# Patient Record
Sex: Male | Born: 1980 | Race: Asian | Hispanic: No | Marital: Single | Smoking: Current every day smoker
Health system: Southern US, Community
[De-identification: ages and names within clinical notes are randomized; demographics above are authoritative.]

## PROBLEM LIST (undated history)

## (undated) DIAGNOSIS — E119 Type 2 diabetes mellitus without complications: Secondary | ICD-10-CM

## (undated) DIAGNOSIS — E785 Hyperlipidemia, unspecified: Secondary | ICD-10-CM

## (undated) DIAGNOSIS — F200 Paranoid schizophrenia: Secondary | ICD-10-CM

## (undated) DIAGNOSIS — I10 Essential (primary) hypertension: Secondary | ICD-10-CM

## (undated) DIAGNOSIS — K5900 Constipation, unspecified: Secondary | ICD-10-CM

## (undated) HISTORY — DX: Hyperlipidemia, unspecified: E78.5

## (undated) HISTORY — DX: Essential (primary) hypertension: I10

## (undated) HISTORY — PX: NO PAST SURGERIES: SHX2092

---

## 2004-11-17 ENCOUNTER — Emergency Department (HOSPITAL_COMMUNITY): Admission: EM | Admit: 2004-11-17 | Discharge: 2004-11-17 | Payer: Self-pay | Admitting: *Deleted

## 2012-06-01 ENCOUNTER — Encounter (HOSPITAL_COMMUNITY): Payer: Self-pay

## 2012-06-01 ENCOUNTER — Emergency Department (HOSPITAL_COMMUNITY)
Admission: EM | Admit: 2012-06-01 | Discharge: 2012-06-01 | Disposition: A | Discharge status: Home / Self Care | Payer: Medicaid Other | Source: Home / Self Care | Attending: Emergency Medicine | Admitting: Emergency Medicine

## 2012-06-01 DIAGNOSIS — K529 Noninfective gastroenteritis and colitis, unspecified: Secondary | ICD-10-CM

## 2012-06-01 DIAGNOSIS — K219 Gastro-esophageal reflux disease without esophagitis: Secondary | ICD-10-CM

## 2012-06-01 MED ORDER — OMEPRAZOLE 20 MG PO CPDR: 40.0000 mg | DELAYED_RELEASE_CAPSULE | Freq: Two times a day (BID) | ORAL | Status: DC

## 2012-06-01 MED ORDER — ONDANSETRON 8 MG PO TBDP: 8.0000 mg | ORAL_TABLET | Freq: Three times a day (TID) | ORAL | Status: AC | PRN

## 2012-06-01 NOTE — ED Provider Notes (Addendum)
Chief Complaint  Patient presents with  . Abdominal Pain    History of Present Illness:    The patient is a 31 year old male with a 2 month history of indigestion, heartburn, and waterbrash. This is worse if he drinks sodas or eating certain foods such as bread. He denies any dysphagia or evidence of GI bleeding. He has mild epigastric pain but it's not severe. Over the past 2 days he's felt a little bit worse, feeling feverish and sweaty, nauseated, and dry heaves, diarrhea with up to 6 diarrheal stools per day which are somewhat dark. His epigastric pain feels a little bit worse. He's had no exposures to anyone with a similar illness, no suspicious ingestions, foreign travel, or animal exposures. He denies any history of ulcer disease, hiatal hernia, reflux esophagitis, gastritis, or gallbladder disease.  Review of Systems:  Other than noted above, the patient denies any of the following symptoms: Constitutional:  No fever, chills, fatigue, weight loss or anorexia. Lungs:  No cough or shortness of breath. Heart:  No chest pain, palpitations, syncope or edema.  No cardiac history. Abdomen:  No nausea, vomiting, hematememesis, melena, diarrhea, or hematochezia. GU:  No dysuria, frequency, urgency, or hematuria.  No testicular pain or swelling. Skin:  No rash or itching.  PMFSH:  Past medical history, family history, social history, meds, and allergies were reviewed along with nurse's notes.  No prior abdominal surgeries or history of GI problems.  No use of NSAIDs or aspirin.  No excessive  alcohol intake.  Physical Exam:   Vital signs:  BP 113/72  Pulse 91  Temp 98.5 F (36.9 C) (Oral)  Resp 16  SpO2 98% Gen:  Alert, oriented, in no distress. Lungs:  Breath sounds clear and equal bilaterally.  No wheezes, rales or rhonchi. Heart:  Regular rhythm.  No gallops or murmers.   Abdomen:  Abdomen is soft, flat, and nondistended. He has mild epigastric tenderness to palpation without guarding or  rebound. No organomegaly or mass. Bowel sounds are normally active. Murphy sign and Murphy's punch were negative. Rectal exam:  Rectal exam reveals no masses, normal prostate, no tenderness, and heme-negative stool. Skin:  Clear, warm and dry.  No rash.  Assessment:  The primary encounter diagnosis was GERD (gastroesophageal reflux disease). A diagnosis of Gastroenteritis was also pertinent to this visit. I think he has a chronic reflux with a superimposed acute gastroenteritis. I have urged that he followup with his primary care physician and given instructions about the brat diet and an antireflux regimen. I told him that his symptoms didn't improve after 6-8 weeks he may need to see a gastroenterologist.  Plan:   1.  The following meds were prescribed:   New Prescriptions   OMEPRAZOLE (PRILOSEC) 20 MG CAPSULE    Take 2 capsules (40 mg total) by mouth 2 (two) times daily before a meal.   ONDANSETRON (ZOFRAN ODT) 8 MG DISINTEGRATING TABLET    Take 1 tablet (8 mg total) by mouth every 8 (eight) hours as needed for nausea.   2.  The patient was instructed in symptomatic care and handouts were given. 3.  The patient was told to return if becoming worse in any way, if no better in 3 or 4 days, and given some red flag symptoms that would indicate earlier return.   Reuben Likes, MD 06/01/12 1253  Reuben Likes, MD 06/01/12 1254

## 2012-06-01 NOTE — ED Notes (Addendum)
C/o burning in epigastric area that is constant and aggravated by some foods (bread and soda)- Sx for 3 months.  States zantac makes it better.  In the last 2 days pt also having nausea, more frequent BMs and feeling fatigued.  Denies vomiting.

## 2012-06-01 NOTE — Discharge Instructions (Signed)
B.R.A.T. Diet Your doctor has recommended the B.R.A.T. diet for you or your child until the condition improves. This is often used to help control diarrhea and vomiting symptoms. If you or your child can tolerate clear liquids, you may have:  Bananas.   Rice.   Applesauce.   Toast (and other simple starches such as crackers, potatoes, noodles).  Be sure to avoid dairy products, meats, and fatty foods until symptoms are better. Fruit juices such as apple, grape, and prune juice can make diarrhea worse. Avoid these. Continue this diet for 2 days or as instructed by your caregiver. Document Released: 11/18/2005 Document Revised: 11/07/2011 Document Reviewed: 05/07/2007 [facility] Patient Information 2012 [place], [place].   Diet for GERD or PUD Nutrition therapy can help ease the discomfort of gastroesophageal reflux disease (GERD) and peptic ulcer disease (PUD).  HOME CARE INSTRUCTIONS   Eat your meals slowly, in a relaxed setting.   Eat 5 to 6 small meals per day.   If a food causes distress, stop eating it for a period of time.  FOODS TO AVOID  Coffee, regular or decaffeinated.   Cola beverages, regular or low calorie.   Tea, regular or decaffeinated.   Pepper.   Cocoa.   High fat foods, including meats.   Butter, margarine, hydrogenated oil (trans fats).   Peppermint or spearmint (if you have GERD).   Fruits and vegetables if not tolerated.   Alcohol.   Nicotine (smoking or chewing). This is one of the most potent stimulants to acid production in the gastrointestinal tract.   Any food that seems to aggravate your condition.  If you have questions regarding your diet, ask your caregiver or a registered dietitian. TIPS  Lying flat may make symptoms worse. Keep the head of your bed raised 6 to 9 inches (15 to 23 cm) by using a foam wedge or blocks under the legs of the bed.   Do not lay down until 3 hours after eating a meal.   Daily physical activity may help  reduce symptoms.  MAKE SURE YOU:   Understand these instructions.   Will watch your condition.   Will get help right away if you are not doing well or get worse.  Document Released: 11/18/2005 Document Revised: 11/07/2011 Document Reviewed: 10/04/2011 [facility] Patient Information 2012 [place], [place].

## 2020-03-06 ENCOUNTER — Emergency Department (HOSPITAL_BASED_OUTPATIENT_CLINIC_OR_DEPARTMENT_OTHER)
Admission: EM | Admit: 2020-03-06 | Discharge: 2020-03-07 | Disposition: A | Discharge status: Home / Self Care | Payer: Self-pay | Attending: Emergency Medicine | Admitting: Emergency Medicine

## 2020-03-06 ENCOUNTER — Other Ambulatory Visit: Payer: Self-pay

## 2020-03-06 ENCOUNTER — Encounter (HOSPITAL_BASED_OUTPATIENT_CLINIC_OR_DEPARTMENT_OTHER): Payer: Self-pay | Admitting: *Deleted

## 2020-03-06 DIAGNOSIS — Z79899 Other long term (current) drug therapy: Secondary | ICD-10-CM | POA: Insufficient documentation

## 2020-03-06 DIAGNOSIS — F309 Manic episode, unspecified: Secondary | ICD-10-CM

## 2020-03-06 DIAGNOSIS — F250 Schizoaffective disorder, bipolar type: Secondary | ICD-10-CM | POA: Insufficient documentation

## 2020-03-06 DIAGNOSIS — Z87891 Personal history of nicotine dependence: Secondary | ICD-10-CM | POA: Insufficient documentation

## 2020-03-06 DIAGNOSIS — Z20822 Contact with and (suspected) exposure to covid-19: Secondary | ICD-10-CM | POA: Insufficient documentation

## 2020-03-06 HISTORY — DX: Paranoid schizophrenia: F20.0

## 2020-03-06 LAB — URINALYSIS, ROUTINE W REFLEX MICROSCOPIC
Bilirubin Urine: NEGATIVE
Glucose, UA: NEGATIVE mg/dL
Ketones, ur: NEGATIVE mg/dL
Leukocytes,Ua: NEGATIVE
Nitrite: NEGATIVE
Protein, ur: NEGATIVE mg/dL
Specific Gravity, Urine: 1.015 (ref 1.005–1.030)
pH: 6 (ref 5.0–8.0)

## 2020-03-06 LAB — URINALYSIS, MICROSCOPIC (REFLEX): Squamous Epithelial / LPF: NONE SEEN (ref 0–5)

## 2020-03-06 LAB — RAPID URINE DRUG SCREEN, HOSP PERFORMED
Amphetamines: NOT DETECTED
Barbiturates: NOT DETECTED
Benzodiazepines: NOT DETECTED
Cocaine: NOT DETECTED
Opiates: NOT DETECTED
Tetrahydrocannabinol: NOT DETECTED

## 2020-03-06 MED ORDER — ZIPRASIDONE MESYLATE 20 MG IM SOLR
20.0000 mg | Freq: Once | INTRAMUSCULAR | Status: AC
  Administered 2020-03-06: 20 mg via INTRAMUSCULAR
  Filled 2020-03-06: qty 20

## 2020-03-06 MED ORDER — HALOPERIDOL 5 MG PO TABS
5.0000 mg | ORAL_TABLET | Freq: Once | ORAL | Status: AC
  Administered 2020-03-06: 5 mg via ORAL
  Filled 2020-03-06: qty 1

## 2020-03-06 MED ORDER — LORAZEPAM 2 MG/ML IJ SOLN
2.0000 mg | Freq: Once | INTRAMUSCULAR | Status: AC
  Administered 2020-03-06: 2 mg via INTRAMUSCULAR
  Filled 2020-03-06: qty 1

## 2020-03-06 NOTE — ED Notes (Signed)
1 bag of pt belongings put in cab by RT gym shorts, white tshirt & puma flip flops.

## 2020-03-06 NOTE — BH Assessment (Addendum)
Tele Assessment Note   Patient Name: Steven Richardson MRN: [id number] Referring Physician: Dr. Virgina Norfolk.  Location [facility]: [facility] [place]. Location of Provider: Behavioral Health TTS Department  Steven Richardson is an 39 y.o. male, who presents involuntary and unaccompanied to [facility]. Clinician asked the pt, "what brought you to the hospital?" Pt reported, his wife brought him to the hospital because he was thinking about weird stuff. Pt reported, he told his wife he wanted multiple wives but his wife objected. Pt reported, he promised his wife she will be his only wife. Pt reported, seeing things in the past but not currently. Pt denies, SI, HI, AVH, self-injurious behaviors and access to weapons.   Pt was IVC'd by EDP. Per IVC paperwork: "Patient with history of Schizophrenia at worsening manic symptoms hallucinations even with recent medication adjustment, [facility] ACT Team, recommended evaluation and likely admission for stabilization."   Pt denies, substance use. Pt's UDS is negative. Pt is linked to ACT Team for medication management. Pt listed Depakote as a medications he is prescribed. Pt reported, taking medications as prescribed. Per chart, pt has previous inpatient admissions.   Pt presents alert in scrubs with logical, coherent speech. Pt's eye contact was good. Pt's mood, affect was pleasant. Pt's thought process was circumstantial. Pt's judgement is partial. Pt's concentration is fair. Pt's insight and impulse control was poor. Pt reported, if discharged from [facility] he could contract for safety.   Diagnosis: Schizoaffective disorder, bipolar type [facility])  Past Medical History:  Past Medical History:  Diagnosis Date  . Paranoid schizophrenia (HCC)     History reviewed. No pertinent surgical history.  Family History: No family history on file.  Social History:  reports that he has quit smoking. He has never used smokeless tobacco. He  reports that he does not drink alcohol or use drugs.  Additional Social History:  Alcohol / Drug Use Pain Medications: See MAR Prescriptions: See MAR Over the Counter: See MAR History of alcohol / drug use?: No history of alcohol / drug abuse  CIWA: CIWA-Ar BP: 118/77 Pulse Rate: 97 COWS:    Allergies: No Known Allergies  Home Medications: (Not in a hospital admission)   OB/GYN Status:  No LMP for male patient.  General Assessment Data Assessment unable to be completed: Yes Reason for not completing assessment: IVC paperwork to be faxed. Once paperwork is received and reviewed clinician to engage pt in TTS assessment.  Location of Assessment: [facility] TTS Assessment: In system Is this a Tele or Face-to-Face Assessment?: Tele Assessment Is this an Initial Assessment or a Re-assessment for this encounter?: Initial Assessment Patient Accompanied by:: Adult Permission Given to speak with another: Yes Name, Relationship and Phone Number: Steven Richardson, mother, [phone]. Language Other than English: Yes What is your preferred language: Other (Comment: Enter the language)(Arabic.) Living Arrangements: Other (Comment)(Wife. ) What gender do you identify as?: Male Marital status: Married Living Arrangements: Spouse/significant other Can pt return to current living arrangement?: Yes Admission Status: Voluntary Is patient capable of signing voluntary admission?: Yes Referral Source: Self/Family/Friend Insurance type: Self-pay.     Crisis Care Plan Living Arrangements: Spouse/significant other Legal Guardian: (self.) Name of Psychiatrist: ACT Team Name of Therapist: NA  Education Status Is patient currently in school?: No Is the patient employed, unemployed or receiving disability?: Receiving disability income(Pending. )  Risk to self with the past 6 months Suicidal Ideation: No(Pt denies. ) Has patient  been a risk to self within the past 6 months prior  to admission? : No(Pt denies. ) Suicidal Intent: No Has patient had any suicidal intent within the past 6 months prior to admission? : No Is patient at risk for suicide?: No Suicidal Plan?: No Has patient had any suicidal plan within the past 6 months prior to admission? : No Access to Means: No What has been your use of drugs/alcohol within the last 12 months?: Pt denies use.  Previous Attempts/Gestures: Yes How many times?: (Unsure.) Other Self Harm Risks: Per IVC, manic symptoms, hallucinations Triggers for Past Attempts: (UTA) Intentional Self Injurious Behavior: None(Pt denies. ) Family Suicide History: Unable to assess Recent stressful life event(s): Other (Comment)(Pt denies, stressors. ) Persecutory voices/beliefs?: No(Pt denies. ) Depression: No(Pt denies. ) Substance abuse history and/or treatment for substance abuse?: No Suicide prevention information given to non-admitted patients: Not applicable  Risk to Others within the past 6 months Homicidal Ideation: No(Pt denies. ) Does patient have any lifetime risk of violence toward others beyond the six months prior to admission? : No Thoughts of Harm to Others: No Current Homicidal Intent: No Current Homicidal Plan: No Access to Homicidal Means: No Identified Victim: NA History of harm to others?: No Assessment of Violence: None Noted Violent Behavior Description: NA Does patient have access to weapons?: No Criminal Charges Pending?: No Does patient have a court date: No Is patient on probation?: No  Psychosis Hallucinations: Auditory Delusions: None noted  Mental Status Report Appearance/Hygiene: In scrubs Eye Contact: Good Motor Activity: Unremarkable Speech: Logical/coherent Level of Consciousness: Alert Mood: Pleasant Affect: Other (Comment)(pleasant. ) Anxiety Level: Moderate Thought Processes: Circumstantial Judgement: Partial Orientation: Person, Place, Time, Situation Obsessive Compulsive  Thoughts/Behaviors: None  Cognitive Functioning Concentration: Fair Memory: Recent Impaired Is patient IDD: No Insight: Poor Impulse Control: Poor Appetite: Good Sleep: (Depends.) Vegetative Symptoms: None  ADLScreening [facility] Assessment Services) Patient's cognitive ability adequate to safely complete daily activities?: Yes Patient able to express need for assistance with ADLs?: Yes Independently performs ADLs?: Yes (appropriate for developmental age)  Prior Inpatient Therapy Prior Inpatient Therapy: Yes Prior Therapy Dates: 01/2020 Prior Therapy Facilty/Provider(s): [facility].  Reason for Treatment: Schizoaffective disorder, bipolar type .  Prior Outpatient Therapy Prior Outpatient Therapy: Yes Prior Therapy Dates: Current.  Prior Therapy Facilty/Provider(s): [facility]. Reason for Treatment: Medication management. Does patient have an ACCT team?: No Does patient have Intensive In-House Services?  : No Does patient have [facility] services? : No Does patient have P4CC services?: No  ADL Screening (condition at time of admission) Patient's cognitive ability adequate to safely complete daily activities?: Yes Is the patient deaf or have difficulty hearing?: No Does the patient have difficulty seeing, even when wearing glasses/contacts?: No Does the patient have difficulty concentrating, remembering, or making decisions?: No Patient able to express need for assistance with ADLs?: Yes Does the patient have difficulty dressing or bathing?: No Independently performs ADLs?: Yes (appropriate for developmental age) Does the patient have difficulty walking or climbing stairs?: No Weakness of Legs: None Weakness of Arms/Hands: None  Home Assistive Devices/Equipment Home Assistive Devices/Equipment: None    Abuse/Neglect Assessment (Assessment to be complete while patient is alone) Abuse/Neglect Assessment Can Be Completed: Yes Physical Abuse: Denies Verbal Abuse:  Yes, past (Comment) Sexual Abuse: Denies Exploitation of patient/patient's resources: Denies Self-Neglect: Denies     Regulatory affairs officer (For Healthcare) Does Patient Have a Medical Advance Directive?: No          Disposition: Corene Cornea  Allyson Sabal, NP recommends inpatient treatment. Per Hassie Bruce, RN no appropriate beds available. TTS to seek placement. Disposition discussed with Dr. Clayborne Dana and Victorino Dike, RN.    Disposition Initial Assessment Completed for this Encounter: Yes  This service was provided via telemedicine using a 2-way, interactive audio and video technology.  Names of all persons participating in this telemedicine service and their role in this encounter. Name: Steven Richardson. Role: Patient.  Name: Redmond Pulling, MS, [facility], [facility]. Role: Counselor.          Redmond Pulling 03/06/2020 11:58 PM    Redmond Pulling, MS, [facility], [facility] Triage Specialist [phone]

## 2020-03-06 NOTE — ED Notes (Signed)
Pt sexually explicit towards this nurse, making gestures to get into bed with him and asking to kiss. Attempted to redirect with television.

## 2020-03-06 NOTE — B[facility] Counselor (Signed)
Clinician obtained consent to call pt's wife Wynelle Link, [phone]) to obtain collateral information hoever there was no answer. Clinician left a HIPPA compliant voice message.    Redmond Pulling, MS, [facility], [facility] Triage Specialist [phone].

## 2020-03-06 NOTE — ED Triage Notes (Signed)
Pt denies SI, HI. Attempted to take pt BP he states I dont want you to take it, when ED MD asks about pt history, he reports he does not want to talk, states "my history is not your concern". Pt. laughing  Pt states you can speak with my wife.

## 2020-03-06 NOTE — ED Notes (Addendum)
PT repeatedly asking to leave, making derogatory comments to staff, coming to doorway, ignoring requests. PT cursing, and talking to tv. PT stated multiple times he would touch staff but was verbally deescalated. PT has not been wanded or changed at this time due to ignoring requests from staff and no IVC papers at this time. PT requesting to leave or do what he needs to leave. MD, RN aware

## 2020-03-06 NOTE — ED Triage Notes (Signed)
Pt brought in by wife for AMS. Pt gives first name, reports he does not have a last name, when asked why he is here he reports that he is great. Denies any pain. ED MD at beside.

## 2020-03-06 NOTE — ED Notes (Signed)
His wife Evlyn Kanner can be reached at [phone]. She is waiting in the W/A.

## 2020-03-06 NOTE — ED Triage Notes (Signed)
He has been hearing voices since December. He is taking Depakote and lithium for schizophrenia. Wife states he is scaring his kids. He tells them he is God. He went to through the neighborhood today asking people if could come in their homes to "pee". He is very loud and demanding. No physical abuse.

## 2020-03-06 NOTE — ED Notes (Signed)
PT belongings collected and placed at the nurses station. PT asleep at this time and will be wanded by security as soon as the PT is awake. Rails up, bed in lowest position. Room secured per protocols.

## 2020-03-06 NOTE — ED Provider Notes (Signed)
[facility] EMERGENCY DEPARTMENT Provider Note   CSN: [id number] Arrival date & time: 03/06/20  1748     History Chief Complaint  Patient presents with  . Mental Health Problem    Steven Richardson is a 39 y.o. male.  Patient with history of schizophrenia.  Recent medication change several weeks ago due to having manic type behavior.  Patient not doing better.  Hyperreligious, hypersexual, some hallucinations.  Sent by his act team for possible admission.  Patient denies any suicidal homicidal ideation.  The history is provided by the patient and the spouse.  Mental Health Problem Presenting symptoms: disorganized speech, disorganized thought process and hallucinations   Patient accompanied by:  Family member Degree of incapacity (severity):  Moderate Onset quality:  Gradual Timing:  Constant Progression:  Worsening Chronicity:  Recurrent Context: not noncompliant   Treatment compliance:  All of the time Relieved by:  Nothing Worsened by:  Nothing Associated symptoms: no abdominal pain and no chest pain   Risk factors: hx of mental illness        Past Medical History:  Diagnosis Date  . Paranoid schizophrenia (HCC)     There are no problems to display for this patient.   History reviewed. No pertinent surgical history.     No family history on file.  Social History   Tobacco Use  . Smoking status: Former Games developer  . Smokeless tobacco: Never Used  Substance Use Topics  . Alcohol use: No  . Drug use: No    Home Medications Prior to Admission medications   Medication Sig Start Date End Date Taking? Authorizing Provider  lithium carbonate (LITHOBID) 300 MG CR tablet Take by mouth. 02/20/20 03/21/20 Yes [provider]  divalproex (DEPAKOTE ER) 500 MG 24 hr tablet Take 2,000 mg by mouth at bedtime. 01/31/20   [provider]  lithium carbonate (LITHOBID) 300 MG CR tablet Take 300 mg by mouth 2 (two) times daily. 02/21/20   [provider]  omeprazole (PRILOSEC) 20 MG capsule Take 2 capsules (40 mg total) by mouth 2 (two) times daily before a meal. 06/01/12 06/01/13  Reuben Likes, MD    Allergies    Patient has no known allergies.  Review of Systems   Review of Systems  Constitutional: Negative for chills and fever.  HENT: Negative for ear pain and sore throat.   Eyes: Negative for pain and visual disturbance.  Respiratory: Negative for cough and shortness of breath.   Cardiovascular: Negative for chest pain and palpitations.  Gastrointestinal: Negative for abdominal pain and vomiting.  Genitourinary: Negative for dysuria and hematuria.  Musculoskeletal: Negative for arthralgias and back pain.  Skin: Negative for color change and rash.  Neurological: Negative for seizures and syncope.  Psychiatric/Behavioral: Positive for decreased concentration and hallucinations. The patient is hyperactive.   All other systems reviewed and are negative.   Physical Exam Updated Vital Signs  ED Triage Vitals  Enc Vitals Group     BP 03/06/20 1837 (!) 131/114     Pulse Rate 03/06/20 1837 89     Resp 03/06/20 1837 (!) 21     Temp --      Temp src --      SpO2 03/06/20 1837 100 %     Weight 03/06/20 1804 280 lb (127 kg)     Height 03/06/20 1804 6\' 2"  (1.88 m)     Head Circumference --      Peak Flow --  Pain Score 03/06/20 1800 0     Pain Loc --      Pain Edu? --      Excl. in [place]? --     Physical Exam Vitals and nursing note reviewed.  Constitutional:      General: He is not in acute distress.    Appearance: He is well-developed. He is not ill-appearing.  HENT:     Head: Normocephalic and atraumatic.     Nose: Nose normal.     Mouth/Throat:     Mouth: Mucous membranes are moist.  Eyes:     Extraocular Movements: Extraocular movements intact.     Conjunctiva/sclera: Conjunctivae normal.     Pupils: Pupils are equal, round, and reactive to light.  Cardiovascular:     Rate and Rhythm: Normal rate  and regular rhythm.     Pulses: Normal pulses.     Heart sounds: Normal heart sounds. No murmur.  Pulmonary:     Effort: Pulmonary effort is normal. No respiratory distress.     Breath sounds: Normal breath sounds.  Abdominal:     Palpations: Abdomen is soft.     Tenderness: There is no abdominal tenderness.  Musculoskeletal:     Cervical back: Neck supple.  Skin:    General: Skin is warm and dry.  Neurological:     General: No focal deficit present.     Mental Status: He is alert and oriented to person, place, and time.     Cranial Nerves: No cranial nerve deficit.     Sensory: No sensory deficit.     Motor: No weakness.     Coordination: Coordination normal.     Gait: Gait normal.  Psychiatric:        Attention and Perception: He is inattentive.        Mood and Affect: Affect is labile.        Speech: Speech is tangential.        Behavior: Behavior is hyperactive.        Thought Content: Thought content does not include homicidal or suicidal ideation. Thought content does not include homicidal or suicidal plan.        Judgment: Judgment is impulsive.     ED Results / Procedures / Treatments   Labs (all labs ordered are listed, but only abnormal results are displayed) Labs Reviewed  URINALYSIS, ROUTINE W REFLEX MICROSCOPIC - Abnormal; Notable for the following components:      Result Value   Hgb urine dipstick TRACE (*)    All other components within normal limits  URINALYSIS, MICROSCOPIC (REFLEX) - Abnormal; Notable for the following components:   Bacteria, UA RARE (*)    All other components within normal limits  RESPIRATORY PANEL BY RT PCR (FLU A&B, COVID)  RAPID URINE DRUG SCREEN, HOSP PERFORMED  COMPREHENSIVE METABOLIC PANEL  ETHANOL  CBC WITH DIFFERENTIAL/PLATELET    EKG None  Radiology No results found.  Procedures Procedures (including critical care time)  Medications Ordered in ED Medications  haloperidol (HALDOL) tablet 5 mg (5 mg Oral Given  03/06/20 1847)  LORazepam (ATIVAN) injection 2 mg (2 mg Intramuscular Given 03/06/20 2000)  ziprasidone (GEODON) injection 20 mg (20 mg Intramuscular Given 03/06/20 2000)    ED Course  I have reviewed the triage vital signs and the nursing notes.  Pertinent labs & imaging results that were available during my care of the patient were reviewed by me and considered in my medical decision making (see chart for details).  MDM Rules/Calculators/A&P                      Steven Richardson is a 39 year old male with history of schizophrenia who presents to the ED with manic behavior.  Patient here with his wife.  Patient's act team recommended likely inpatient treatment.  Has been compliant with his medications.  Recent changes medication 3 weeks ago as he has not been doing well mentally.  However wife states that he continues to be hyperreligious, hypersexual, manic.  He exhibits these behaviors on exam as well.  Denies any suicidal homicidal ideation.  Patient was given IM Geodon and Ativan for agitation upon arrival for safety of the staff and patient.  Urine drug screen is negative.  Urinalysis unremarkable.  TTS consult has been placed.  IVC has been filled as patient has been trying to leave.  This chart was dictated using voice recognition software.  Despite best efforts to proofread,  errors can occur which can change the documentation meaning.    Final Clinical Impression(s) / ED Diagnoses Final diagnoses:  Mania [facility])    Rx / DC Orders ED Discharge Orders    None       Virgina Norfolk, DO 03/06/20 2056

## 2020-03-06 NOTE — ED Notes (Signed)
PT changed into scrubs, PT still awake and uncooperative but is not aggressive at this time

## 2020-03-06 NOTE — ED Notes (Signed)
[facility] recommended pt for inpatient placement, stated they would try to find appropriate placement for him.

## 2020-03-06 NOTE — [facility] Counselor (Signed)
IVC paperwork to be faxed. Once paperwork is received and reviewed clinician to engage pt in TTS assessment.    Redmond Pulling, MS, [facility], [facility] Triage Specialist [phone]

## 2020-03-06 NOTE — ED Notes (Signed)
Attempted to deescalate pt, continues to be verbally aggressive towards sitter & sexually explicit towards RN. Unable to redirect at this time. Is not becoming physical hitting things in room.

## 2020-03-07 LAB — RESPIRATORY PANEL BY RT PCR (FLU A&B, COVID)
Influenza A by PCR: NEGATIVE
Influenza B by PCR: NEGATIVE
SARS Coronavirus 2 by RT PCR: NEGATIVE

## 2020-03-07 NOTE — ED Notes (Signed)
TTS done 

## 2020-03-07 NOTE — ED Notes (Signed)
Introduced myself to the patient and advised him that I will be helping to take care of him. Also asked if I could get him anything? Pt replied we were going home.

## 2020-03-07 NOTE — ED Notes (Signed)
Patient is pleasantly ambulating by the door

## 2020-03-07 NOTE — ED Notes (Signed)
Patient is still pleasantly ambulating by the door

## 2020-03-07 NOTE — ED Notes (Signed)
Spoke with pt's wife and she will arrive around 16:00-17:00 to pick up pt.

## 2020-03-07 NOTE — ED Provider Notes (Signed)
Brief update note  39 year old male with history of schizophrenia presenting to ER due to manic type behavior.  Hyperreligious, hypersexual.  Sent by act team for possible admission.  IVC'd.  TTS evaluated patient overnight, recommended reeval next morning.  On reevaluation today, Rosario Adie NP reassessed, feels he has improved today and is stable for discharge.  Personally assessed patient, demonstrates odd behavior, nursing staff reported that he had inappropriate conversation towards male staff, flirting behavoir. He however remains redirectable, has expressed no thoughts of self harm or harm to others. I agree with TTS/Psych NP eval. Will rescind IVC. Patient to f/u out pt with his ACT team. To be discharged to his family.    Milagros Loll, MD 03/07/20 1431

## 2020-03-07 NOTE — ED Notes (Signed)
Pt continues to make inappropriate conversation toward male staff, flirting type behaviour.   Pt standing at door and then sitting in bed.  Pt laughing to himself in bed, talking to himself, "you're crazy".

## 2020-03-07 NOTE — ED Notes (Signed)
Pt's IVC has been rescinded. Left voicemail for pt's wife notifying her of pt's pending discharge.

## 2020-03-07 NOTE — ED Notes (Signed)
Assisted pt with getting dressed.

## 2020-03-07 NOTE — ED Notes (Signed)
Pt sitting outside of room and then walking around , was  Reminded that he had to stay  in room or seat outside room, pt given a drink

## 2020-03-07 NOTE — Consult Note (Signed)
Telepsych Consultation   Reason for Consult:  IVC'd Referring Physician:  EDP at [facility] Location of Patient: [facility]  Location of Provider: Behavioral Health TTS Department  Patient Identification: Steven Richardson MRN:  [id number] Principal Diagnosis: <principal problem not specified> Diagnosis:  Active Problems:   * No active hospital problems. *   Total Time spent with patient: 45 minutes  Subjective:   Steven Richardson is a 39 y.o. male patient admitted with bizzare behavior and IVC by his wife.  Per IVC paperwork " Patient with history of schizophrenia at worsening manic symptoms hallucinations even with recent medications adjustment, [facility] ACT team recommend evaluation and likely admission for stabilization. "  Patient seen today for re-evaluation. Patient states I am here because my wife brought me here. I went to the neighbors house to find a 2nd wife. Well not to find a 2nd wife, to find a girlfriend you know. My wife got mad said I was crazy and brought me here. Is there anything wrong with having a wife and a girlfriend? I will ask my mom she can tell me. It is best that I ask my wife first. "    Patient appears to be improved today. He is not as restless, pacing, or hyperverbal today. He remains inappropriate at times when speaking with Clinical research associate, however he is verbally redirectable and not intrusive at all. He denies suicidal ideations, homicidal ideations and or hallucinations. He denies any recent hospitalizations however per chart review patietn was discharged from [facility] on 03/21 (LOS 10 days).    HPI:  Steven Richardson is an 39 y.o. male, who presents involuntary and unaccompanied to [facility] [employer] [facility]. Clinician asked the pt, "what brought you to the hospital?" Pt reported, his wife brought him to the hospital because he was thinking about weird stuff. Pt reported, he told his wife he wanted multiple wives but his wife objected. Pt reported, he promised his wife she will be his only wife. Pt  reported, seeing things in the past but not currently. Pt denies, SI, HI, AVH, self-injurious behaviors and access to weapons.   Pt was IVC'd by EDP. Per IVC paperwork: "Patient with history of Schizophrenia at worsening manic symptoms hallucinations even with recent medication adjustment, [facility] ACT Team, recommended evaluation and likely admission for stabilization."   Pt denies, substance use. Pt's UDS is negative. Pt is linked to ACT Team for medication management. Pt listed Depakote as a medications he is prescribed. Pt reported, taking medications as prescribed. Per chart, pt has previous inpatient admissions.    Past Psychiatric History:  Outpatient : [facility] ACT team Inpatient: [facility],  Medications: Depakote 1000mg  po BID, Lithium 300mg  po BID , Paliperidone oral 12mg  po daily and Sustenna (3/19 last received)   Risk to Self: Suicidal Ideation: No(Pt denies. ) Suicidal Intent: No Is patient at risk for suicide?: No Suicidal Plan?: No Access to Means: No What has been your use of drugs/alcohol within the last 12 months?: Pt denies use.  How many times?: (Unsure.) Other Self Harm Risks: Per IVC, manic symptoms, hallucinations Triggers for Past Attempts: (UTA) Intentional Self Injurious Behavior: None(Pt denies. ) Risk to Others: Homicidal Ideation: No(Pt denies. ) Thoughts of Harm to Others: No Current Homicidal Intent: No Current Homicidal Plan: No Access to Homicidal Means: No Identified Victim: NA History of harm to others?: No Assessment of Violence: None Noted Violent Behavior Description: NA Does patient have access to weapons?: No Criminal Charges Pending?: No Does patient have a  court date: No Prior Inpatient Therapy: Prior Inpatient Therapy: Yes Prior Therapy Dates: 01/2020 Prior Therapy Facilty/Provider(s): [facility].  Reason for Treatment: Schizoaffective disorder, bipolar type . Prior Outpatient Therapy: Prior Outpatient Therapy: Yes Prior Therapy  Dates: Current.  Prior Therapy Facilty/Provider(s): [facility] Team. Reason for Treatment: Medication management. Does patient have an ACCT team?: No Does patient have Intensive In-House Services?  : No Does patient have [facility] services? : No Does patient have P4CC services?: No  Past Medical History:  Past Medical History:  Diagnosis Date  . Paranoid schizophrenia ([place])    History reviewed. No pertinent surgical history. Family History: No family history on file. Family Psychiatric  History: Denies Social History:  Social History   Substance and Sexual Activity  Alcohol Use No     Social History   Substance and Sexual Activity  Drug Use No    Social History   Socioeconomic History  . Marital status: Single    Spouse name: Not on file  . Number of children: Not on file  . Years of education: Not on file  . Highest education level: Not on file  Occupational History  . Not on file  Tobacco Use  . Smoking status: Former Research scientist (life sciences)  . Smokeless tobacco: Never Used  Substance and Sexual Activity  . Alcohol use: No  . Drug use: No  . Sexual activity: Not on file  Other Topics Concern  . Not on file  Social History Narrative  . Not on file   Social Determinants of Health   Financial Resource Strain:   . Difficulty of Paying Living Expenses:   Food Insecurity:   . Worried About Charity fundraiser in the Last Year:   . Arboriculturist in the Last Year:   Transportation Needs:   . Film/video editor (Medical):   Marland Kitchen Lack of Transportation (Non-Medical):   Physical Activity:   . Days of Exercise per Week:   . Minutes of Exercise per Session:   Stress:   . Feeling of Stress :   Social Connections:   . Frequency of Communication with Friends and Family:   . Frequency of Social Gatherings with Friends and Family:   . Attends Religious Services:   . Active Member of Clubs or Organizations:   . Attends Archivist Meetings:   Marland Kitchen Marital Status:    Additional  Social History:    Allergies:  No Known Allergies  Labs:  Results for orders placed or performed during the hospital encounter of 03/06/20 (from the past 48 hour(s))  Rapid urine drug screen (hospital performed)     Status: None   Collection Time: 03/06/20  6:57 PM  Result Value Ref Range   Opiates NONE DETECTED NONE DETECTED   Cocaine NONE DETECTED NONE DETECTED   Benzodiazepines NONE DETECTED NONE DETECTED   Amphetamines NONE DETECTED NONE DETECTED   Tetrahydrocannabinol NONE DETECTED NONE DETECTED   Barbiturates NONE DETECTED NONE DETECTED    Comment: (NOTE) DRUG SCREEN FOR MEDICAL PURPOSES ONLY.  IF CONFIRMATION IS NEEDED FOR ANY PURPOSE, NOTIFY LAB WITHIN 5 DAYS. LOWEST DETECTABLE LIMITS FOR URINE DRUG SCREEN Drug Class                     Cutoff (ng/mL) Amphetamine and metabolites    1000 Barbiturate and metabolites    200 Benzodiazepine                 027 Tricyclics and metabolites  300 Opiates and metabolites        300 Cocaine and metabolites        300 THC                            50 Performed at [facility], [address] [place], [place] [address]   Urinalysis, Routine w reflex microscopic     Status: Abnormal   Collection Time: 03/06/20  6:57 PM  Result Value Ref Range   Color, Urine YELLOW YELLOW   APPearance CLEAR CLEAR   Specific Gravity, Urine 1.015 1.005 - 1.030   pH 6.0 5.0 - 8.0   Glucose, UA NEGATIVE NEGATIVE mg/dL   Hgb urine dipstick TRACE (A) NEGATIVE   Bilirubin Urine NEGATIVE NEGATIVE   Ketones, ur NEGATIVE NEGATIVE mg/dL   Protein, ur NEGATIVE NEGATIVE mg/dL   Nitrite NEGATIVE NEGATIVE   Leukocytes,Ua NEGATIVE NEGATIVE    Comment: Performed at [facility], [address] [place], [place] [address]  Urinalysis, Microscopic (reflex)     Status: Abnormal   Collection Time: 03/06/20  6:57 PM  Result Value Ref Range   RBC / HPF 0-5 0 - 5 RBC/hpf   WBC, UA 0-5 0 - 5 WBC/hpf   Bacteria, UA RARE (A) NONE SEEN    Squamous Epithelial / LPF NONE SEEN 0 - 5    Comment: Performed at [facility], [address] [place], [place] [address]  Respiratory Panel by RT PCR (Flu A&B, Covid) - Nasopharyngeal Swab     Status: None   Collection Time: 03/07/20 12:32 AM   Specimen: Nasopharyngeal Swab  Result Value Ref Range   SARS Coronavirus 2 by RT PCR NEGATIVE NEGATIVE    Comment: (NOTE) SARS-CoV-2 target nucleic acids are NOT DETECTED. The SARS-CoV-2 RNA is generally detectable in upper respiratoy specimens during the acute phase of infection. The lowest concentration of SARS-CoV-2 viral copies this assay can detect is 131 copies/mL. A negative result does not preclude SARS-Cov-2 infection and should not be used as the sole basis for treatment or other patient management decisions. A negative result may occur with  improper specimen collection/handling, submission of specimen other than nasopharyngeal swab, presence of viral mutation(s) within the areas targeted by this assay, and inadequate number of viral copies (<131 copies/mL). A negative result must be combined with clinical observations, patient history, and epidemiological information. The expected result is Negative. Fact Sheet for Patients:  https://www.moore.com/ Fact Sheet for Healthcare Providers:  https://www.young.biz/ This test is not yet ap proved or cleared by the [place] FDA and  has been authorized for detection and/or diagnosis of SARS-CoV-2 by FDA under an Emergency Use Authorization (EUA). This EUA will remain  in effect (meaning this test can be used) for the duration of the COVID-19 declaration under Section 564(b)(1) of the Act, 21 U.S.C. section 360bbb-3(b)(1), unless the authorization is terminated or revoked sooner.    Influenza A by PCR NEGATIVE NEGATIVE   Influenza B by PCR NEGATIVE NEGATIVE    Comment: (NOTE) The Xpert Xpress SARS-CoV-2/FLU/RSV assay is intended as  an aid in  the diagnosis of influenza from Nasopharyngeal swab specimens and  should not be used as a sole basis for treatment. Nasal washings and  aspirates are unacceptable for Xpert Xpress SARS-CoV-2/FLU/RSV  testing. Fact Sheet for Patients: https://www.moore.com/ Fact Sheet for Healthcare Providers: https://www.young.biz/ This test is not yet approved or cleared by  the [employer] and  has been authorized for detection and/or diagnosis of SARS-CoV-2 by  FDA under an Emergency Use Authorization (EUA). This EUA will remain  in effect (meaning this test can be used) for the duration of the  Covid-19 declaration under Section 564(b)(1) of the Act, 21  U.S.C. section 360bbb-3(b)(1), unless the authorization is  terminated or revoked. Performed at [facility], [address] [place], [place] [address]     Medications:  No current facility-administered medications for this encounter.   Current Outpatient Medications  Medication Sig Dispense Refill  . lithium carbonate (LITHOBID) 300 MG CR tablet Take by mouth.    . divalproex (DEPAKOTE ER) 500 MG 24 hr tablet Take 2,000 mg by mouth at bedtime.    Marland Kitchen lithium carbonate (LITHOBID) 300 MG CR tablet Take 300 mg by mouth 2 (two) times daily.    Marland Kitchen omeprazole (PRILOSEC) 20 MG capsule Take 2 capsules (40 mg total) by mouth 2 (two) times daily before a meal. 60 capsule 1    Musculoskeletal: Strength & Muscle Tone: within normal limits Gait & Station: normal Patient leans: N/A  Psychiatric Specialty Exam: Physical Exam  Review of Systems  Blood pressure (!) 130/94, pulse 79, temperature 97.9 F (36.6 C), temperature source Oral, resp. rate 16, height 6\' 2"  (1.88 m), weight 127 kg, SpO2 100 %.Body mass index is 35.95 kg/m.  General Appearance: Fairly Groomed  Eye Contact:  Good  Speech:  Clear and Coherent and Normal Rate  Volume:  Normal  Mood:  Euthymic  Affect:  Appropriate,  Congruent and Flirtatious  Thought Process:  Coherent, Linear and Descriptions of Associations: Intact  Orientation:  Full (Time, Place, and Person)  Thought Content:  Logical and Rumination  Suicidal Thoughts:  No  Homicidal Thoughts:  No  Memory:  Immediate;   Good Recent;   Fair  Judgement:  Fair  Insight:  Shallow  Psychomotor Activity:  Normal  Concentration:  Concentration: Fair and Attention Span: Fair  Recall:  of Knowledge:  Fair  Language:  Fair  Akathisia:  No  Handed:  Right  AIMS (if indicated):     Assets:  Communication Skills Desire for Improvement  ADL's:  Intact  Cognition:  WNL  Sleep:        Treatment Plan Summary: Plan Psych clear and discharge back to his ACT team. He can continue his home medications at this time. Follow up with outpatient sevices [facility]. He is scheduled to receive his next LAI on 03/20/2020. At this time it is too early to administer. Patient has poor impulse control, however he has some fair judgement and insight.   Disposition: No evidence of imminent risk to self or others at present.   Patient does not meet criteria for psychiatric inpatient admission. Supportive therapy provided about ongoing stressors. Discussed crisis plan, support from social network, calling 911, coming to the Emergency Department, and calling Suicide Hotline.  This service was provided via telemedicine using a 2-way, interactive audio and video technology.  Names of all persons participating in this telemedicine service and their role in this encounter. Name: 03/22/2020 Role: PMHNP-BC  Name: Randol Hmedian Role: Patient     Caryn Bee, FNP 03/07/2020 1:06 PM

## 2020-03-07 NOTE — ED Notes (Signed)
Pt given oatmeal applesauce and jiuce for breakfast sitter arrived

## 2020-03-07 NOTE — Discharge Instructions (Signed)
Please follow-up with your primary psychiatric team in the outpatient setting.  If he develops any thoughts or actions of hurting himself, hurting others or other new concerning symptom, recommend return to ER for reassessment.

## 2020-03-07 NOTE — ED Notes (Signed)
Sitter relived for lunch. PT cooperative at this time. Given coke. PT in good spirits.

## 2020-03-18 ENCOUNTER — Other Ambulatory Visit: Payer: Self-pay

## 2020-03-18 ENCOUNTER — Encounter (HOSPITAL_COMMUNITY): Payer: Self-pay

## 2020-03-18 ENCOUNTER — Emergency Department (HOSPITAL_COMMUNITY)
Admission: EM | Admit: 2020-03-18 | Discharge: 2020-03-20 | Disposition: A | Discharge status: Other Acute Inpatient Hospital | Payer: Self-pay | Attending: Emergency Medicine | Admitting: Emergency Medicine

## 2020-03-18 DIAGNOSIS — F209 Schizophrenia, unspecified: Secondary | ICD-10-CM | POA: Insufficient documentation

## 2020-03-18 DIAGNOSIS — F23 Brief psychotic disorder: Secondary | ICD-10-CM | POA: Diagnosis present

## 2020-03-18 DIAGNOSIS — R4689 Other symptoms and signs involving appearance and behavior: Secondary | ICD-10-CM

## 2020-03-18 DIAGNOSIS — Z79899 Other long term (current) drug therapy: Secondary | ICD-10-CM | POA: Insufficient documentation

## 2020-03-18 DIAGNOSIS — Z87891 Personal history of nicotine dependence: Secondary | ICD-10-CM | POA: Insufficient documentation

## 2020-03-18 DIAGNOSIS — R443 Hallucinations, unspecified: Secondary | ICD-10-CM

## 2020-03-18 DIAGNOSIS — Z20822 Contact with and (suspected) exposure to covid-19: Secondary | ICD-10-CM | POA: Insufficient documentation

## 2020-03-18 LAB — SALICYLATE LEVEL: Salicylate Lvl: <7 mg/dL — ABNORMAL LOW (ref 7.0–30.0)

## 2020-03-18 LAB — RESPIRATORY PANEL BY RT PCR (FLU A&B, COVID)
Influenza A by PCR: NEGATIVE
Influenza B by PCR: NEGATIVE
SARS Coronavirus 2 by RT PCR: NEGATIVE

## 2020-03-18 LAB — BASIC METABOLIC PANEL
Anion gap: 13 (ref 5–15)
BUN: 12 mg/dL (ref 6–20)
CO2: 21 mmol/L — ABNORMAL LOW (ref 22–32)
Calcium: 9.2 mg/dL (ref 8.9–10.3)
Chloride: 102 mmol/L (ref 98–111)
Creatinine, Ser: 0.77 mg/dL (ref 0.61–1.24)
GFR calc Af Amer: >60 mL/min (ref >60)
GFR calc non Af Amer: >60 mL/min (ref >60)
Glucose, Bld: 173 mg/dL — ABNORMAL HIGH (ref 70–99)
Potassium: 3.9 mmol/L (ref 3.5–5.1)
Sodium: 136 mmol/L (ref 135–145)

## 2020-03-18 LAB — CBC WITH DIFFERENTIAL/PLATELET
Abs Immature Granulocytes: 0.06 10*3/uL (ref 0.00–0.07)
Basophils Absolute: 0 10*3/uL (ref 0.0–0.1)
Basophils Relative: 0 %
Eosinophils Absolute: 0.3 10*3/uL (ref 0.0–0.5)
Eosinophils Relative: 3 %
HCT: 48.5 % (ref 39.0–52.0)
Hemoglobin: 17.1 g/dL — ABNORMAL HIGH (ref 13.0–17.0)
Immature Granulocytes: 1 %
Lymphocytes Relative: 33 %
Lymphs Abs: 3.6 10*3/uL (ref 0.7–4.0)
MCH: 30.9 pg (ref 26.0–34.0)
MCHC: 35.3 g/dL (ref 30.0–36.0)
MCV: 87.7 fL (ref 80.0–100.0)
Monocytes Absolute: 0.6 10*3/uL (ref 0.1–1.0)
Monocytes Relative: 5 %
Neutro Abs: 6.3 10*3/uL (ref 1.7–7.7)
Neutrophils Relative %: 58 %
Platelets: 259 10*3/uL (ref 150–400)
RBC: 5.53 MIL/uL (ref 4.22–5.81)
RDW: 11.9 % (ref 11.5–15.5)
WBC: 10.9 10*3/uL — ABNORMAL HIGH (ref 4.0–10.5)
nRBC: 0 % (ref 0.0–0.2)

## 2020-03-18 LAB — RAPID URINE DRUG SCREEN, HOSP PERFORMED
Amphetamines: NOT DETECTED
Barbiturates: NOT DETECTED
Benzodiazepines: NOT DETECTED
Cocaine: NOT DETECTED
Opiates: NOT DETECTED
Tetrahydrocannabinol: NOT DETECTED

## 2020-03-18 LAB — ETHANOL: Alcohol, Ethyl (B): <10 mg/dL (ref <10)

## 2020-03-18 LAB — ACETAMINOPHEN LEVEL: Acetaminophen (Tylenol), Serum: <10 ug/mL — ABNORMAL LOW (ref 10–30)

## 2020-03-18 MED ORDER — STERILE WATER FOR INJECTION IJ SOLN
INTRAMUSCULAR | Status: AC
  Filled 2020-03-18: qty 10

## 2020-03-18 MED ORDER — MELATONIN 3 MG PO TABS
3.0000 mg | ORAL_TABLET | Freq: Every day | ORAL | Status: DC
  Administered 2020-03-18: 3 mg via ORAL
  Filled 2020-03-18: qty 1

## 2020-03-18 MED ORDER — ZIPRASIDONE MESYLATE 20 MG IM SOLR
20.0000 mg | Freq: Once | INTRAMUSCULAR | Status: AC
  Administered 2020-03-18: 20 mg via INTRAMUSCULAR
  Filled 2020-03-18: qty 20

## 2020-03-18 NOTE — ED Triage Notes (Signed)
Patient denies any SI/HI. Patient states, "My wife wanted me to come. Patient would not elaborate about why he was here. Patient stated, "You can come to my house and get the questions."

## 2020-03-18 NOTE — ED Provider Notes (Signed)
[facility]-EMERGENCY DEPT Provider Note   CSN: [id number] Arrival date & time: 03/18/20  1205     History Chief Complaint  Patient presents with  . Medical Clearance    Steven Richardson is a 39 y.o. male presenting for evaluation of abnormal behavior.  Patient brought in by his wife, who is concerned about his behavior.  Patient denies any abnormality at this time.  Patient's wife states he is not acting like himself.  He is hallucinating, seeing and hearing things.  He is acting very aggressively towards her children, called his 78-year-old daughter a bitch earlier today.  Patient's wife states he is telling their children that she is off sleeping with other men.  She reports he has a history of schizophrenia, is taking his medications but his behavior is not improving.  Patient denies SI or HI at this time.  Reports auditory and visual hallucinations, states they are not threatening.  HPI     Past Medical History:  Diagnosis Date  . Paranoid schizophrenia (HCC)     There are no problems to display for this patient.   History reviewed. No pertinent surgical history.     Family History  Problem Relation Age of Onset  . Healthy Mother   . Healthy Father     Social History   Tobacco Use  . Smoking status: Former Games developer  . Smokeless tobacco: Never Used  Substance Use Topics  . Alcohol use: No  . Drug use: No    Home Medications Prior to Admission medications   Medication Sig Start Date End Date Taking? Authorizing Provider  divalproex (DEPAKOTE ER) 500 MG 24 hr tablet Take 2,000 mg by mouth at bedtime. 01/31/20   [provider]  lithium carbonate (LITHOBID) 300 MG CR tablet Take by mouth. 02/20/20 03/21/20  [provider]  lithium carbonate (LITHOBID) 300 MG CR tablet Take 300 mg by mouth 2 (two) times daily. 02/21/20   [provider]  omeprazole (PRILOSEC) 20 MG capsule Take 2 capsules (40 mg total) by mouth 2  (two) times daily before a meal. 06/01/12 06/01/13  Reuben Likes, MD    Allergies    Patient has no known allergies.  Review of Systems   Review of Systems  Unable to perform ROS: Psychiatric disorder  Psychiatric/Behavioral: Positive for behavioral problems and hallucinations.    Physical Exam Updated Vital Signs BP (!) 147/94 (BP Location: Left Arm)   Pulse (!) 105   Temp 98 F (36.7 C) (Oral)   Resp 18   Ht 5\' 11"  (1.803 m)   Wt 124.7 kg   SpO2 98%   BMI 38.35 kg/m   Physical Exam Vitals and nursing note reviewed.  Constitutional:      General: He is not in acute distress.    Appearance: He is well-developed.     Comments: Appears nontoxic  HENT:     Head: Normocephalic and atraumatic.  Eyes:     Conjunctiva/sclera: Conjunctivae normal.     Pupils: Pupils are equal, round, and reactive to light.  Cardiovascular:     Rate and Rhythm: Normal rate and regular rhythm.  Pulmonary:     Effort: Pulmonary effort is normal. No respiratory distress.     Breath sounds: Normal breath sounds. No wheezing.  Abdominal:     General: There is no distension.     Palpations: Abdomen is soft. There is no mass.     Tenderness: There is no abdominal tenderness. There is  no guarding or rebound.  Musculoskeletal:        General: Normal range of motion.     Cervical back: Normal range of motion and neck supple.  Skin:    General: Skin is warm and dry.  Neurological:     Mental Status: He is alert and oriented to person, place, and time.  Psychiatric:        Attention and Perception: He perceives auditory and visual hallucinations.        Speech: Speech is rapid and pressured and tangential.        Behavior: Behavior is aggressive and hyperactive.     Comments: Responding to internal stimuli.  Hypersexual and hyperreligious.  Tangential thoughts.  Aggressive/hyperactive behavior     ED Results / Procedures / Treatments   Labs (all labs ordered are listed, but only abnormal  results are displayed) Labs Reviewed  CBC WITH DIFFERENTIAL/PLATELET - Abnormal; Notable for the following components:      Result Value   WBC 10.9 (*)    Hemoglobin 17.1 (*)    All other components within normal limits  BASIC METABOLIC PANEL - Abnormal; Notable for the following components:   CO2 21 (*)    Glucose, Bld 173 (*)    All other components within normal limits  SALICYLATE LEVEL - Abnormal; Notable for the following components:   Salicylate Lvl <0.6 (*)    All other components within normal limits  ACETAMINOPHEN LEVEL - Abnormal; Notable for the following components:   Acetaminophen (Tylenol), Serum <10 (*)    All other components within normal limits  RESPIRATORY PANEL BY RT PCR (FLU A&B, COVID)  ETHANOL  RAPID URINE DRUG SCREEN, HOSP PERFORMED    EKG None  Radiology No results found.  Procedures Procedures (including critical care time)  Medications Ordered in ED Medications - No data to display  ED Course  I have reviewed the triage vital signs and the nursing notes.  Pertinent labs & imaging results that were available during my care of the patient were reviewed by me and considered in my medical decision making (see chart for details).    MDM Rules/Calculators/A&P                      Patient brought into the ED by his wife for abnormal and aggressive behavior.  On exam, patient is responding to internal stimuli.  Hypersexual and hyperreligious.  Aggressive and hyperactive behavior.  Will obtain medical clearance screening labs and consult with TTS.  Labs overall reassuring, patient is medically cleared at this time.  TTS evaluated the patient, recommends inpatient.  Patient does not want to stay, but I believe he needs to be admitted and stabilized.  IVC paperwork completed.  Final Clinical Impression(s) / ED Diagnoses Final diagnoses:  Abnormal behavior  Hallucinations    Rx / DC Orders ED Discharge Orders    None       Franchot Heidelberg,  PA-C 03/18/20 1619    Virgel Manifold, MD 03/22/20 [phone]

## 2020-03-18 NOTE — ED Provider Notes (Signed)
Notified that patient is not being cooperative.  He states he is ready to leave.  I explained to the patient that he was placed under involuntary commitment.  Patient does not believe that this long exists.  He also states were all just friends here.  I confirm with the patient that he is under IVC.  I will order dose of Hubert Azure, MD 03/18/20 1656

## 2020-03-18 NOTE — ED Notes (Signed)
Pt wants to leave.  He was told that he is under involuntary commitment.  He does not believe me and continues to refuse to change clothes.
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
## 2020-03-18 NOTE — BH Assessment (Signed)
[facility] Assessment Progress Note  Case was staffed with Arvilla Market NP who recommended a inpatient admission. IVC is also in the process of being initiated.

## 2020-03-18 NOTE — ED Notes (Signed)
Pt arrived to unit.  He refuses to change into scrubs as he thinks he will be leaving soon.  He did provide a urine sample.

## 2020-03-18 NOTE — Progress Notes (Signed)
Patient meets criteria for inpatient treatment per Ophelia Shoulder, NP. No appropriate beds at [facility] currently. CSW faxed referrals to the following facilities for review:  [facility]   [facility]   [facility]   [facility]  [facility]  [facility]  [facility]   [facility]  [facility]   [facility]   [facility]   [facility]   [facility]   [facility]  [facility]  [facility]    TTS will continue to seek bed placement.     Ruthann Cancer MSW, [facility] Social Worker Disposition  [facility] Ph: [phone] Fax: [phone] 03/18/2020 3:58 PM

## 2020-03-19 DIAGNOSIS — F209 Schizophrenia, unspecified: Secondary | ICD-10-CM | POA: Diagnosis present

## 2020-03-19 DIAGNOSIS — F23 Brief psychotic disorder: Secondary | ICD-10-CM | POA: Diagnosis present

## 2020-03-19 LAB — VALPROIC ACID LEVEL: Valproic Acid Lvl: 16 ug/mL — ABNORMAL LOW (ref 50.0–100.0)

## 2020-03-19 LAB — LITHIUM LEVEL: Lithium Lvl: <0.06 mmol/L — ABNORMAL LOW (ref 0.60–1.20)

## 2020-03-19 MED ORDER — ALUM & MAG HYDROXIDE-SIMETH 200-200-20 MG/5ML PO SUSP
30.0000 mL | Freq: Four times a day (QID) | ORAL | Status: DC | PRN
  Administered 2020-03-19: 30 mL via ORAL
  Filled 2020-03-19: qty 30

## 2020-03-19 MED ORDER — ONDANSETRON HCL 4 MG PO TABS: 4.0000 mg | ORAL_TABLET | Freq: Three times a day (TID) | ORAL | Status: DC | PRN

## 2020-03-19 MED ORDER — LORAZEPAM 1 MG PO TABS: 1.0000 mg | ORAL_TABLET | Freq: Four times a day (QID) | ORAL | Status: DC | PRN

## 2020-03-19 MED ORDER — ACETAMINOPHEN 325 MG PO TABS: 650.0000 mg | ORAL_TABLET | ORAL | Status: DC | PRN

## 2020-03-19 MED ORDER — LORAZEPAM 2 MG/ML IJ SOLN: 2.0000 mg | Freq: Once | INTRAMUSCULAR | Status: DC

## 2020-03-19 NOTE — ED Notes (Signed)
Pt is standing in hallway. States that he does not want to go to sleep or take any meds at this time

## 2020-03-19 NOTE — ED Notes (Signed)
Has been sitting in the doorway of his room most of the day but went to his room and allowed EKG; allowed lab draw. Refused IM injection, but contracted to stay in his room instead of having it forced.  He did understand that he would be restrained and the medication forced if he does not stay in his room for a while and rest. He is currently in bed resting.

## 2020-03-19 NOTE — ED Provider Notes (Addendum)
Notified that pt is agitated.  Ativan recommended by psychiatry team.  Will also add on valproic and lithium levels  EKG Interpretation  Date/Time:  Sunday March 19 2020 17:43:44 EDT Ventricular Rate:  105 PR Interval:  156 QRS Duration: 82 QT Interval:  340 QTC Calculation: 449 R Axis:   -6 Text Interpretation: Sinus tachycardia Otherwise normal ECG No old tracing to compare Confirmed by Linwood Dibbles [phone]) on 03/19/2020 6:10:05 PM         Linwood Dibbles, MD 03/19/20 1810

## 2020-03-19 NOTE — ED Notes (Signed)
Pt currently staying in his room sitting on his bed.

## 2020-03-19 NOTE — Consult Note (Addendum)
Telepsych Consultation   Reason for Consult:  Evaluate psychosis Referring Physician:  EDP Location of Patient: [facility] Location of Provider: [facility]  Patient Identification: Steven Richardson MRN:  [id number] Principal Diagnosis: Schizophrenia, history of multiple episodes, currently acute [facility](HCC) Diagnosis:  Principal Problem:   Schizophrenia, history of multiple episodes, currently acute [facility](HCC)   Tele Assessment Steven Richardson, 39 y.o., male patient presented to [facility] voluntarily with his spouse for evaluation of audible and visual hallucinations and bizarre behaviors .  Patient seen via telepsych by this provider; chart reviewed and consulted with Dr. Lucianne MussKumar on 03/19/20.  On evaluation Steven Richardson reports "I am always in the hospital.  I found love her at the hospital, with my brother's daughter".  When asked for clarification of his statement he states, "I mean friendship, I am friends with everyone".   When asked how he slept, he states, " I no longer have to sleep, I was given permission from God to never sleep again".    Per collateral information previously received from the nurse, the patient is restless, maniac and has not slept since admission yesterday.  His medication list includes Paliperidone 234mg /1.895ml; lithium carbonate 300mg  qhs; and divalproex 1000mg  BID.  Patient spouse administers medications,per nursing staff she verifies last dose was yesterday and denies missed dosages.   Although his communication is nonsensical, he appears more calm and now responds to verbal redirection.  He continues to make hyperreligious and hypersexual statements during the assessment.  At one point, he referred to this writer as "baby" and made other inappropriate comments requiring redirection.      Total Time spent with patient: 30 minutes   Past Psychiatric History:   Risk to Self: Suicidal Ideation: No Suicidal Intent: No Is  patient at risk for suicide?: No Suicidal Plan?: No Access to Means: No What has been your use of drugs/alcohol within the last 12 months?: Denies How many times?: 0 Other Self Harm Risks: (Increased MH symptoms) Triggers for Past Attempts: (UNK) Intentional Self Injurious Behavior: None Risk to Others: Homicidal Ideation: No Thoughts of Harm to Others: No Current Homicidal Intent: No Current Homicidal Plan: No Access to Homicidal Means: No Identified Victim: NA History of harm to others?: Yes Assessment of Violence: On admission Violent Behavior Description: Threats to family Does patient have access to weapons?: No Criminal Charges Pending?: No Does patient have a court date: No Prior Inpatient Therapy: Prior Inpatient Therapy: Yes Prior Therapy Dates: 2021 Prior [facility]ilty[facility]Provider[facility] Reason for Treatment: MH issues Prior Outpatient Therapy: Prior Outpatient Therapy: Yes Prior Therapy Dates: Ongoing Prior Therapy Facilty/Provider([facility]): [facility] Reason for Treatment: Med mang Does patient have an ACCT team?: No Does patient have Intensive In-House Services?  : No Does patient have [facility] services? : No Does patient have P4CC services?: No  Past Medical History:  Past Medical History:  Diagnosis Date  . Paranoid schizophrenia (HCC)    History reviewed. No pertinent surgical history. Family History:  Family History  Problem Relation Age of Onset  . Healthy Mother   . Healthy Father    Family Psychiatric  History:  Social History:  Social History   Substance and Sexual Activity  Alcohol Use No     Social History   Substance and Sexual Activity  Drug Use No    Social History   Socioeconomic History  . Marital status: Single    Spouse name: Not on file  . Number of children:  Not on file  . Years of education: Not on file  . Highest education level: Not on file  Occupational History  . Not on file  Tobacco Use  . Smoking status: Former  Games developer  . Smokeless tobacco: Never Used  Substance and Sexual Activity  . Alcohol use: No  . Drug use: No  . Sexual activity: Not on file  Other Topics Concern  . Not on file  Social History Narrative  . Not on file   Social Determinants of Health   Financial Resource Strain:   . Difficulty of Paying Living Expenses:   Food Insecurity:   . Worried About Programme researcher, broadcasting/film/video in the Last Year:   . Barista in the Last Year:   Transportation Needs:   . Freight forwarder (Medical):   Marland Kitchen Lack of Transportation (Non-Medical):   Physical Activity:   . Days of Exercise per Week:   . Minutes of Exercise per Session:   Stress:   . Feeling of Stress :   Social Connections:   . Frequency of Communication with Friends and Family:   . Frequency of Social Gatherings with Friends and Family:   . Attends Religious Services:   . Active Member of Clubs or Organizations:   . Attends Banker Meetings:   Marland Kitchen Marital Status:    Additional Social History:    Allergies:  No Known Allergies  Labs:  Results for orders placed or performed during the hospital encounter of 03/18/20 (from the past 48 hour(s))  Rapid urine drug screen (hospital performed)     Status: None   Collection Time: 03/18/20 12:26 PM  Result Value Ref Range   Opiates NONE DETECTED NONE DETECTED   Cocaine NONE DETECTED NONE DETECTED   Benzodiazepines NONE DETECTED NONE DETECTED   Amphetamines NONE DETECTED NONE DETECTED   Tetrahydrocannabinol NONE DETECTED NONE DETECTED   Barbiturates NONE DETECTED NONE DETECTED    Comment: (NOTE) DRUG SCREEN FOR MEDICAL PURPOSES ONLY.  IF CONFIRMATION IS NEEDED FOR ANY PURPOSE, NOTIFY LAB WITHIN 5 DAYS. LOWEST DETECTABLE LIMITS FOR URINE DRUG SCREEN Drug Class                     Cutoff (ng/mL) Amphetamine and metabolites    1000 Barbiturate and metabolites    200 Benzodiazepine                 200 Tricyclics and metabolites     300 Opiates and metabolites         300 Cocaine and metabolites        300 THC                            50 Performed at [facility], [address] [place], [place] [address]   CBC with Differential     Status: Abnormal   Collection Time: 03/18/20 12:46 PM  Result Value Ref Range   WBC 10.9 (H) 4.0 - 10.5 K/uL   RBC 5.53 4.22 - 5.81 MIL/uL   Hemoglobin 17.1 (H) 13.0 - 17.0 g/dL   HCT 16.9 67.8 - 93.8 %   MCV 87.7 80.0 - 100.0 fL   MCH 30.9 26.0 - 34.0 pg   MCHC 35.3 30.0 - 36.0 g/dL   RDW 10.1 75.1 - 02.5 %   Platelets 259 150 - 400 K/uL   nRBC 0.0 0.0 - 0.2 %   Neutrophils  Relative % 58 %   Neutro Abs 6.3 1.7 - 7.7 K/uL   Lymphocytes Relative 33 %   Lymphs Abs 3.6 0.7 - 4.0 K/uL   Monocytes Relative 5 %   Monocytes Absolute 0.6 0.1 - 1.0 K/uL   Eosinophils Relative 3 %   Eosinophils Absolute 0.3 0.0 - 0.5 K/uL   Basophils Relative 0 %   Basophils Absolute 0.0 0.0 - 0.1 K/uL   Immature Granulocytes 1 %   Abs Immature Granulocytes 0.06 0.00 - 0.07 K/uL    Comment: Performed at [facility], [address], [place], [place] [address]  Basic metabolic panel     Status: Abnormal   Collection Time: 03/18/20 12:46 PM  Result Value Ref Range   Sodium 136 135 - 145 mmol/L   Potassium 3.9 3.5 - 5.1 mmol/L   Chloride 102 98 - 111 mmol/L   CO2 21 (L) 22 - 32 mmol/L   Glucose, Bld 173 (H) 70 - 99 mg/dL    Comment: Glucose reference range applies only to samples taken after fasting for at least 8 hours.   BUN 12 6 - 20 mg/dL   Creatinine, Ser 5.09 0.61 - 1.24 mg/dL   Calcium 9.2 8.9 - 32.6 mg/dL   GFR calc non Af Amer >60 >60 mL/min   GFR calc Af Amer >60 >60 mL/min   Anion gap 13 5 - 15    Comment: Performed at [facility], [address], [place], [place] [address]  Ethanol     Status: None   Collection Time: 03/18/20 12:46 PM  Result Value Ref Range   Alcohol, Ethyl (B) <10 <10 mg/dL    Comment: (NOTE) Lowest detectable limit for serum alcohol  is 10 mg/dL. For medical purposes only. Performed at [facility], [address], [place], [place] [address]   Salicylate level     Status: Abnormal   Collection Time: 03/18/20 12:46 PM  Result Value Ref Range   Salicylate Lvl <7.0 (L) 7.0 - 30.0 mg/dL    Comment: Performed at [facility], [address] [place], [place] [address]  Acetaminophen level     Status: Abnormal   Collection Time: 03/18/20 12:46 PM  Result Value Ref Range   Acetaminophen (Tylenol), Serum <10 (L) 10 - 30 ug/mL    Comment: (NOTE) Therapeutic concentrations vary significantly. A range of 10-30 ug/mL  may be an effective concentration for many patients. However, some  are best treated at concentrations outside of this range. Acetaminophen concentrations >150 ug/mL at 4 hours after ingestion  and >50 ug/mL at 12 hours after ingestion are often associated with  toxic reactions. Performed at [facility], [address] [place], [place] [address]   Respiratory Panel by RT PCR (Flu A&B, Covid) - Nasopharyngeal Swab     Status: None   Collection Time: 03/18/20  3:14 PM   Specimen: Nasopharyngeal Swab  Result Value Ref Range   SARS Coronavirus 2 by RT PCR NEGATIVE NEGATIVE    Comment: (NOTE) SARS-CoV-2 target nucleic acids are NOT DETECTED. The SARS-CoV-2 RNA is generally detectable in upper respiratoy specimens during the acute phase of infection. The lowest concentration of SARS-CoV-2 viral copies this assay can detect is 131 copies/mL. A negative result does not preclude SARS-Cov-2 infection and should not be used as the sole basis for treatment or other patient management decisions. A negative result may occur with  improper specimen collection/handling, submission of specimen other  than nasopharyngeal swab, presence of viral mutation(s) within the areas targeted by this assay, and inadequate number of viral copies (<131 copies/mL). A negative  result must be combined with clinical observations, patient history, and epidemiological information. The expected result is Negative. Fact Sheet for Patients:  https://www.moore.com/ Fact Sheet for Healthcare Providers:  https://www.young.biz/ This test is not yet ap proved or cleared by the [place] FDA and  has been authorized for detection and/or diagnosis of SARS-CoV-2 by FDA under an Emergency Use Authorization (EUA). This EUA will remain  in effect (meaning this test can be used) for the duration of the COVID-19 declaration under Section 564(b)(1) of the Act, 21 U.S.C. section 360bbb-3(b)(1), unless the authorization is terminated or revoked sooner.    Influenza A by PCR NEGATIVE NEGATIVE   Influenza B by PCR NEGATIVE NEGATIVE    Comment: (NOTE) The Xpert Xpress SARS-CoV-2/FLU/RSV assay is intended as an aid in  the diagnosis of influenza from Nasopharyngeal swab specimens and  should not be used as a sole basis for treatment. Nasal washings and  aspirates are unacceptable for Xpert Xpress SARS-CoV-2/FLU/RSV  testing. Fact Sheet for Patients: https://www.moore.com/ Fact Sheet for Healthcare Providers: https://www.young.biz/ This test is not yet approved or cleared by the [place] FDA and  has been authorized for detection and/or diagnosis of SARS-CoV-2 by  FDA under an Emergency Use Authorization (EUA). This EUA will remain  in effect (meaning this test can be used) for the duration of the  Covid-19 declaration under Section 564(b)(1) of the Act, 21  U.S.C. section 360bbb-3(b)(1), unless the authorization is  terminated or revoked. Performed at [facility], [address] [place], [place] [address]     Medications:  Current Facility-Administered Medications  Medication Dose Route Frequency Provider Last Rate Last Admin  . acetaminophen (TYLENOL) tablet 650 mg   650 mg Oral Q4H PRN Dione Booze, MD      . alum & mag hydroxide-simeth (MAALOX/MYLANTA) 200-200-20 MG/5ML suspension 30 mL  30 mL Oral Q6H PRN Dione Booze, MD   30 mL at 03/19/20 0311  . LORazepam (ATIVAN) tablet 1 mg  1 mg Oral Q6H PRN Linwood Dibbles, MD      . melatonin tablet 3 mg  3 mg Oral QHS Linwood Dibbles, MD   3 mg at 03/18/20 2033  . ondansetron (ZOFRAN) tablet 4 mg  4 mg Oral Q8H PRN Dione Booze, MD       Current Outpatient Medications  Medication Sig Dispense Refill  . divalproex (DEPAKOTE ER) 500 MG 24 hr tablet Take 1,000 mg by mouth 2 (two) times daily.     Marland Kitchen lithium carbonate (LITHOBID) 300 MG CR tablet Take 300 mg by mouth at bedtime.     . paliperidone (INVEGA SUSTENNA) 234 MG/1.5ML SUSY injection Inject 1.5 mLs into the muscle every 30 (thirty) days.    Marland Kitchen omeprazole (PRILOSEC) 20 MG capsule Take 2 capsules (40 mg total) by mouth 2 (two) times daily before a meal. (Patient not taking: Reported on 03/18/2020) 60 capsule 1    Musculoskeletal: Strength & Muscle Tone: within normal limits Gait & Station: normal Patient leans: N/A  Psychiatric Specialty Exam: Physical Exam  HENT:  Head: Normocephalic.  Musculoskeletal:     Cervical back: Normal range of motion.  Neurological: He is alert.    Review of Systems  Psychiatric/Behavioral: Positive for behavioral problems, hallucinations and sleep disturbance. The patient is nervous/anxious and is hyperactive.     Blood pressure (!) 129/94, pulse Marland Kitchen)  105, temperature 98.3 F (36.8 C), temperature source Oral, resp. rate 20, height 5\' 11"  (1.803 m), weight 124.7 kg, SpO2 97 %.Body mass index is 38.35 kg/m.  General Appearance: Bizarre  Eye Contact:  Fair  Speech:  Pressured  Volume:  Increased  Mood:  Anxious and Dysphoric  Affect:  Congruent  Thought Process:  Disorganized and Descriptions of Associations: Tangential  Orientation:  Other:  to person and place only  Thought Content:  Illogical, Delusions and Hallucinations:  Auditory Visual per nursing notes, patients thinks his mother is sitting with the nursing staff  Suicidal Thoughts:  unable to assess  Homicidal Thoughts:  unable to assess  Memory:  unable to assess  Judgement:  Poor  Insight:  Lacking  Psychomotor Activity:  Increased  Concentration:  Concentration: Poor and Attention Span: Poor  Recall:  Poor  Fund of Knowledge:  Poor  Language:  Fair  Akathisia:  Negative  Handed:  Right  AIMS (if indicated):     Assets:  Housing Social Support  ADL's:  Impaired  Cognition:  Impaired,  Moderate  Sleep:   < 3 hours     Treatment Plan Summary: Patient continues to meet inpatient criteria, SW has faxed him out and we are awaiting facility acceptance.  He has not completed an EKG, lithium or valporic levels, recommend ordering now to rule toxicity.  Would consider administering a benzodiazepine such as lorazepam 1mg  po or IM q6h, prn to help manage irritability while awaiting results of EKG and labs.     Daily contact with patient to assess and evaluate symptoms and progress in treatment and Medication management  Disposition: Recommend psychiatric Inpatient admission when medically cleared.  This service was provided via telemedicine using a 2-way, interactive audio and video technology.  Names of all persons participating in this telemedicine service and their role in this encounter. Name: Demetric O. Reynoso Role: Patient  Name: Merlyn Lot Role: PMHNP   Above was discussed with Dr. Tomi Bamberger, [place] who will order labs and medication as outlined above.   Mallie Darting, NP 03/19/2020 6:11 PM

## 2020-03-19 NOTE — Progress Notes (Signed)
03/19/2020  1505  Per SW no bed offer yet.

## 2020-03-19 NOTE — Progress Notes (Signed)
03/19/2020  9798  Patient denies HI/SI. Patient believes he is here visiting with his mother. When asked where was his mother the patient responded with Michael [place]) NT sitting with patient is his mother.

## 2020-03-19 NOTE — Progress Notes (Signed)
Pt was denied placement at [facility] due to being to acute for their unit.    Pier Laux MSW, LCSWA Clincal Social Worker Disposition  [facility] Ph: [phone] Fax: [phone]  

## 2020-03-20 ENCOUNTER — Other Ambulatory Visit: Payer: Self-pay

## 2020-03-20 ENCOUNTER — Inpatient Hospital Stay (HOSPITAL_COMMUNITY)
Admission: AD | Admit: 2020-03-20 | Discharge: 2020-03-27 | DRG: 885 | Disposition: A | Discharge status: Home / Self Care | Payer: Federal, State, Local not specified - Other | Source: Intra-hospital | Attending: Psychiatry | Admitting: Psychiatry

## 2020-03-20 ENCOUNTER — Encounter (HOSPITAL_COMMUNITY): Payer: Self-pay | Admitting: Psychiatry

## 2020-03-20 DIAGNOSIS — F209 Schizophrenia, unspecified: Secondary | ICD-10-CM | POA: Diagnosis present

## 2020-03-20 DIAGNOSIS — F200 Paranoid schizophrenia: Secondary | ICD-10-CM | POA: Diagnosis present

## 2020-03-20 DIAGNOSIS — Z79899 Other long term (current) drug therapy: Secondary | ICD-10-CM | POA: Diagnosis not present

## 2020-03-20 DIAGNOSIS — Z9114 Patient's other noncompliance with medication regimen: Secondary | ICD-10-CM | POA: Diagnosis not present

## 2020-03-20 DIAGNOSIS — F23 Brief psychotic disorder: Secondary | ICD-10-CM

## 2020-03-20 DIAGNOSIS — Z87891 Personal history of nicotine dependence: Secondary | ICD-10-CM

## 2020-03-20 DIAGNOSIS — I10 Essential (primary) hypertension: Secondary | ICD-10-CM | POA: Diagnosis present

## 2020-03-20 MED ORDER — ACETAMINOPHEN 325 MG PO TABS
650.0000 mg | ORAL_TABLET | ORAL | Status: DC | PRN
  Administered 2020-03-22: 650 mg via ORAL
  Filled 2020-03-20: qty 2

## 2020-03-20 MED ORDER — CLONIDINE HCL 0.1 MG PO TABS
0.1000 mg | ORAL_TABLET | Freq: Once | ORAL | Status: AC
  Administered 2020-03-20: 0.1 mg via ORAL
  Filled 2020-03-20: qty 1

## 2020-03-20 MED ORDER — ONDANSETRON HCL 4 MG PO TABS: 4.0000 mg | ORAL_TABLET | Freq: Three times a day (TID) | ORAL | Status: DC | PRN

## 2020-03-20 MED ORDER — LORAZEPAM 1 MG PO TABS
1.0000 mg | ORAL_TABLET | Freq: Four times a day (QID) | ORAL | Status: DC | PRN
  Administered 2020-03-22 – 2020-03-24 (×3): 1 mg via ORAL
  Filled 2020-03-20 (×4): qty 1

## 2020-03-20 MED ORDER — MAGNESIUM HYDROXIDE 400 MG/5ML PO SUSP: 30.0000 mL | Freq: Every day | ORAL | Status: DC | PRN

## 2020-03-20 MED ORDER — PALIPERIDONE ER 3 MG PO TB24
3.0000 mg | ORAL_TABLET | Freq: Every day | ORAL | Status: DC
  Administered 2020-03-20: 3 mg via ORAL
  Filled 2020-03-20: qty 1

## 2020-03-20 MED ORDER — DIVALPROEX SODIUM ER 500 MG PO TB24
1000.0000 mg | ORAL_TABLET | Freq: Two times a day (BID) | ORAL | Status: DC
  Administered 2020-03-20: 1000 mg via ORAL
  Filled 2020-03-20: qty 2

## 2020-03-20 MED ORDER — DIVALPROEX SODIUM ER 500 MG PO TB24
1000.0000 mg | ORAL_TABLET | Freq: Two times a day (BID) | ORAL | Status: DC
  Administered 2020-03-20 – 2020-03-27 (×14): 1000 mg via ORAL
  Filled 2020-03-20 (×7): qty 2
  Filled 2020-03-20: qty 28
  Filled 2020-03-20 (×9): qty 2
  Filled 2020-03-20: qty 28
  Filled 2020-03-20 (×6): qty 2

## 2020-03-20 MED ORDER — ALUM & MAG HYDROXIDE-SIMETH 200-200-20 MG/5ML PO SUSP
30.0000 mL | Freq: Four times a day (QID) | ORAL | Status: DC | PRN
  Administered 2020-03-24 – 2020-03-25 (×2): 30 mL via ORAL

## 2020-03-20 MED ORDER — MELATONIN 3 MG PO TABS
3.0000 mg | ORAL_TABLET | Freq: Every day | ORAL | Status: DC
  Administered 2020-03-20 – 2020-03-23 (×3): 3 mg via ORAL
  Filled 2020-03-20 (×8): qty 1

## 2020-03-20 MED ORDER — LITHIUM CARBONATE ER 300 MG PO TBCR
300.0000 mg | EXTENDED_RELEASE_TABLET | Freq: Two times a day (BID) | ORAL | Status: DC
  Administered 2020-03-20: 300 mg via ORAL
  Filled 2020-03-20: qty 1

## 2020-03-20 MED ORDER — PALIPERIDONE ER 3 MG PO TB24
3.0000 mg | ORAL_TABLET | Freq: Every day | ORAL | Status: DC
  Administered 2020-03-21: 3 mg via ORAL
  Filled 2020-03-20 (×2): qty 1

## 2020-03-20 MED ORDER — LITHIUM CARBONATE ER 300 MG PO TBCR
300.0000 mg | EXTENDED_RELEASE_TABLET | Freq: Two times a day (BID) | ORAL | Status: DC
  Administered 2020-03-20 – 2020-03-24 (×7): 300 mg via ORAL
  Filled 2020-03-20 (×12): qty 1

## 2020-03-20 NOTE — Progress Notes (Signed)
Pt agitated with Clinical research associate , pt refused medications from Clinical research associate. Noted pt took medication from male nurses. Pt stated he did not need medication. Writer got male nurse Erskine Squibb to give him his medication and pt took it without issue. Pt continues to be inappropriate with females.     03/20/20 2000  Psych Admission Type (Psych Patients Only)  Admission Status Involuntary  Psychosocial Assessment  Patient Complaints Anxiety;Agitation  Eye Contact Fair  Facial Expression Animated;Anxious;Pensive  Affect Anxious;Irritable;Preoccupied  Speech Logical/coherent;Pressured  Interaction Assertive;Attention-seeking;Sexually inappropriate  Motor Activity Fidgety;Restless  Appearance/Hygiene In scrubs  Behavior Characteristics Agitated  Mood Labile  Thought Process  Coherency Flight of ideas  Content Blaming others;Magical thinking;Obsessions  Delusions Grandeur  Perception Hallucinations  Hallucination Auditory  Judgment Poor  Confusion Mild  Danger to Self  Current suicidal ideation? Denies  Danger to Others  Danger to Others None reported or observed

## 2020-03-20 NOTE — ED Notes (Signed)
Pt talking to the TTS machine  

## 2020-03-20 NOTE — ED Notes (Signed)
Pt off unit to [facility] per provider. Pt alert, cooperative, no s/s of distress. DC information given to [employer] for [facility]. Belongings given to [facility] for [facility]. Pt ambulatory off unit, escorted and transported by [employer].

## 2020-03-20 NOTE — ED Notes (Signed)
Pt.s lunch arrived, checked vitals. Vitals was taken, pt remained calmed but would occassionally make inappropriate comments. Told pt that was, again, not acceptable. Pt remained calm after redirected.

## 2020-03-20 NOTE — ED Notes (Signed)
When this writer arrived, pt did not have a mask despite being out on the hallway. Told pt to go back to his room and to wear a mask. Pt explained he didn't have a mask. Provided pt with a mask, but pt began using inappropriate language to this writer and told pt "we will NOT be using that language here.". Pt nodded and understood, went into his room and is now sitting on the ground near the door with his mask on. Will occasionally call out for something but remains cooperative. Will continue to monitor pt.

## 2020-03-20 NOTE — Progress Notes (Signed)
03/20/2020  1155  Notified Dr Particia Nearing regarding patients BP 135/101. Waiting for response. Will continue to monitor.

## 2020-03-20 NOTE — ED Notes (Signed)
Pt.s wife called asking to talk to the pt. Pt is on the phone with his wife.
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
## 2020-03-20 NOTE — Progress Notes (Signed)
03/20/2020  1330  Called report to [facility] [phone].  Report given to Casimiro Needle.
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
## 2020-03-20 NOTE — Progress Notes (Signed)
03/20/2020  1218  Patient's wife [phone] called and left her number for the doctor's could call her with any updates.

## 2020-03-20 NOTE — Progress Notes (Signed)
03/20/2020  0805  Paged Psych NP to see if someone could at least order patients home medicine. Currently patient has no medicines ordered, except PRN medicines.

## 2020-03-20 NOTE — Progress Notes (Signed)
Admission DAR Note: Pt is a 39 y/o male who was transferred from [facility] (TCU) to [facility] Adult unit for continuation of care. Pt presents under IVC status, disorganized, delusional with flights of ideas on assessment. Stated he wanted to return to [facility] with the police because "I left my 4 queens there and I want to marry you too so you can be my fifth queen".  Denies SI, HI, AVH, pain, substance abuse and recent falls. Pt unable to state home medications or recent medical visit "I'm well, I don't need to see no doctor, I'm happy". Pt does have a history of Schizophrenia, has an ACTT team and was recently d/c from inpatient status on 03/06/20 but wife is concerned about worsening symptoms and safety of her and their children. Pt has been intrusive and sexually inappropriate on interactions requiring multiple verbal redirections. Skin assessment done, belongings searched for contraband. Pt's skin is intact without areas of breakdown. All belongings deemed contraband secured in locker. Unit orientation done, routines discussed and care plan reviewed with pt, understanding verbalized. Emotional support offered, pt  Encouraged to voice concerns. Q 15 minutes safety checks initiated without self harm gestures.
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
## 2020-03-20 NOTE — Tx Team (Signed)
Initial Treatment Plan 03/20/2020 6:39 PM Sueo Phoebe Perch IDP:[id number]    PATIENT STRESSORS: Medication change or noncompliance   PATIENT STRENGTHS: Capable of independent living Physical Health Religious Affiliation Supportive family/friends   PATIENT IDENTIFIED PROBLEMS: Alteration in thought process (Delusional, flight of ideas) "Please take me back to the other hospital, I left my 4 queens there and I want to marry you too, let's go to the other queens".    Risk for harm to self and others related to medication noncompliance.    Ineffective coping skills (Sexually inappropriate)             DISCHARGE CRITERIA:  Improved stabilization in mood, thinking, and/or behavior Verbal commitment to aftercare and medication compliance  PRELIMINARY DISCHARGE PLAN: Outpatient therapy Return to previous living arrangement  PATIENT/FAMILY INVOLVEMENT: This treatment plan has been presented to and reviewed with the patient, Abelino Duard Spiewak. The patient have been given the opportunity to ask questions and make suggestions.  Sherryl Manges, RN 03/20/2020, 6:39 PM

## 2020-03-20 NOTE — Progress Notes (Signed)
03/20/2020  1340  Call Police for transport to [facility] [phone]. Police are scheduled to come at 4pm to transport patient to [facility].

## 2020-03-20 NOTE — BH Assessment (Signed)
BHH Assessment Progress Note  Per Nanine Means, DNP, this pt requires psychiatric hospitalization.  Percell Boston, RN has assigned pt to [facility] Rm [id number]; she will call when [facility] is ready to receive pt.  Pt presents under IVC initiated by EDP Raeford Razor, MD, and IVC documents have been faxed to [facility].  Pt's nurse, Addison Naegeli, has been notified, and agrees to call report to [phone].  Pt is to be transported via Patent examiner.   Doylene Canning, [place] Behavioral Health Coordinator [phone]

## 2020-03-20 NOTE — Progress Notes (Signed)
Pt tries to be very controversial with Clinical research associate. Pt came out the dayroom asking" where do you guys go pee". Pt asked if he needed to go to the bathroom and pt responded yes. Pt was informed that he could use the bathroom in his room, pt got upset made some incoherent remarks toward writer and stormed off to his room.

## 2020-03-21 DIAGNOSIS — F200 Paranoid schizophrenia: Secondary | ICD-10-CM

## 2020-03-21 MED ORDER — PROPRANOLOL HCL 20 MG PO TABS
20.0000 mg | ORAL_TABLET | Freq: Two times a day (BID) | ORAL | Status: DC
  Administered 2020-03-21 – 2020-03-27 (×13): 20 mg via ORAL
  Filled 2020-03-21: qty 2
  Filled 2020-03-21 (×7): qty 1
  Filled 2020-03-21: qty 2
  Filled 2020-03-21: qty 14
  Filled 2020-03-21 (×2): qty 1
  Filled 2020-03-21: qty 14
  Filled 2020-03-21 (×5): qty 1
  Filled 2020-03-21: qty 2
  Filled 2020-03-21: qty 1

## 2020-03-21 MED ORDER — RISPERIDONE 3 MG PO TABS
3.0000 mg | ORAL_TABLET | Freq: Two times a day (BID) | ORAL | Status: DC
  Administered 2020-03-21 – 2020-03-22 (×3): 3 mg via ORAL
  Filled 2020-03-21 (×7): qty 1

## 2020-03-21 MED ORDER — CLONAZEPAM 1 MG PO TABS
2.0000 mg | ORAL_TABLET | Freq: Three times a day (TID) | ORAL | Status: AC
  Administered 2020-03-21 – 2020-03-22 (×6): 2 mg via ORAL
  Filled 2020-03-21 (×4): qty 2
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
## 2020-03-21 NOTE — BHH Counselor (Signed)
CSW spoke with patient's [facility], Remigio Eisenmenger [phone]). Clydie Braun reports patient is established with [facility] ACTT services and has had multiple recent hospitalizations at [facility].   Care Coordinator would like updates.   Enid Cutter, MSW, LCSW-A Clinical Social Worker [facility] Adult Unit

## 2020-03-21 NOTE — Progress Notes (Signed)
Pt had to be re-directed numerous times to not touch other patients, pt tried to get another patient to sit in his lap. Pt may need to be watched , pt continues to try to be inappropriate with females on the unit.

## 2020-03-21 NOTE — Progress Notes (Signed)
Recreation Therapy Notes  Date: 4.20.21 Time: 1000 Location: 500 Hall Dayroom  Group Topic: Wellness  Goal Area(s) Addresses:  Patient will define components of whole wellness. Patient will verbalize benefit of whole wellness.  Behavioral Response: None  Intervention: Music  Activity: Exercise.  LRT and patients engaged completed various dance moves to the rhythm of Afro Beats.  Patients were to move as much as possible but were encouraged to take breaks as needed.  Education: Wellness, Building control surveyor.   Education Outcome: Acknowledges education/In group clarification offered/Needs additional education.   Clinical Observations/Feedback: Pt was drowsy and appeared to be sleep for most of session.  Pt was called out of group to meet with social worker and did not return.   Caroll Rancher, LRT/CTRS    Caroll Rancher A 03/21/2020 10:51 AM

## 2020-03-21 NOTE — Progress Notes (Signed)
Pt has been irritable and verbally aggressive to this RN during the shift.  Pt is calmer when speaking to male staff, however, he is also inappropriate and making sexual comments to the male staff members. Pt denied SI/HI/AVH.  Safety checks q 15 minutes remain in place.  RN will continue to monitor and provide support as needed.  Pt remains on Do Not Admit roommate status  Because of pt's behavioral issues and intrusiveness.

## 2020-03-21 NOTE — [facility] Suicide Risk Assessment (Signed)
[facility] Admission Suicide Risk Assessment   Nursing information obtained from:  Patient, Review of record Demographic factors:  Male Current Mental Status:  Plan to harm others Loss Factors:  Decline in physical health Historical Factors:  Impulsivity Risk Reduction Factors:  Sense of responsibility to family, Positive social support, Responsible for children under 38 years of age, Living with another person, especially a relative, Positive therapeutic relationship  Total Time spent with patient: 45 minutes Principal Problem: Schizophrenia Diagnosis:  Active Problems:   Schizophrenia, unspecified (HCC)  Subjective Data: Admitted for exacerbation of psychotic disorder and noncompliance  Continued Clinical Symptoms:  Alcohol Use Disorder Identification Test Final Score (AUDIT): 0 The "Alcohol Use Disorders Identification Test", Guidelines for Use in Primary Care, Second Edition.  World Science writer [facility]). Score between 0-7:  no or low risk or alcohol related problems. Score between 8-15:  moderate risk of alcohol related problems. Score between 16-19:  high risk of alcohol related problems. Score 20 or above:  warrants further diagnostic evaluation for alcohol dependence and treatment.   CLINICAL FACTORS:   Previous Psychiatric Diagnoses and Treatments  Musculoskeletal: Strength & Muscle Tone: within normal limits Gait & Station: normal Patient leans: N/A  Psychiatric Specialty Exam: Physical Exam  Nursing note and vitals reviewed. Constitutional: He appears well-developed and well-nourished.  Cardiovascular: Normal rate and regular rhythm.    Review of Systems  Constitutional: Negative.   Eyes: Negative.   Respiratory: Negative.   Cardiovascular: Negative.   Gastrointestinal: Negative.   Endocrine: Negative.   Genitourinary: Negative.   Musculoskeletal: Negative.   Neurological: Negative.     Blood pressure (!) 131/97, pulse (!) 115, temperature 97.7 F (36.5 C),  temperature source Oral, resp. rate 20, height 5\' 11"  (1.803 m), weight 124.7 kg, SpO2 100 %.Body mass index is 38.35 kg/m.  General Appearance: Disheveled  Eye Contact:  Fair  Speech:  Clear and Coherent  Volume:  Normal  Mood:  Euthymic  Affect:  Congruent  Thought Process:  Irrelevant and Descriptions of Associations: Loose  Orientation:  Full (Time, Place, and Person)  Thought Content:  Illogical, Delusions, Hallucinations: Auditory and Paranoid Ideation  Suicidal Thoughts:  No  Homicidal Thoughts:  No  Memory:  Immediate;   Fair Recent;   Fair Remote;   Fair  Judgement:  Impaired  Insight:  Lacking  Psychomotor Activity:  Normal  Concentration:  Concentration: Fair and Attention Span: Fair  Recall:  Poor  Fund of Knowledge:  Fair  Language:  Fair  Akathisia:  NA  Handed:  Right  AIMS (if indicated):     Assets:  Leisure Time Physical Health  ADL's:  Intact  Cognition:  WNL  Sleep:  Number of Hours: 4      COGNITIVE FEATURES THAT CONTRIBUTE TO RISK:  Closed-mindedness and Loss of executive function    SUICIDE RISK:   Minimal: No identifiable suicidal ideation.  Patients presenting with no risk factors but with morbid ruminations; may be classified as minimal risk based on the severity of the depressive symptoms  PLAN OF CARE: admit  I certify that inpatient services furnished can reasonably be expected to improve the patient's condition.   , MD 03/21/2020, 8:02 AM

## 2020-03-21 NOTE — H&P (Signed)
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  The patient initially presented on 4/5 monitored until 4/6 and released to further services with his ACT team ([facility]) however he re-presented on 4/17 under petition.  The patient himself states "doctors tell me I have illness but I do not believe them" he states he does not need medication.  Thus far on the ward he has been sexually inappropriate with students, with other patients, having to be redirected out of a male patient's room. He denies auditory or visual hallucinations but does express delusional beliefs when questioned.  He also makes some bizarre statements when questioned.  According to laboratory work is Depakote level is 16, lithium level is negligible indicating noncompliance.  Drug screen negative for illicit compounds.  Patient denies alcohol or drug abuse.  According to assessment notes and consultation notes of the 17th 18th and 19th:   Schizophrenia, history of multiple episodes, currently acute (HCC)  Today, patient continues to be manic with delusions of grandeur.  On assessment he tells me "I do not know my name".  When asked about previous psych admissions he does report years ago, "what does that matter?"  When asked about allergies, he reports being allergic to "Bull sh*%$".  Inpatient admission still warranted.  Home  medications restarted with the addition of oral Invega so that he can get his Invega injection if he tolerates.    Tele Assessment on 03/19/20: Cornelia Copa, 39 y.o., male patient presented to [facility] voluntarily with his spouse for evaluation of audible and visual hallucinations and bizarre behaviors .  Patient seen via telepsych by this provider; chart reviewed and consulted with Dr. Lucianne Muss on 03/20/20.  On evaluation Spenser Kayo Zion reports "I am always in the hospital.  I found love her at the hospital, with my brother's daughter".  When asked for clarification of his statement he states, "I mean friendship, I am friends with everyone".   When asked how he slept, he states, " I no longer have to sleep, I was given permission from God to never sleep again".    Per collateral information previously received from the nurse, the patient is restless, maniac and has not slept since admission yesterday.  His medication list includes Paliperidone 234mg /1.35ml; lithium carbonate 300mg  qhs; and divalproex 1000mg  BID.  Patient spouse administers medications,per nursing staff she verifies last dose was yesterday and denies missed dosages.   Although his communication is nonsensical, he appears more calm and now responds to verbal redirection.  He continues to make hyperreligious and hypersexual statements during the assessment.  At one point, he referred to this writer as "baby" and made other inappropriate comments requiring redirection.      Associated Signs/Symptoms: Depression Symptoms:  psychomotor agitation, (Hypo) Manic Symptoms:  Flight of Ideas, Anxiety Symptoms:  n/a Psychotic Symptoms:  Delusions, Hallucinations: Auditory PTSD Symptoms: Had a traumatic exposure:  ukn Total Time spent with patient: 45 minutes  Past Psychiatric History: Longstanding history of psychotic disorder/poor compliance/on long-acting injectable paliperidone, uncertain of last dose.  Is  the patient at risk to  self? Yes.    Has the patient been a risk to self in the past 6 months? Yes.    Has the patient been a risk to self within the distant past? Yes.    Is the patient a risk to others? Yes.    Has the patient been a risk to others in the past 6 months? Yes.    Has the patient been a risk to others within the distant past? Yes.     Prior Inpatient Therapy:  Would not elaborate will find out from wife Prior Outpatient Therapy:  Would not elaborate  Alcohol Screening: 1. How often do you have a drink containing alcohol?: Never 2. How many drinks containing alcohol do you have on a typical day when you are drinking?: 1 or 2 3. How often do you have six or more drinks on one occasion?: Never AUDIT-C Score: 0 4. How often during the last year have you found that you were not able to stop drinking once you had started?: Never 5. How often during the last year have you failed to do what was normally expected from you becasue of drinking?: Never 6. How often during the last year have you needed a first drink in the morning to get yourself going after a heavy drinking session?: Never 7. How often during the last year have you had a feeling of guilt of remorse after drinking?: Never 8. How often during the last year have you been unable to remember what happened the night before because you had been drinking?: Never 9. Have you or someone else been injured as a result of your drinking?: No 10. Has a relative or friend or a doctor or another health worker been concerned about your drinking or suggested you cut down?: No Alcohol Use Disorder Identification Test Final Score (AUDIT): 0 Substance Abuse History in the last 12 months:  No. Consequences of Substance Abuse: NA Previous Psychotropic Medications: Yes  Psychological Evaluations: No  Past Medical History:  Past Medical History:  Diagnosis Date  . Paranoid schizophrenia (HCC)    History reviewed. No pertinent surgical history. Family History:   Family History  Problem Relation Age of Onset  . Healthy Mother   . Healthy Father    Family Psychiatric  History: ukn Tobacco Screening:   Social History:  Social History   Substance and Sexual Activity  Alcohol Use No     Social History   Substance and Sexual Activity  Drug Use No    Additional Social History:                           Allergies:  No Known Allergies Lab Results:  Results for orders placed or performed during the hospital encounter of 03/18/20 (from the past 48 hour(s))  Lithium level     Status: Abnormal   Collection Time: 03/19/20  5:54 PM  Result Value Ref Range   Lithium Lvl <0.06 (L) 0.60 - 1.20 mmol/L    Comment: Performed at [facility], [address] [place], [place] [address]  Valproic acid level     Status: Abnormal   Collection Time: 03/19/20  5:54 PM  Result Value Ref Range   Valproic Acid Lvl 16 (L) 50.0 - 100.0 ug/mL    Comment: Performed at [facility], [address] [place], [place] [address]    Blood Alcohol level:  Lab Results  Component Value Date   ETH <10 03/18/2020    Metabolic Disorder Labs:  No results found for: HGBA1C, MPG No results found for: PROLACTIN No results found for: CHOL, TRIG, HDL, CHOLHDL, VLDL, LDLCALC  Current Medications: Current Facility-Administered Medications  Medication Dose Route Frequency Provider Last Rate Last Admin  . acetaminophen (TYLENOL) tablet 650 mg  650 mg Oral Q4H PRN Charm Rings, NP      . alum & mag hydroxide-simeth (MAALOX/MYLANTA) 200-200-20 MG/5ML suspension 30 mL  30 mL Oral Q6H PRN Charm Rings, NP      . clonazePAM Scarlette Calico) tablet 2 mg  2 mg Oral TID Malvin Johns, MD      . divalproex (DEPAKOTE ER) 24 hr tablet 1,000 mg  1,000 mg Oral BID Charm Rings, NP   1,000 mg at 03/20/20 1824  . lithium carbonate (LITHOBID) CR tablet 300 mg  300 mg Oral Q12H Charm Rings, NP   300 mg at 03/20/20 2036  . LORazepam  (ATIVAN) tablet 1 mg  1 mg Oral Q6H PRN Charm Rings, NP      . magnesium hydroxide (MILK OF MAGNESIA) suspension 30 mL  30 mL Oral Daily PRN Charm Rings, NP      . melatonin tablet 3 mg  3 mg Oral QHS Charm Rings, NP   3 mg at 03/20/20 2036  . ondansetron (ZOFRAN) tablet 4 mg  4 mg Oral Q8H PRN Charm Rings, NP      . paliperidone (INVEGA) 24 hr tablet 3 mg  3 mg Oral Daily Lord, Jamison Y, NP      . risperiDONE (RISPERDAL) tablet 3 mg  3 mg Oral BID Malvin Johns, MD       PTA Medications: Medications Prior to Admission  Medication Sig Dispense Refill Last Dose  . divalproex (DEPAKOTE ER) 500 MG 24 hr tablet Take 1,000 mg by mouth 2 (two) times daily.      Marland Kitchen lithium carbonate (LITHOBID) 300 MG CR tablet Take 300 mg by mouth at bedtime.      Marland Kitchen omeprazole (PRILOSEC) 20 MG capsule Take 2 capsules (40 mg total) by mouth 2 (two) times daily before a meal. (Patient not taking: Reported on 03/18/2020) 60 capsule 1   . paliperidone (INVEGA SUSTENNA) 234 MG/1.5ML SUSY injection Inject 1.5 mLs into the muscle every 30 (thirty) days.       Musculoskeletal: Strength & Muscle Tone: within normal limits Gait & Station: normal Patient leans: N/A  Psychiatric Specialty Exam: Physical Exam  Nursing note and vitals reviewed. Constitutional: He appears well-developed and well-nourished.  Cardiovascular: Normal rate and regular rhythm.    Review of Systems  Constitutional: Negative.   Eyes: Negative.   Respiratory: Negative.   Cardiovascular: Negative.   Gastrointestinal: Negative.   Endocrine: Negative.   Genitourinary: Negative.   Musculoskeletal: Negative.   Neurological: Negative.     Blood pressure (!) 131/97, pulse (!) 115, temperature 97.7 F (36.5 C), temperature source Oral, resp. rate 20, height 5\' 11"  (1.803 m), weight 124.7 kg, SpO2 100 %.Body mass index is 38.35 kg/m.  General Appearance: Disheveled  Eye Contact:  Fair  Speech:  Clear and Coherent  Volume:  Normal   Mood:  Euthymic  Affect:  Congruent  Thought Process:  Irrelevant and Descriptions of Associations: Loose  Orientation:  Full (Time, Place, and Person)  Thought Content:  Illogical, Delusions, Hallucinations: Auditory and Paranoid Ideation  Suicidal Thoughts:  No  Homicidal Thoughts:  No  Memory:  Immediate;   Fair Recent;   Fair Remote;   Fair  Judgement:  Impaired  Insight:  Lacking  Psychomotor Activity:  Normal  Concentration:  Concentration: Fair and Attention Span: Fair  Recall:  Poor  Fund of Knowledge:  Fair  Language:  Fair  Akathisia:  NA  Handed:  Right  AIMS (if indicated):     Assets:  Leisure Time Physical Health  ADL's:  Intact  Cognition:  WNL  Sleep:  Number of Hours: 4    Treatment Plan Summary: Daily contact with patient to assess and evaluate symptoms and progress in treatment and Medication management  Observation Level/Precautions:  15 minute checks  Laboratory:  UDS  Psychotherapy: Cognitive reality based  Medications: Begin oral antipsychotic  Consultations: Not necessary  Discharge Concerns: Longer-term compliance  Estimated LOS: 5-7  Other:     Physician Treatment Plan for Primary Diagnosis:   Axis I-schizophrenia paranoid type Axis II deferred Axis III hypertensive Plans begin antipsychotic medication continue mood stabilizer therapy encourage compliance/check data last Invega Long Term Goal(s): Improvement in symptoms so as ready for discharge  Short Term Goals: Ability to demonstrate self-control will improve  Physician Treatment Plan for Secondary Diagnosis: Active Problems:   Schizophrenia, unspecified ([place])  Long Term Goal(s): Improvement in symptoms so as ready for discharge  Short Term Goals: Ability to demonstrate self-control will improve  I certify that inpatient services furnished can reasonably be expected to improve the patient's condition.    Johnn Hai, MD 4/20/20217:54 AM

## 2020-03-21 NOTE — Progress Notes (Signed)
Recreation Therapy Notes  INPATIENT RECREATION THERAPY ASSESSMENT  Patient Details Name: Steven Richardson MRN: [id number] DOB: 1981/11/01 Today's Date: 03/21/2020       Information Obtained From: Patient  Able to Participate in Assessment/Interview: Yes  Patient Presentation: Alert(Sexually Inappropriate)  Reason for Admission (Per Patient): Other (Comments)(Pt stated the police brought him here.)  Patient Stressors: (None identified)  Coping Skills:   Music, Exercise, Talk, Dance  Leisure Interests (2+):  Individual - Other (Comment)("anything with women")  Frequency of Recreation/Participation: (No answer given)  Expressed Interest in [employer] Information: No  County of Residence:  [place]  Patient Main Form of Transportation: Walk  Patient Strengths:  None identified  Patient Identified Areas of Improvement:  Be a true lover; Need more women  Patient Goal for Hospitalization:  "shave and be a king"  Current SI (including self-harm):  No  Current HI:  No  Current AVH: No  Staff Intervention Plan: Group Attendance, Collaborate with Interdisciplinary Treatment Team  Consent to Intern Participation: N/A    Caroll Rancher, LRT/CTRS  Lillia Abed, Adama Ivins A 03/21/2020, 11:13 AM

## 2020-03-22 MED ORDER — RISPERIDONE 2 MG PO TABS
4.0000 mg | ORAL_TABLET | Freq: Two times a day (BID) | ORAL | Status: DC
  Administered 2020-03-22 – 2020-03-24 (×4): 4 mg via ORAL
  Filled 2020-03-22 (×7): qty 2

## 2020-03-22 NOTE — B[facility] Group Notes (Signed)
[facility] LCSW Group Therapy Note  Date/Time: 03/22/20, 1315  Type of Therapy/Topic:  Group Therapy:  Feelings about Diagnosis  Participation Level:  Did Not Attend   Mood:   Description of Group:    This group will allow patients to explore their thoughts and feelings about diagnoses they have received. Patients will be guided to explore their level of understanding and acceptance of these diagnoses. Facilitator will encourage patients to process their thoughts and feelings about the reactions of others to their diagnosis, and will guide patients in identifying ways to discuss their diagnosis with significant others in their lives. This group will be process-oriented, with patients participating in exploration of their own experiences as well as giving and receiving support and challenge from other group members.   Therapeutic Goals: 1. Patient will demonstrate understanding of diagnosis as evidence by identifying two or more symptoms of the disorder:  2. Patient will be able to express two feelings regarding the diagnosis 3. Patient will demonstrate ability to communicate their needs through discussion and/or role plays  Summary of Patient Progress:        Therapeutic Modalities:   Cognitive Behavioral Therapy Brief Therapy Feelings Identification   Greg Nimco Bivens, LCSW 
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
## 2020-03-22 NOTE — Tx Team (Signed)
Interdisciplinary Treatment and Diagnostic Plan Update  03/22/2020 Time of Session: 9:00am Steven Richardson MRN: [id number]  Principal Diagnosis: <principal problem not specified>  Secondary Diagnoses: Active Problems:   Schizophrenia, unspecified ([place])   Current Medications:  Current Facility-Administered Medications  Medication Dose Route Frequency Provider Last Rate Last Admin  . acetaminophen (TYLENOL) tablet 650 mg  650 mg Oral Q4H PRN Patrecia Pour, NP      . alum & mag hydroxide-simeth (MAALOX/MYLANTA) 200-200-20 MG/5ML suspension 30 mL  30 mL Oral Q6H PRN Patrecia Pour, NP      . clonazePAM Bobbye Charleston) tablet 2 mg  2 mg Oral TID Johnn Hai, MD   2 mg at 03/22/20 0726  . divalproex (DEPAKOTE ER) 24 hr tablet 1,000 mg  1,000 mg Oral BID Patrecia Pour, NP   1,000 mg at 03/22/20 0726  . lithium carbonate (LITHOBID) CR tablet 300 mg  300 mg Oral Q12H Patrecia Pour, NP   300 mg at 03/22/20 0726  . LORazepam (ATIVAN) tablet 1 mg  1 mg Oral Q6H PRN Patrecia Pour, NP      . magnesium hydroxide (MILK OF MAGNESIA) suspension 30 mL  30 mL Oral Daily PRN Patrecia Pour, NP      . melatonin tablet 3 mg  3 mg Oral QHS Patrecia Pour, NP   3 mg at 03/20/20 2036  . ondansetron (ZOFRAN) tablet 4 mg  4 mg Oral Q8H PRN Patrecia Pour, NP      . propranolol (INDERAL) tablet 20 mg  20 mg Oral BID Johnn Hai, MD   20 mg at 03/22/20 0726  . risperiDONE (RISPERDAL) tablet 4 mg  4 mg Oral BID Johnn Hai, MD       PTA Medications: Medications Prior to Admission  Medication Sig Dispense Refill Last Dose  . divalproex (DEPAKOTE ER) 500 MG 24 hr tablet Take 1,000 mg by mouth 2 (two) times daily.      Marland Kitchen lithium carbonate (LITHOBID) 300 MG CR tablet Take 300 mg by mouth at bedtime.      Marland Kitchen omeprazole (PRILOSEC) 20 MG capsule Take 2 capsules (40 mg total) by mouth 2 (two) times daily before a meal. (Patient not taking: Reported on 03/18/2020) 60 capsule 1   . paliperidone (INVEGA SUSTENNA)  234 MG/1.5ML SUSY injection Inject 1.5 mLs into the muscle every 30 (thirty) days.       Patient Stressors: Medication change or noncompliance  Patient Strengths: Capable of independent living Physical Health Religious Affiliation Supportive family/friends  Treatment Modalities: Medication Management, Group therapy, Case management,  1 to 1 session with clinician, Psychoeducation, Recreational therapy.   Physician Treatment Plan for Primary Diagnosis: <principal problem not specified> Long Term Goal(s): Improvement in symptoms so as ready for discharge Improvement in symptoms so as ready for discharge   Short Term Goals: Ability to demonstrate self-control will improve Ability to demonstrate self-control will improve  Medication Management: Evaluate patient's response, side effects, and tolerance of medication regimen.  Therapeutic Interventions: 1 to 1 sessions, Unit Group sessions and Medication administration.  Evaluation of Outcomes: Not Met  Physician Treatment Plan for Secondary Diagnosis: Active Problems:   Schizophrenia, unspecified ([place])  Long Term Goal(s): Improvement in symptoms so as ready for discharge Improvement in symptoms so as ready for discharge   Short Term Goals: Ability to demonstrate self-control will improve Ability to demonstrate self-control will improve     Medication Management: Evaluate patient's response, side effects, and tolerance of  medication regimen.  Therapeutic Interventions: 1 to 1 sessions, Unit Group sessions and Medication administration.  Evaluation of Outcomes: Not Met   RN Treatment Plan for Primary Diagnosis: <principal problem not specified> Long Term Goal(s): Knowledge of disease and therapeutic regimen to maintain health will improve  Short Term Goals: Ability to identify and develop effective coping behaviors will improve and Compliance with prescribed medications will improve  Medication Management: RN will administer  medications as ordered by provider, will assess and evaluate patient's response and provide education to patient for prescribed medication. RN will report any adverse and/or side effects to prescribing provider.  Therapeutic Interventions: 1 on 1 counseling sessions, Psychoeducation, Medication administration, Evaluate responses to treatment, Monitor vital signs and CBGs as ordered, Perform/monitor CIWA, COWS, AIMS and Fall Risk screenings as ordered, Perform wound care treatments as ordered.  Evaluation of Outcomes: Not Met   LCSW Treatment Plan for Primary Diagnosis: <principal problem not specified> Long Term Goal(s): Safe transition to appropriate next level of care at discharge, Engage patient in therapeutic group addressing interpersonal concerns.  Short Term Goals: Engage patient in aftercare planning with referrals and resources, Increase ability to appropriately verbalize feelings, Facilitate acceptance of mental health diagnosis and concerns, Identify triggers associated with mental health/substance abuse issues and Increase skills for wellness and recovery  Therapeutic Interventions: Assess for all discharge needs, 1 to 1 time with Social worker, Explore available resources and support systems, Assess for adequacy in community support network, Educate family and significant other(s) on suicide prevention, Complete Psychosocial Assessment, Interpersonal group therapy.  Evaluation of Outcomes: Not Met  Progress in Treatment: Attending groups: Yes. Participating in groups: Yes. Taking medication as prescribed: Yes. Toleration medication: Yes. Family/Significant other contact made: No, will contact:  wife Patient understands diagnosis: No. Discussing patient identified problems/goals with staff: Yes. Medical problems stabilized or resolved: Yes. Denies suicidal/homicidal ideation: Yes. Issues/concerns per patient self-inventory: Yes.  New problem(s) identified: Yes, Describe:   sexually inappropriate with staff  New Short Term/Long Term Goal(s): medication management for mood stabilization; elimination of SI thoughts; development of comprehensive mental wellness/sobriety plan.  Patient Goals: "I'm ready to leave."  Discharge Plan or Barriers: Patient is established with [facility] for ACTT services.  Reason for Continuation of Hospitalization: Delusions  Hallucinations Medication stabilization  Estimated Length of Stay: 5-7 days  Attendees: Patient: Steven Richardson 03/22/2020 9:13 AM  Physician: Dr.Farah 03/22/2020 9:13 AM  Nursing:  03/22/2020 9:13 AM  RN Care Manager: 03/22/2020 9:13 AM  Social Worker: Stephanie Acre, Latanya Presser 03/22/2020 9:13 AM  Recreational Therapist:  03/22/2020 9:13 AM  Other:  03/22/2020 9:13 AM  Other:  03/22/2020 9:13 AM  Other: 03/22/2020 9:13 AM    Scribe for Treatment Team: Joellen Jersey, [place] 03/22/2020 9:13 AM
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
## 2020-03-22 NOTE — Progress Notes (Signed)
   03/22/20 2100  Psych Admission Type (Psych Patients Only)  Admission Status Involuntary  Psychosocial Assessment  Patient Complaints Anxiety;Restlessness  Eye Contact Fair  Facial Expression Animated;Anxious;Pensive  Affect Anxious;Appropriate to circumstance;Irritable;Preoccupied  Speech Logical/coherent;Pressured  Interaction Assertive;Attention-seeking;Intrusive;Sexually inappropriate  Motor Activity Fidgety;Restless;Slow  Appearance/Hygiene Unremarkable  Behavior Characteristics Cooperative  Mood Anxious  Thought Chartered certified accountant of ideas;Loose associations  Content Compulsions;Delusions;Obsessions;Preoccupation  Delusions Grandeur  Perception Derealization;Hallucinations  Hallucination Auditory  Judgment Poor  Confusion Mild  Danger to Self  Current suicidal ideation? Denies  Danger to Others  Danger to Others None reported or observed

## 2020-03-22 NOTE — Progress Notes (Signed)
Recreation Therapy Notes  Date: 4.21.21 Time: 0950 Location: [address] Dayroom   Group Topic: Communication, Team Building, Problem Solving  Goal Area(s) Addresses:  Patient will effectively work with peer towards shared goal.  Patient will identify skill used to make activity successful.  Patient will identify how skills used during activity can be used to reach post d/c goals.   Behavioral Response: Minimal  Intervention: STEM Activity   Activity: [employer]. Patients were provided the following materials: 5 drinking straws, 5 rubber bands, 5 paper clips, 2 index cards and  2 drinking cups. Using the provided materials patients were asked to build a launching mechanisms to launch a ping pong ball approximately 12 feet. Patients were divided into teams of 3-5.   Education: Pharmacist, community, Building control surveyor.   Education Outcome: Acknowledges education/In group clarification offered/Needs additional education.   Clinical Observations/Feedback: Pt gave minimal participation.  Pt needed constant redirection from his sexually inappropriate behavior.  Pt would say inappropriate things to LRT and try to touch LRT.    Caroll Rancher, LRT/CTRS   Lillia Abed, Gedalya Jim A 03/22/2020 11:40 AM

## 2020-03-23 MED ORDER — BENZTROPINE MESYLATE 1 MG PO TABS
1.0000 mg | ORAL_TABLET | Freq: Two times a day (BID) | ORAL | Status: DC
  Administered 2020-03-23 – 2020-03-27 (×8): 1 mg via ORAL
  Filled 2020-03-23: qty 14
  Filled 2020-03-23: qty 1
  Filled 2020-03-23: qty 14
  Filled 2020-03-23 (×9): qty 1

## 2020-03-23 MED ORDER — QUETIAPINE FUMARATE 50 MG PO TABS
50.0000 mg | ORAL_TABLET | Freq: Three times a day (TID) | ORAL | Status: DC
  Administered 2020-03-23 – 2020-03-24 (×4): 50 mg via ORAL
  Filled 2020-03-23 (×6): qty 1

## 2020-03-23 NOTE — Progress Notes (Signed)
   03/23/20 2000  Psych Admission Type (Psych Patients Only)  Admission Status Involuntary  Psychosocial Assessment  Patient Complaints Anxiety  Eye Contact Fair  Facial Expression Animated;Anxious;Pensive  Affect Anxious;Appropriate to circumstance;Irritable;Preoccupied  Speech Logical/coherent;Pressured  Interaction Assertive;Attention-seeking;Intrusive;Sexually inappropriate  Motor Activity Fidgety;Restless;Slow  Appearance/Hygiene Unremarkable  Behavior Characteristics Cooperative  Mood Anxious  Thought Chartered certified accountant of ideas;Loose associations  Content Compulsions;Delusions;Obsessions;Preoccupation  Delusions Grandeur  Perception Derealization;Hallucinations  Hallucination Auditory  Judgment Poor  Confusion Mild  Danger to Self  Current suicidal ideation? Denies  Danger to Others  Danger to Others None reported or observed

## 2020-03-23 NOTE — Progress Notes (Signed)
[facility] MD Progress Note  03/23/2020 10:52 AM Steven Richardson  MRN:  [id number] Subjective:   Patient was noted to be inappropriate and hypersexual as discussed in the nursing note of 10:49 AM yesterday, patient once again instructed to not make such, and so forth.  The patient himself is now alert and oriented and generally cooperative with me he is showing some improvement.  No EPS or TD Principal Problem: Untreated mania Diagnosis: Active Problems:   Schizophrenia, unspecified ([facility])  Total Time spent with patient: 20 minutes  Past Psychiatric History: see eval  Past Medical History:  Past Medical History:  Diagnosis Date  . Paranoid schizophrenia ([facility])    History reviewed. No pertinent surgical history. Family History:  Family History  Problem Relation Age of Onset  . Healthy Mother   . Healthy Father    Family Psychiatric  History: see eval Social History:  Social History   Substance and Sexual Activity  Alcohol Use No     Social History   Substance and Sexual Activity  Drug Use No    Social History   Socioeconomic History  . Marital status: Single    Spouse name: Not on file  . Number of children: Not on file  . Years of education: Not on file  . Highest education level: Not on file  Occupational History  . Not on file  Tobacco Use  . Smoking status: Former Games developer  . Smokeless tobacco: Never Used  Substance and Sexual Activity  . Alcohol use: No  . Drug use: No  . Sexual activity: Yes  Other Topics Concern  . Not on file  Social History Narrative  . Not on file   Social Determinants of Health   Financial Resource Strain:   . Difficulty of Paying Living Expenses:   Food Insecurity:   . Worried About Programme researcher, broadcasting/film/video in the Last Year:   . Barista in the Last Year:   Transportation Needs:   . Freight forwarder (Medical):   Marland Kitchen Lack of Transportation (Non-Medical):   Physical Activity:   . Days of Exercise per Week:   . Minutes of  Exercise per Session:   Stress:   . Feeling of Stress :   Social Connections:   . Frequency of Communication with Friends and Family:   . Frequency of Social Gatherings with Friends and Family:   . Attends Religious Services:   . Active Member of Clubs or Organizations:   . Attends Banker Meetings:   Marland Kitchen Marital Status:     Sleep: Good  Appetite:  Good  Current Medications: Current Facility-Administered Medications  Medication Dose Route Frequency Provider Last Rate Last Admin  . acetaminophen (TYLENOL) tablet 650 mg  650 mg Oral Q4H PRN Charm Rings, NP   650 mg at 03/22/20 2331  . alum & mag hydroxide-simeth (MAALOX/MYLANTA) 200-200-20 MG/5ML suspension 30 mL  30 mL Oral Q6H PRN Charm Rings, NP      . divalproex (DEPAKOTE ER) 24 hr tablet 1,000 mg  1,000 mg Oral BID Charm Rings, NP   1,000 mg at 03/23/20 0748  . lithium carbonate (LITHOBID) CR tablet 300 mg  300 mg Oral Q12H Charm Rings, NP   300 mg at 03/23/20 0748  . LORazepam (ATIVAN) tablet 1 mg  1 mg Oral Q6H PRN Charm Rings, NP   1 mg at 03/22/20 2248  . magnesium hydroxide (MILK OF MAGNESIA) suspension 30 mL  30 mL Oral Daily PRN Patrecia Pour, NP      . melatonin tablet 3 mg  3 mg Oral QHS Patrecia Pour, NP   3 mg at 03/22/20 2248  . ondansetron (ZOFRAN) tablet 4 mg  4 mg Oral Q8H PRN Patrecia Pour, NP      . propranolol (INDERAL) tablet 20 mg  20 mg Oral BID Johnn Hai, MD   20 mg at 03/23/20 0748  . risperiDONE (RISPERDAL) tablet 4 mg  4 mg Oral BID Johnn Hai, MD   4 mg at 03/23/20 2505    Lab Results: No results found for this or any previous visit (from the past 92 hour(s)).  Blood Alcohol level:  Lab Results  Component Value Date   ETH <10 39/76/7341    Metabolic Disorder Labs: No results found for: HGBA1C, MPG No results found for: PROLACTIN No results found for: CHOL, TRIG, HDL, CHOLHDL, VLDL, LDLCALC  Physical Findings: AIMS: Facial and Oral Movements Muscles of  Facial Expression: None, normal Lips and Perioral Area: None, normal Jaw: None, normal Tongue: None, normal,Extremity Movements Upper (arms, wrists, hands, fingers): None, normal Lower (legs, knees, ankles, toes): None, normal, Trunk Movements Neck, shoulders, hips: None, normal, Overall Severity Severity of abnormal movements (highest score from questions above): None, normal Incapacitation due to abnormal movements: None, normal Patient's awareness of abnormal movements (rate only patient's report): No Awareness, Dental Status Current problems with teeth and/or dentures?: Yes Does patient usually wear dentures?: No  CIWA:    COWS:     Musculoskeletal: Strength & Muscle Tone: within normal limits Gait & Station: normal Patient leans: N/A  Psychiatric Specialty Exam: Physical Exam  Review of Systems  Blood pressure 122/80, pulse 90, temperature 97.7 F (36.5 C), temperature source Oral, resp. rate 20, height 5\' 11"  (1.803 m), weight 124.7 kg, SpO2 100 %.Body mass index is 38.35 kg/m.  General Appearance: Casual  Eye Contact:  Fair  Speech:  Normal Rate  Volume:  Normal  Mood:  Euthymic  Affect:  Restricted  Thought Process:  Irrelevant  Orientation:  Full (Time, Place, and Person)  Thought Content:  Illogical and Tangential  Suicidal Thoughts:  No  Homicidal Thoughts:  No  Memory:  Immediate;   Fair Recent;   Fair Remote;   Fair  Judgement:  poor  Insight:  Lacking  Psychomotor Activity:  Normal  Concentration:  Concentration: Good and Attention Span: Good  Recall:  [employer] of Knowledge:  Fair  Language:  Fair  Akathisia:  Negative  Handed:  Right  AIMS (if indicated):     Assets:  Resilience Social Support  ADL's:  Intact  Cognition:  WNL  Sleep:  Number of Hours: 9     Treatment Plan Summary: Daily contact with patient to assess and evaluate symptoms and progress in treatment and Medication management  Check lithium level and Depakote level in the  morning, Add benztropine, add quetiapine to augment antipsychotic therapy, discussed long-acting injectable  Johnn Hai, MD 03/23/2020, 10:52 AM

## 2020-03-23 NOTE — Progress Notes (Signed)
Pt took afternoon medications from this RN without incident.

## 2020-03-23 NOTE — Progress Notes (Signed)
Recreation Therapy Notes  Date: 4.22.21 Time: 0950 Location: [address] Dayroom   Group Topic: Leisure Education  Goal Area(s) Addresses:  Patient will identify positive leisure activities.  Patient will identify one positive benefit of participation in leisure activities.   Behavioral Response: Minimal  Intervention: Music, Dance  Activity:  LRT and patients participated in an activity which got the body moving.  The group did dance moves to show that just moving can get the heart rate up and make you feel good.  Education:  Leisure Education, Building control surveyor  Education Outcome: Acknowledges education/In group clarification offered/Needs additional education  Clinical Observations/Feedback: Pt participated in some of the dance moves.  Pt was more appropriate than in previous group but still needed some redirection.      Caroll Rancher, LRT/CTRS    Caroll Rancher A 03/23/2020 12:17 PM

## 2020-03-23 NOTE — Progress Notes (Signed)
Pt continues to be inappropriate with male staff members by making sexually driven comments and staring at other male pts in a seductive way.  Pt becomes upset with this RN when this RN atempts to give pt his medications.  Pt willingly takes  his medications when a male nurse gives pt the medications.  RN will continue to monitor, provide support and implement interventions as needed.

## 2020-03-24 LAB — VALPROIC ACID LEVEL: Valproic Acid Lvl: 60 ug/mL (ref 50.0–100.0)

## 2020-03-24 LAB — LITHIUM LEVEL: Lithium Lvl: 0.3 mmol/L — ABNORMAL LOW (ref 0.60–1.20)

## 2020-03-24 MED ORDER — QUETIAPINE FUMARATE 50 MG PO TABS
50.0000 mg | ORAL_TABLET | Freq: Every day | ORAL | Status: DC
  Administered 2020-03-25 – 2020-03-27 (×3): 50 mg via ORAL
  Filled 2020-03-24 (×3): qty 1
  Filled 2020-03-24: qty 7
  Filled 2020-03-24: qty 1

## 2020-03-24 MED ORDER — QUETIAPINE FUMARATE 100 MG PO TABS
100.0000 mg | ORAL_TABLET | Freq: Every day | ORAL | Status: DC
  Administered 2020-03-24: 100 mg via ORAL
  Filled 2020-03-24 (×4): qty 1

## 2020-03-24 MED ORDER — LITHIUM CARBONATE ER 300 MG PO TBCR
300.0000 mg | EXTENDED_RELEASE_TABLET | Freq: Every day | ORAL | Status: DC
  Administered 2020-03-25 – 2020-03-27 (×3): 300 mg via ORAL
  Filled 2020-03-24 (×2): qty 1
  Filled 2020-03-24: qty 21
  Filled 2020-03-24: qty 1
  Filled 2020-03-24: qty 21
  Filled 2020-03-24: qty 1

## 2020-03-24 MED ORDER — LITHIUM CARBONATE ER 300 MG PO TBCR
600.0000 mg | EXTENDED_RELEASE_TABLET | Freq: Every evening | ORAL | Status: DC
  Administered 2020-03-24 – 2020-03-26 (×3): 600 mg via ORAL
  Filled 2020-03-24 (×5): qty 2

## 2020-03-24 MED ORDER — RISPERIDONE 2 MG PO TABS
2.0000 mg | ORAL_TABLET | Freq: Two times a day (BID) | ORAL | Status: DC
  Administered 2020-03-24 – 2020-03-25 (×2): 2 mg via ORAL
  Filled 2020-03-24 (×6): qty 1

## 2020-03-24 NOTE — Progress Notes (Signed)
Pt continues to be flirtatious with male staff members, but today this appears to have improved in intensity.  Pt continues to need redirection when talking to the opposite sex while on the unit.  Pt has taken his medications without incident. RN will continue to monitor and provide support as needed.

## 2020-03-24 NOTE — Progress Notes (Signed)
[facility] MD Progress Note  03/24/2020 2:57 PM Steven Richardson  MRN:  [id number] Subjective: Patient is a 39 year old male with a past psychiatric history significant for schizoaffective disorder versus schizophrenia who was admitted on 03/21/2020 secondary to delusional believes, hallucinations and inappropriate sexual activity.  Objective: Patient is seen and examined.  Patient is a 39 year old male with the above-stated past psychiatric history who is seen in follow-up.  On examination today he continues to deny that he has been inappropriate or hypersexual on the unit.  Today seen in his room, and he has moved his mattress to the floor.  He sleeping on the floor.  He denied any auditory or visual hallucinations.  He denied any suicidal or homicidal ideation.  He denied any side effects to his current medications.  He is requesting discharge.  The last note from Dr. Jeannine Kitten on 4/22 confirmed that he continued to be inappropriate and hypersexual.  He was noted that he was somewhat cooperative and showed some degree of improvement.  His vital signs are stable, he is afebrile.  He slept 9 hours last night.  Review of his laboratories on admission showed a glucose that was elevated at 173 but otherwise normal electrolytes.  Essentially normal CBC.  Differential was normal.  His lithium level on admission was 0.3.  Depakote level on 4/23 was 60.  Hemoglobin A1c was not obtained.  Drug screen was negative.  EKG showed a sinus tach with a normal QTc interval.  His current medications include Cogentin, Depakote, lithium, Zofran, Adderall, Seroquel and Risperdal.  Principal Problem: <principal problem not specified> Diagnosis: Active Problems:   Schizophrenia, unspecified (HCC)  Total Time spent with patient: 20 minutes  Past Psychiatric History: See admission H&P  Past Medical History:  Past Medical History:  Diagnosis Date  . Paranoid schizophrenia (HCC)    History reviewed. No pertinent surgical  history. Family History:  Family History  Problem Relation Age of Onset  . Healthy Mother   . Healthy Father    Family Psychiatric  History: see admission H&P Social History:  Social History   Substance and Sexual Activity  Alcohol Use No     Social History   Substance and Sexual Activity  Drug Use No    Social History   Socioeconomic History  . Marital status: Single    Spouse name: Not on file  . Number of children: Not on file  . Years of education: Not on file  . Highest education level: Not on file  Occupational History  . Not on file  Tobacco Use  . Smoking status: Former Games developer  . Smokeless tobacco: Never Used  Substance and Sexual Activity  . Alcohol use: No  . Drug use: No  . Sexual activity: Yes  Other Topics Concern  . Not on file  Social History Narrative  . Not on file   Social Determinants of Health   Financial Resource Strain:   . Difficulty of Paying Living Expenses:   Food Insecurity:   . Worried About Programme researcher, broadcasting/film/video in the Last Year:   . Barista in the Last Year:   Transportation Needs:   . Freight forwarder (Medical):   Marland Kitchen Lack of Transportation (Non-Medical):   Physical Activity:   . Days of Exercise per Week:   . Minutes of Exercise per Session:   Stress:   . Feeling of Stress :   Social Connections:   . Frequency of Communication with Friends and Family:   .  Frequency of Social Gatherings with Friends and Family:   . Attends Religious Services:   . Active Member of Clubs or Organizations:   . Attends Banker Meetings:   Marland Kitchen Marital Status:    Additional Social History:                         Sleep: Good  Appetite:  Good  Current Medications: Current Facility-Administered Medications  Medication Dose Route Frequency Provider Last Rate Last Admin  . acetaminophen (TYLENOL) tablet 650 mg  650 mg Oral Q4H PRN Charm Rings, NP   650 mg at 03/22/20 2331  . alum & mag hydroxide-simeth  (MAALOX/MYLANTA) 200-200-20 MG/5ML suspension 30 mL  30 mL Oral Q6H PRN Charm Rings, NP      . benztropine (COGENTIN) tablet 1 mg  1 mg Oral BID Malvin Johns, MD   1 mg at 03/24/20 0813  . divalproex (DEPAKOTE ER) 24 hr tablet 1,000 mg  1,000 mg Oral BID Charm Rings, NP   1,000 mg at 03/24/20 0813  . [START ON 03/25/2020] lithium carbonate (LITHOBID) CR tablet 300 mg  300 mg Oral Daily Antonieta Pert, MD      . lithium carbonate (LITHOBID) CR tablet 600 mg  600 mg Oral QPM Antonieta Pert, MD      . LORazepam (ATIVAN) tablet 1 mg  1 mg Oral Q6H PRN Charm Rings, NP   1 mg at 03/23/20 2030  . magnesium hydroxide (MILK OF MAGNESIA) suspension 30 mL  30 mL Oral Daily PRN Charm Rings, NP      . ondansetron [facility]) tablet 4 mg  4 mg Oral Q8H PRN Charm Rings, NP      . propranolol (INDERAL) tablet 20 mg  20 mg Oral BID Malvin Johns, MD   20 mg at 03/24/20 5366  . QUEtiapine (SEROQUEL) tablet 100 mg  100 mg Oral QHS Antonieta Pert, MD      . Melene Muller ON 03/25/2020] QUEtiapine (SEROQUEL) tablet 50 mg  50 mg Oral Daily Antonieta Pert, MD      . risperiDONE (RISPERDAL) tablet 2 mg  2 mg Oral BID Antonieta Pert, MD        Lab Results:  Results for orders placed or performed during the hospital encounter of 03/20/20 (from the past 48 hour(s))  Lithium level     Status: Abnormal   Collection Time: 03/24/20  6:16 AM  Result Value Ref Range   Lithium Lvl 0.30 (L) 0.60 - 1.20 mmol/L    Comment: Performed at [facility], [address] [place], [place] [address]  Valproic acid level     Status: None   Collection Time: 03/24/20  6:16 AM  Result Value Ref Range   Valproic Acid Lvl 60 50.0 - 100.0 ug/mL    Comment: Performed at [facility], [address] [place], [place] [address]    Blood Alcohol level:  Lab Results  Component Value Date   ETH <10 03/18/2020    Metabolic Disorder Labs: No results found for: HGBA1C, MPG No  results found for: PROLACTIN No results found for: CHOL, TRIG, HDL, CHOLHDL, VLDL, LDLCALC  Physical Findings: AIMS: Facial and Oral Movements Muscles of Facial Expression: None, normal Lips and Perioral Area: None, normal Jaw: None, normal Tongue: None, normal,Extremity Movements Upper (arms, wrists, hands, fingers): None, normal Lower (legs, knees, ankles, toes): None, normal, Trunk Movements Neck,  shoulders, hips: None, normal, Overall Severity Severity of abnormal movements (highest score from questions above): None, normal Incapacitation due to abnormal movements: None, normal Patient's awareness of abnormal movements (rate only patient's report): No Awareness, Dental Status Current problems with teeth and/or dentures?: Yes Does patient usually wear dentures?: No  CIWA:    COWS:     Musculoskeletal: Strength & Muscle Tone: within normal limits Gait & Station: normal Patient leans: N/A  Psychiatric Specialty Exam: Physical Exam  Nursing note and vitals reviewed. Constitutional: He is oriented to person, place, and time. He appears well-developed and well-nourished.  HENT:  Head: Normocephalic and atraumatic.  Respiratory: Effort normal.  Neurological: He is alert and oriented to person, place, and time.    Review of Systems  Blood pressure 108/85, pulse 89, temperature 98 F (36.7 C), temperature source Oral, resp. rate 20, height 5\' 11"  (1.803 m), weight 124.7 kg, SpO2 100 %.Body mass index is 38.35 kg/m.  General Appearance: Disheveled  Eye Contact:  Fair  Speech:  Normal Rate  Volume:  Increased  Mood:  Anxious  Affect:  Congruent  Thought Process:  Coherent and Descriptions of Associations: Circumstantial  Orientation:  Full (Time, Place, and Person)  Thought Content:  Negative  Suicidal Thoughts:  No  Homicidal Thoughts:  No  Memory:  Immediate;   Poor Recent;   Poor Remote;   Poor  Judgement:  Impaired  Insight:  Lacking  Psychomotor Activity:  Normal   Concentration:  Concentration: Fair and Attention Span: Fair  Recall:  [employer] of Knowledge:  Fair  Language:  Fair  Akathisia:  Negative  Handed:  Right  AIMS (if indicated):     Assets:  Desire for Improvement Resilience  ADL's:  Impaired  Cognition:  WNL  Sleep:  Number of Hours: 9     Treatment Plan Summary: Daily contact with patient to assess and evaluate symptoms and progress in treatment, Medication management and Plan : Patient is seen and examined.  Patient is a 39 year old male with the above-stated past psychiatric history who is seen in follow-up.   Diagnosis: #1 schizophrenia versus schizoaffective disorder  Patient is seen in follow-up.  He seems to be doing better from descriptions in the chart.  I also looked in the old medical record and found a discharge summary from [facility] in December 2020.  He was on long-acting paliperidone at that time.  He continues on lithium, Depakote, Seroquel and Risperdal.  I am going to try and consolidate these medicines so at least it twice daily instead of 3 times daily.  I am going to continue the Depakote at 1000 mg p.o. every afternoon.  I am also going to change his lithium to 300 mg p.o. daily and 600 mg p.o. every afternoon.  I will also change his Seroquel from 3 times daily to 50 mg p.o. daily and 100 mg p.o. nightly.  We will change the Risperdal to 2 mg p.o. twice daily.  Hopefully the consolidation will improve compliance, but also control his illness.  1.  Continue Cogentin 1 mg p.o. twice daily for side effects of medication. 2.  Continue Depakote ER at 1000 mg p.o. twice daily for mood stability. 3.  Continue lithium carbonate CR 300 mg p.o. daily and 600 mg p.o. every afternoon for mood stability. 4.  Continue lorazepam 1 mg p.o. every 6 hours as needed anxiety or agitation. 5.  Continue Zofran 4 mg p.o. every 8 hours as needed nausea.  6.  Continue propranolol 20 mg p.o. twice daily for hypertension. 7.   Continue Seroquel 50 mg p.o. daily and 100 mg p.o. nightly for mood stability. 8.  Continue Risperdal 2 mg p.o. twice daily for mood stability and psychosis. 9.  Check hemoglobin A1c in a.m. 10.  Disposition planning-in progress.  Antonieta Pert, MD 03/24/2020, 2:57 PM

## 2020-03-24 NOTE — Progress Notes (Signed)
Pt continues to be on no roommate status because of sexual comments and gestures made at other pt's.

## 2020-03-24 NOTE — Progress Notes (Signed)
Adult Psychoeducational Group Note  Date:  03/24/2020 Time:  11:13 PM  Group Topic/Focus:  Wrap-Up Group:   The focus of this group is to help patients review their daily goal of treatment and discuss progress on daily workbooks.  Participation Level:  Minimal  Participation Quality:  Appropriate  Affect:  Anxious, Appropriate and Irritable  Cognitive:  Appropriate  Insight: Limited  Engagement in Group:  Limited  Modes of Intervention:  Discussion  Additional Comments:  Pt stated his goal for today was to focus on his treatment plan. Pt stated he felt his relationship with his family has improved since he was admitted here. Pt stated been able to contact his wife today improved his day.Pt stated he felt better about himself today. Pt rated his overall day on an 6 out of 10. Pt stated his appetite was pretty good today. Pt stated his slept last night was pretty good. Pt stated he was in no physical pain. Pt deny auditory or visual hallucinations. Pt denies thoughts of harming himself or others. Pt stated that he would alert staff if anything changes.  Felipa Furnace 03/24/2020, 11:13 PM

## 2020-03-24 NOTE — Progress Notes (Signed)
   03/24/20 2100  Psych Admission Type (Psych Patients Only)  Admission Status Involuntary  Psychosocial Assessment  Patient Complaints Anxiety  Eye Contact Fair  Facial Expression Animated;Anxious;Pensive  Affect Anxious;Irritable;Preoccupied  Speech Logical/coherent;Pressured  Interaction Assertive;Attention-seeking;Intrusive;Sexually inappropriate  Motor Activity Fidgety;Restless;Slow  Appearance/Hygiene Unremarkable  Behavior Characteristics Anxious  Mood Suspicious;Anxious  Thought Chartered certified accountant of ideas;Loose associations  Content Compulsions;Delusions;Obsessions;Preoccupation  Delusions Grandeur  Perception Hallucinations  Hallucination Auditory  Judgment Poor  Confusion Mild  Danger to Self  Current suicidal ideation? Denies  Danger to Others  Danger to Others None reported or observed

## 2020-03-24 NOTE — Progress Notes (Signed)
Recreation Therapy Notes  Date: 4.23.21 Time: 1000 Location: [address] Hall Dayroom   Group Topic: Leisure Education  Goal Area(s) Addresses:  Patient will identify positive leisure activities.  Patient will identify one positive benefit of participation in leisure activities.   Behavioral Response: Minimal  Intervention: Leisure Group Game  Activity: Patients, MHT, and LRT participated in Stage manager.  Each person was to pick a word out of the container.  They had one minute to draw it on the board.  The remaining group members were to guess the picture.  The person who guesses the picture gets the next turn.  Education:  Leisure Education, Building control surveyor  Education Outcome: Acknowledges education/In group clarification offered/Needs additional education  Clinical Observations/Feedback: Patient participated in group.  Pt was less inappropriate.  Pt also seemed drowsy and would doze off at times.    Caroll Rancher, LRT/CTRS    Lillia Abed, Starlina Lapre A 03/24/2020 11:59 AM

## 2020-03-25 LAB — HEMOGLOBIN A1C
Hgb A1c MFr Bld: 5 % (ref 4.8–5.6)
Mean Plasma Glucose: 96.8 mg/dL

## 2020-03-25 MED ORDER — QUETIAPINE FUMARATE 200 MG PO TABS: 200.0000 mg | ORAL_TABLET | Freq: Every day | ORAL | Status: DC

## 2020-03-25 MED ORDER — RISPERIDONE 2 MG PO TABS
2.0000 mg | ORAL_TABLET | Freq: Every day | ORAL | Status: DC
  Administered 2020-03-25 – 2020-03-26 (×2): 2 mg via ORAL
  Filled 2020-03-25 (×4): qty 1

## 2020-03-25 MED ORDER — QUETIAPINE FUMARATE 400 MG PO TABS
400.0000 mg | ORAL_TABLET | Freq: Every day | ORAL | Status: DC
  Administered 2020-03-25 – 2020-03-26 (×2): 400 mg via ORAL
  Filled 2020-03-25 (×3): qty 1
  Filled 2020-03-25: qty 7

## 2020-03-25 MED ORDER — RISPERIDONE 1 MG PO TABS
1.0000 mg | ORAL_TABLET | Freq: Every day | ORAL | Status: DC
  Administered 2020-03-26 – 2020-03-27 (×2): 1 mg via ORAL
  Filled 2020-03-25 (×4): qty 1

## 2020-03-25 NOTE — Progress Notes (Signed)
Patient visible on the unit this evening.   Presents guarded with sullen affect, soft and logical speech, ambulatory with a steady gait. Denies SI, HI, AVH and pain at this time. Pleasant and cooperative on approach. Compliant with medications. Denies any issues or concerns this am. Pt remains on Q 15 minutes safety checks and without self harm gestures. Writer encouraged pt to voice concerns. Pt remains safe on unit.

## 2020-03-25 NOTE — Progress Notes (Signed)
Adult Psychoeducational Group Note  Date:  03/25/2020 Time:  10:14 PM  Group Topic/Focus:  Wrap-Up Group:   The focus of this group is to help patients review their daily goal of treatment and discuss progress on daily workbooks.  Participation Level:  Active  Participation Quality:  Appropriate  Affect:  Appropriate  Cognitive:  Appropriate  Insight: Appropriate  Engagement in Group:  Developing/Improving  Modes of Intervention:  Discussion  Additional Comments:   Pt stated his goal for today was to focus on his treatment plan. Pt stated he felt his relationship with his family has improved since he was admitted here. Pt stated been able to contact his wife today improved his day.Pt stated he felt better about himself today. Pt rated his overall day on a 10. Pt stated his appetite was pretty good today. Pt stated his slept last night was pretty good. Pt stated he was in no physical pain. Pt deny auditory or visual hallucinations. Pt denies thoughts of harming himself or others. Pt stated that he would alert staff if anything changes.  Felipa Furnace 03/25/2020, 10:14 PM

## 2020-03-25 NOTE — Progress Notes (Signed)
[facility] MD Progress Note  03/25/2020 11:45 AM Steven Richardson  MRN:  [id number] Subjective:  Patient is a 39 year old male with a past psychiatric history significant for schizoaffective disorder versus schizophrenia who was admitted on 03/21/2020 secondary to delusional believes, hallucinations and inappropriate sexual activity.  Objective: Patient is seen and examined.  Patient is a 39 year old male with the above-stated past psychiatric history who is seen in follow-up.  Patient is essentially unchanged.  He denied any inappropriate behaviors.  He has remained flirtatious with male staff members.  But was felt to be somewhat improved during the course of the day secondary to the intensity of his previous actions.  His blood pressure remains mildly elevated at 129/95.  He is afebrile.  He slept 5 hours last night.  Review of his laboratories revealed a blood sugar of 96.8 on 4/23.  His lithium level on 4/23 was 0.3.  Depakote level was 60.  He denied any auditory or visual hallucinations.  He denied any suicidal or homicidal ideation.  Principal Problem: <principal problem not specified> Diagnosis: Active Problems:   Schizophrenia, unspecified ([place])  Total Time spent with patient: 20 minutes  Past Psychiatric History: See admission H&P  Past Medical History:  Past Medical History:  Diagnosis Date  . Paranoid schizophrenia ([place])    History reviewed. No pertinent surgical history. Family History:  Family History  Problem Relation Age of Onset  . Healthy Mother   . Healthy Father    Family Psychiatric  History: see admission H&P Social History:  Social History   Substance and Sexual Activity  Alcohol Use No     Social History   Substance and Sexual Activity  Drug Use No    Social History   Socioeconomic History  . Marital status: Single    Spouse name: Not on file  . Number of children: Not on file  . Years of education: Not on file  . Highest education level: Not on file   Occupational History  . Not on file  Tobacco Use  . Smoking status: Former Research scientist (life sciences)  . Smokeless tobacco: Never Used  Substance and Sexual Activity  . Alcohol use: No  . Drug use: No  . Sexual activity: Yes  Other Topics Concern  . Not on file  Social History Narrative  . Not on file   Social Determinants of Health   Financial Resource Strain:   . Difficulty of Paying Living Expenses:   Food Insecurity:   . Worried About Charity fundraiser in the Last Year:   . Arboriculturist in the Last Year:   Transportation Needs:   . Film/video editor (Medical):   Marland Kitchen Lack of Transportation (Non-Medical):   Physical Activity:   . Days of Exercise per Week:   . Minutes of Exercise per Session:   Stress:   . Feeling of Stress :   Social Connections:   . Frequency of Communication with Friends and Family:   . Frequency of Social Gatherings with Friends and Family:   . Attends Religious Services:   . Active Member of Clubs or Organizations:   . Attends Archivist Meetings:   Marland Kitchen Marital Status:    Additional Social History:                         Sleep: Fair  Appetite:  Good  Current Medications: Current Facility-Administered Medications  Medication Dose Route Frequency Provider Last Rate Last Admin  .  acetaminophen (TYLENOL) tablet 650 mg  650 mg Oral Q4H PRN Charm Rings, NP   650 mg at 03/22/20 2331  . alum & mag hydroxide-simeth (MAALOX/MYLANTA) 200-200-20 MG/5ML suspension 30 mL  30 mL Oral Q6H PRN Charm Rings, NP   30 mL at 03/24/20 1949  . benztropine (COGENTIN) tablet 1 mg  1 mg Oral BID Malvin Johns, MD   1 mg at 03/25/20 0840  . divalproex (DEPAKOTE ER) 24 hr tablet 1,000 mg  1,000 mg Oral BID Charm Rings, NP   1,000 mg at 03/25/20 0839  . lithium carbonate (LITHOBID) CR tablet 300 mg  300 mg Oral Daily Antonieta Pert, MD   300 mg at 03/25/20 0839  . lithium carbonate (LITHOBID) CR tablet 600 mg  600 mg Oral QPM Antonieta Pert,  MD   600 mg at 03/24/20 1825  . LORazepam (ATIVAN) tablet 1 mg  1 mg Oral Q6H PRN Charm Rings, NP   1 mg at 03/24/20 2044  . magnesium hydroxide (MILK OF MAGNESIA) suspension 30 mL  30 mL Oral Daily PRN Charm Rings, NP      . ondansetron [facility]) tablet 4 mg  4 mg Oral Q8H PRN Charm Rings, NP      . propranolol (INDERAL) tablet 20 mg  20 mg Oral BID Malvin Johns, MD   20 mg at 03/25/20 0840  . QUEtiapine (SEROQUEL) tablet 100 mg  100 mg Oral QHS Antonieta Pert, MD   100 mg at 03/24/20 2044  . QUEtiapine (SEROQUEL) tablet 50 mg  50 mg Oral Daily Antonieta Pert, MD   50 mg at 03/25/20 0841  . risperiDONE (RISPERDAL) tablet 2 mg  2 mg Oral BID Antonieta Pert, MD   2 mg at 03/25/20 7619    Lab Results:  Results for orders placed or performed during the hospital encounter of 03/20/20 (from the past 48 hour(s))  Lithium level     Status: Abnormal   Collection Time: 03/24/20  6:16 AM  Result Value Ref Range   Lithium Lvl 0.30 (L) 0.60 - 1.20 mmol/L    Comment: Performed at [facility], [address] [place], [place] [address]  Valproic acid level     Status: None   Collection Time: 03/24/20  6:16 AM  Result Value Ref Range   Valproic Acid Lvl 60 50.0 - 100.0 ug/mL    Comment: Performed at [facility], [address] [place], [place] [address]  Hemoglobin A1c     Status: None   Collection Time: 03/24/20  6:09 PM  Result Value Ref Range   Hgb A1c MFr Bld 5.0 4.8 - 5.6 %    Comment: (NOTE) Pre diabetes:          5.7%-6.4% Diabetes:              >6.4% Glycemic control for   <7.0% adults with diabetes    Mean Plasma Glucose 96.8 mg/dL    Comment: Performed at [facility], [address], [place], [place] [address]    Blood Alcohol level:  Lab Results  Component Value Date   ETH <10 03/18/2020    Metabolic Disorder Labs: Lab Results  Component Value Date   HGBA1C 5.0 03/24/2020   MPG 96.8 03/24/2020   No  results found for: PROLACTIN No results found for: CHOL, TRIG, HDL, CHOLHDL, VLDL, LDLCALC  Physical Findings: AIMS: Facial and Oral Movements Muscles of Facial  Expression: None, normal Lips and Perioral Area: None, normal Jaw: None, normal Tongue: None, normal,Extremity Movements Upper (arms, wrists, hands, fingers): None, normal Lower (legs, knees, ankles, toes): None, normal, Trunk Movements Neck, shoulders, hips: None, normal, Overall Severity Severity of abnormal movements (highest score from questions above): None, normal Incapacitation due to abnormal movements: None, normal Patient's awareness of abnormal movements (rate only patient's report): No Awareness, Dental Status Current problems with teeth and/or dentures?: Yes Does patient usually wear dentures?: No  CIWA:    COWS:     Musculoskeletal: Strength & Muscle Tone: within normal limits Gait & Station: normal Patient leans: N/A  Psychiatric Specialty Exam: Physical Exam  Nursing note and vitals reviewed. Constitutional: He is oriented to person, place, and time. He appears well-developed and well-nourished.  HENT:  Head: Normocephalic and atraumatic.  Respiratory: Effort normal.  Neurological: He is alert and oriented to person, place, and time.    Review of Systems  Blood pressure (!) 126/96, pulse 83, temperature 97.9 F (36.6 C), temperature source Oral, resp. rate 20, height 5\' 11"  (1.803 m), weight 124.7 kg, SpO2 100 %.Body mass index is 38.35 kg/m.  General Appearance: Disheveled  Eye Contact:  Fair  Speech:  Normal Rate  Volume:  Normal  Mood:  Anxious  Affect:  Congruent  Thought Process:  Goal Directed and Descriptions of Associations: Circumstantial  Orientation:  Full (Time, Place, and Person)  Thought Content:  Negative  Suicidal Thoughts:  No  Homicidal Thoughts:  No  Memory:  Immediate;   Fair Recent;   Fair Remote;   Fair  Judgement:  Impaired  Insight:  Lacking  Psychomotor Activity:   Normal  Concentration:  Concentration: Fair and Attention Span: Fair  Recall:  of Knowledge:  Fair  Language:  Fair  Akathisia:  Negative  Handed:  Right  AIMS (if indicated):     Assets:  Desire for Improvement Housing Resilience  ADL's:  Intact  Cognition:  WNL  Sleep:  Number of Hours: 5     Treatment Plan Summary: Daily contact with patient to assess and evaluate symptoms and progress in treatment, Medication management and Plan : Patient is seen and examined.  Patient is a 39 year old male with the above-stated past psychiatric history who is seen in follow-up.   Diagnosis: #1 schizophrenia versus schizoaffective disorder  Patient is seen in follow-up.  He continues to slowly improve.  We will continue his medications as currently written except I am going to increase his Seroquel to 200 mg p.o. nightly.  I will try and reduce his daytime Risperdal to 1 mg, but continue the 2 mg at at bedtime.  No other changes in his medications.  We will check a lithium level in 1 to 2 days.  1.  Continue Cogentin 1 mg p.o. twice daily for side effects of medication. 2.  Continue Depakote ER 1000 mg p.o. twice daily for mood stability. 3.  Continue lithium carbonate 300 mg p.o. daily and 600 mg p.o. every afternoon for mood stability. 4.  Continue lorazepam 1 mg p.o. every 6 hours as needed anxiety. 5.  Continue Zofran 4 mg p.o. every 8 hours as needed nausea. 6.  Continue propranolol 20 mg p.o. twice daily for hypertension. 7.  Increase Seroquel to 50 mg p.o. daily and 400 mg p.o. nightly for mood stability.  Also for sleep. 8.  Increase Risperdal to 1 mg p.o. daily and 2 mg p.o. nightly. 9.  Lithium level, Depakote level, complete  metabolic panel, CBC with differential, and TSH in a.m. 4/26. 10.  Disposition planning-in progress.  Antonieta Pert, MD 03/25/2020, 11:45 AM

## 2020-03-25 NOTE — Progress Notes (Signed)
   03/25/20 0935  Psych Admission Type (Psych Patients Only)  Admission Status Involuntary  Psychosocial Assessment  Patient Complaints Anxiety  Eye Contact Fair  Facial Expression Animated;Anxious  Affect Appropriate to circumstance  Speech Logical/coherent;Pressured  Interaction Assertive;Attention-seeking  Motor Activity Fidgety;Pacing  Appearance/Hygiene In scrubs  Behavior Characteristics Cooperative  Mood Anxious;Suspicious;Preoccupied (hyper-religious & sexually preoccupied.)  Thought Chartered certified accountant of ideas;Loose associations  Content Compulsions;Delusions;Preoccupation;Obsessions  Delusions Grandeur;Religious  Perception Hallucinations  Hallucination Auditory  Judgment Poor  Confusion Mild  Danger to Self  Current suicidal ideation? Denies  Danger to Others  Danger to Others None reported or observed

## 2020-03-25 NOTE — Plan of Care (Signed)
  Problem: Activity: Goal: Interest or engagement in leisure activities will improve Outcome: Progressing   Problem: Self-Concept: Goal: Level of anxiety will decrease Outcome: Progressing

## 2020-03-26 NOTE — [facility] Suicide Risk Assessment (Signed)
[facility] INPATIENT:  Family/Significant Other Suicide Prevention Education  Suicide Prevention Education:  Contact Attempts:  [name], wife, [phone], (name of family member/significant other) has been identified by the patient as the family member/significant other with whom the patient will be residing, and identified as the person(s) who will aid the patient in the event of a mental health crisis.  With written consent from the patient, two attempts were made to provide suicide prevention education, prior to and/or following the patient's discharge.  We were unsuccessful in providing suicide prevention education.  A suicide education pamphlet was given to the patient to share with family/significant other.  Date and time of first attempt:  03/26/2020  / 3:27 PM  HIPAA-compliant VM left Date and time of second attempt:   CSW team to continue to follow  Lynnell Chad 03/26/2020, 3:27 PM

## 2020-03-26 NOTE — Progress Notes (Signed)
[facility] MD Progress Note  03/26/2020 11:03 AM Steven Richardson  MRN:  [id number] Subjective:  Patient is a 39 year old male with a past psychiatric history significant for schizoaffective disorder versus schizophrenia who was admitted on 03/21/2020 secondary to delusional believes, hallucinations and inappropriate sexual activity.  Objective: Patient is seen and examined.  Patient is a 39 year old male with the above-stated past psychiatric history is seen in follow-up.  He stated he is doing well.  He stated he is not having any side effects of medications.  Nursing notes were reviewed, and it does not look like he has had any major inappropriate activity over the last 24 hours.  He denied any auditory or visual hallucinations.  He denied any suicidal or homicidal ideation.  No gross evidence of any acute psychosis or delusion.  His vital signs are stable, he is afebrile.  He slept 6.25 hours last night.  His lithium level from 4/23 was 0.3.  Depakote was 60.  He has new lithium level, Depakote level, CBC with differential and complete metabolic panel ordered for tomorrow in the a.m.  He did request whether or not he could have a nurse stay in the home with him.  I told him that we would be unable to arrange that but that his act services might come up with something.  Principal Problem: <principal problem not specified> Diagnosis: Active Problems:   Schizophrenia, unspecified ([facility])  Total Time spent with patient: 15 minutes  Past Psychiatric History: See admission H&P  Past Medical History:  Past Medical History:  Diagnosis Date  . Paranoid schizophrenia ([facility])    History reviewed. No pertinent surgical history. Family History:  Family History  Problem Relation Age of Onset  . Healthy Mother   . Healthy Father    Family Psychiatric  History: See admission H&P Social History:  Social History   Substance and Sexual Activity  Alcohol Use No     Social History   Substance and Sexual  Activity  Drug Use No    Social History   Socioeconomic History  . Marital status: Single    Spouse name: Not on file  . Number of children: Not on file  . Years of education: Not on file  . Highest education level: Not on file  Occupational History  . Not on file  Tobacco Use  . Smoking status: Former Games developer  . Smokeless tobacco: Never Used  Substance and Sexual Activity  . Alcohol use: No  . Drug use: No  . Sexual activity: Yes  Other Topics Concern  . Not on file  Social History Narrative  . Not on file   Social Determinants of Health   Financial Resource Strain:   . Difficulty of Paying Living Expenses:   Food Insecurity:   . Worried About Programme researcher, broadcasting/film/video in the Last Year:   . Barista in the Last Year:   Transportation Needs:   . Freight forwarder (Medical):   Marland Kitchen Lack of Transportation (Non-Medical):   Physical Activity:   . Days of Exercise per Week:   . Minutes of Exercise per Session:   Stress:   . Feeling of Stress :   Social Connections:   . Frequency of Communication with Friends and Family:   . Frequency of Social Gatherings with Friends and Family:   . Attends Religious Services:   . Active Member of Clubs or Organizations:   . Attends Banker Meetings:   Marland Kitchen Marital Status:  Additional Social History:                         Sleep: Good  Appetite:  Good  Current Medications: Current Facility-Administered Medications  Medication Dose Route Frequency Provider Last Rate Last Admin  . acetaminophen (TYLENOL) tablet 650 mg  650 mg Oral Q4H PRN Patrecia Pour, NP   650 mg at 03/22/20 2331  . alum & mag hydroxide-simeth (MAALOX/MYLANTA) 200-200-20 MG/5ML suspension 30 mL  30 mL Oral Q6H PRN Patrecia Pour, NP   30 mL at 03/25/20 1350  . benztropine (COGENTIN) tablet 1 mg  1 mg Oral BID Johnn Hai, MD   1 mg at 03/26/20 0734  . divalproex (DEPAKOTE ER) 24 hr tablet 1,000 mg  1,000 mg Oral BID Patrecia Pour,  NP   1,000 mg at 03/26/20 0736  . lithium carbonate (LITHOBID) CR tablet 300 mg  300 mg Oral Daily Sharma Covert, MD   300 mg at 03/26/20 0735  . lithium carbonate (LITHOBID) CR tablet 600 mg  600 mg Oral QPM Sharma Covert, MD   600 mg at 03/25/20 1705  . LORazepam (ATIVAN) tablet 1 mg  1 mg Oral Q6H PRN Patrecia Pour, NP   1 mg at 03/24/20 2044  . magnesium hydroxide (MILK OF MAGNESIA) suspension 30 mL  30 mL Oral Daily PRN Patrecia Pour, NP      . ondansetron [facility]) tablet 4 mg  4 mg Oral Q8H PRN Patrecia Pour, NP      . propranolol (INDERAL) tablet 20 mg  20 mg Oral BID Johnn Hai, MD   20 mg at 03/26/20 0734  . QUEtiapine (SEROQUEL) tablet 400 mg  400 mg Oral QHS Sharma Covert, MD   400 mg at 03/25/20 2021  . QUEtiapine (SEROQUEL) tablet 50 mg  50 mg Oral Daily Sharma Covert, MD   50 mg at 03/26/20 0734  . risperiDONE (RISPERDAL) tablet 1 mg  1 mg Oral Daily Sharma Covert, MD   1 mg at 03/26/20 0734  . risperiDONE (RISPERDAL) tablet 2 mg  2 mg Oral QHS Sharma Covert, MD   2 mg at 03/25/20 2021    Lab Results:  Results for orders placed or performed during the hospital encounter of 03/20/20 (from the past 48 hour(s))  Hemoglobin A1c     Status: None   Collection Time: 03/24/20  6:09 PM  Result Value Ref Range   Hgb A1c MFr Bld 5.0 4.8 - 5.6 %    Comment: (NOTE) Pre diabetes:          5.7%-6.4% Diabetes:              >6.4% Glycemic control for   <7.0% adults with diabetes    Mean Plasma Glucose 96.8 mg/dL    Comment: Performed at [facility], [place] [address], [place], [place] [address]    Blood Alcohol level:  Lab Results  Component Value Date   ETH <10 76/19/5093    Metabolic Disorder Labs: Lab Results  Component Value Date   HGBA1C 5.0 03/24/2020   MPG 96.8 03/24/2020   No results found for: PROLACTIN No results found for: CHOL, TRIG, HDL, CHOLHDL, VLDL, LDLCALC  Physical Findings: AIMS: Facial and Oral Movements Muscles  of Facial Expression: None, normal Lips and Perioral Area: None, normal Jaw: None, normal Tongue: None, normal,Extremity Movements Upper (arms, wrists, hands, fingers): None, normal Lower (legs,  knees, ankles, toes): None, normal, Trunk Movements Neck, shoulders, hips: None, normal, Overall Severity Severity of abnormal movements (highest score from questions above): None, normal Incapacitation due to abnormal movements: None, normal Patient's awareness of abnormal movements (rate only patient's report): No Awareness, Dental Status Current problems with teeth and/or dentures?: Yes Does patient usually wear dentures?: No  CIWA:    COWS:     Musculoskeletal: Strength & Muscle Tone: within normal limits Gait & Station: normal Patient leans: N/A  Psychiatric Specialty Exam: Physical Exam  Nursing note and vitals reviewed. Constitutional: He is oriented to person, place, and time. He appears well-developed and well-nourished.  HENT:  Head: Normocephalic and atraumatic.  Respiratory: Effort normal.  Neurological: He is alert and oriented to person, place, and time.    Review of Systems  Blood pressure 115/74, pulse 100, temperature 97.7 F (36.5 C), temperature source Oral, resp. rate 20, height 5\' 11"  (1.803 m), weight 124.7 kg, SpO2 100 %.Body mass index is 38.35 kg/m.  General Appearance: Casual  Eye Contact:  Good  Speech:  Normal Rate  Volume:  Normal  Mood:  Euthymic  Affect:  Congruent  Thought Process:  Coherent and Descriptions of Associations: Intact  Orientation:  Full (Time, Place, and Person)  Thought Content:  Logical  Suicidal Thoughts:  No  Homicidal Thoughts:  No  Memory:  Immediate;   Fair Recent;   Fair Remote;   Fair  Judgement:  Intact  Insight:  Fair  Psychomotor Activity:  Normal  Concentration:  Concentration: Fair and Attention Span: Fair  Recall:  of Knowledge:  Fair  Language:  Fair  Akathisia:  Negative  Handed:  Right  AIMS (if  indicated):     Assets:  Desire for Improvement Housing Resilience Social Support  ADL's:  Intact  Cognition:  WNL  Sleep:  Number of Hours: 6.25     Treatment Plan Summary: Daily contact with patient to assess and evaluate symptoms and progress in treatment, Medication management and Plan : Patient is seen and examined.  Patient is a 39 year old male with the above-stated past psychiatric history who is seen in follow-up.   Diagnosis: #1 schizophrenia versus schizoaffective disorder  Patient is seen in follow-up.  He continues to slowly improve.  No noted hypersexual activity yesterday.  No change in his current medications.  We will check lithium levels as well as Depakote levels in the a.m. tomorrow.  If all goes well we will plan discharge tomorrow.  1.  Continue Cogentin 1 mg p.o. twice daily for side effects of medication. 2.  Continue Depakote ER 1000 mg p.o. twice daily for mood stability. 3.  Continue lithium carbonate CR 300 mg p.o. daily and 600 mg p.o. every afternoon for mood stability. 3.  Continue lorazepam 1 mg p.o. every 6 hours as needed anxiety or agitation. 4.  Continue Zofran 4 mg p.o. every 8 hours as needed nausea or vomiting. 5.  Continue Inderal 20 mg p.o. twice daily for tachycardia and hypertension. 6.  Continue Seroquel 50 mg p.o. nightly a.m. and 400 mg p.o. nightly for mood stability and psychosis. 7.  Continue Risperdal 1 mg p.o. daily and 2 mg p.o. nightly for mood stability and psychosis. 8.  Obtain lithium level, Depakote level, CBC with differential, complete metabolic panel and TSH in a.m. tomorrow. 9.  Disposition planning-in progress.  30, MD 03/26/2020, 11:03 AM

## 2020-03-26 NOTE — Progress Notes (Signed)
   03/26/20 0945  Psych Admission Type (Psych Patients Only)  Admission Status Involuntary  Psychosocial Assessment  Patient Complaints None  Eye Contact Fair  Facial Expression Anxious  Affect Appropriate to circumstance  Speech Logical/coherent  Interaction Minimal  Motor Activity Other (Comment) (WNL)  Appearance/Hygiene In scrubs  Behavior Characteristics Cooperative  Mood Pleasant  Thought Process  Coherency Circumstantial  Content Delusions;Compulsions  Delusions Religious  Perception Hallucinations  Hallucination Auditory  Judgment Poor  Confusion Mild  Danger to Self  Current suicidal ideation? Denies  Danger to Others  Danger to Others None reported or observed

## 2020-03-26 NOTE — Plan of Care (Signed)
  Problem: Coping: Goal: Ability to identify and develop effective coping behavior will improve Outcome: Progressing   Problem: Self-Concept: Goal: Level of anxiety will decrease Outcome: Progressing   

## 2020-03-27 LAB — COMPREHENSIVE METABOLIC PANEL
ALT: 33 U/L (ref 0–44)
AST: 17 U/L (ref 15–41)
Albumin: 4.1 g/dL (ref 3.5–5.0)
Alkaline Phosphatase: 48 U/L (ref 38–126)
Anion gap: 10 (ref 5–15)
BUN: 14 mg/dL (ref 6–20)
CO2: 28 mmol/L (ref 22–32)
Calcium: 9.4 mg/dL (ref 8.9–10.3)
Chloride: 104 mmol/L (ref 98–111)
Creatinine, Ser: 0.9 mg/dL (ref 0.61–1.24)
GFR calc Af Amer: >60 mL/min (ref >60)
GFR calc non Af Amer: >60 mL/min (ref >60)
Glucose, Bld: 87 mg/dL (ref 70–99)
Potassium: 3.6 mmol/L (ref 3.5–5.1)
Sodium: 142 mmol/L (ref 135–145)
Total Bilirubin: 1.5 mg/dL — ABNORMAL HIGH (ref 0.3–1.2)
Total Protein: 7.2 g/dL (ref 6.5–8.1)

## 2020-03-27 LAB — CBC WITH DIFFERENTIAL/PLATELET
Abs Immature Granulocytes: 0.06 10*3/uL (ref 0.00–0.07)
Basophils Absolute: 0.1 10*3/uL (ref 0.0–0.1)
Basophils Relative: 1 %
Eosinophils Absolute: 0.5 10*3/uL (ref 0.0–0.5)
Eosinophils Relative: 4 %
HCT: 48.4 % (ref 39.0–52.0)
Hemoglobin: 16.6 g/dL (ref 13.0–17.0)
Immature Granulocytes: 1 %
Lymphocytes Relative: 45 %
Lymphs Abs: 4.7 10*3/uL — ABNORMAL HIGH (ref 0.7–4.0)
MCH: 30.5 pg (ref 26.0–34.0)
MCHC: 34.3 g/dL (ref 30.0–36.0)
MCV: 88.8 fL (ref 80.0–100.0)
Monocytes Absolute: 0.6 10*3/uL (ref 0.1–1.0)
Monocytes Relative: 6 %
Neutro Abs: 4.4 10*3/uL (ref 1.7–7.7)
Neutrophils Relative %: 43 %
Platelets: 268 10*3/uL (ref 150–400)
RBC: 5.45 MIL/uL (ref 4.22–5.81)
RDW: 12 % (ref 11.5–15.5)
WBC: 10.3 10*3/uL (ref 4.0–10.5)
nRBC: 0 % (ref 0.0–0.2)

## 2020-03-27 LAB — TSH: TSH: 3.605 u[IU]/mL (ref 0.350–4.500)

## 2020-03-27 LAB — LITHIUM LEVEL: Lithium Lvl: 0.35 mmol/L — ABNORMAL LOW (ref 0.60–1.20)

## 2020-03-27 LAB — VALPROIC ACID LEVEL: Valproic Acid Lvl: 49 ug/mL — ABNORMAL LOW (ref 50.0–100.0)

## 2020-03-27 MED ORDER — RISPERIDONE 2 MG PO TABS: 3.0000 mg | ORAL_TABLET | Freq: Every day | ORAL | 0 refills | Status: DC

## 2020-03-27 MED ORDER — BENZTROPINE MESYLATE 1 MG PO TABS: 1.0000 mg | ORAL_TABLET | Freq: Two times a day (BID) | ORAL | 0 refills | Status: DC

## 2020-03-27 MED ORDER — QUETIAPINE FUMARATE 400 MG PO TABS: 400.0000 mg | ORAL_TABLET | Freq: Every day | ORAL | 0 refills | Status: DC

## 2020-03-27 MED ORDER — DIVALPROEX SODIUM ER 500 MG PO TB24: 1000.0000 mg | ORAL_TABLET | Freq: Two times a day (BID) | ORAL | 0 refills | Status: DC

## 2020-03-27 MED ORDER — PROPRANOLOL HCL 20 MG PO TABS: 20.0000 mg | ORAL_TABLET | Freq: Two times a day (BID) | ORAL | 0 refills | Status: DC

## 2020-03-27 MED ORDER — RISPERIDONE 3 MG PO TABS
1.5000 mg | ORAL_TABLET | Freq: Every day | ORAL | Status: DC
  Filled 2020-03-27: qty 4

## 2020-03-27 MED ORDER — RISPERIDONE 3 MG PO TABS
3.0000 mg | ORAL_TABLET | Freq: Every day | ORAL | Status: DC
  Filled 2020-03-27: qty 1

## 2020-03-27 MED ORDER — LITHIUM CARBONATE ER 300 MG PO TBCR: EXTENDED_RELEASE_TABLET | ORAL | 0 refills | Status: DC

## 2020-03-27 MED ORDER — QUETIAPINE FUMARATE 50 MG PO TABS: 50.0000 mg | ORAL_TABLET | Freq: Every day | ORAL | 0 refills | Status: DC

## 2020-03-27 NOTE — Plan of Care (Signed)
Pt was able to participate in recreation therapy group sessions without demonstrating more than 2 impulsive behaviors.   Caroll Rancher, LRT/CTRS

## 2020-03-27 NOTE — Progress Notes (Signed)
   03/26/20 2200  Psych Admission Type (Psych Patients Only)  Admission Status Involuntary  Psychosocial Assessment  Patient Complaints None  Eye Contact Fair  Facial Expression Flat  Affect Preoccupied  Speech Logical/coherent  Interaction Minimal  Motor Activity Other (Comment) (WNL)  Appearance/Hygiene Unremarkable  Behavior Characteristics Hypersexual;Cooperative  Mood Pleasant;Preoccupied  Thought Process  Coherency Circumstantial  Content Preoccupation  Delusions WDL  Perception WDL  Hallucination None reported or observed  Judgment Poor   Pt A&O, calm & interactive in day room with peers. Began making inappropriate comment to RN but self corrected and apologized. Denies SI/HI/ AVH, med compliant. Needs met as able, safety maintained. Monitoring continues.

## 2020-03-27 NOTE — Progress Notes (Signed)
  [facility] Adult Case Management Discharge Plan :  Will you be returning to the same living situation after discharge:  Yes,  home. At discharge, do you have transportation home?: Yes,  wife will pick up at 11:00am. Do you have the ability to pay for your medications: No. Provided samples, has ACTT services.  Release of information consent forms completed and in the chart.  Patient to Follow up at: Follow-up Information    [facility] Follow up.   Why: Your ACT Team will meet with you in the community within 24 hours of discharge. [phone] (Crisis Line) Contact information: [address] [place] [address] [phone]           Next level of care provider has access to [facility] Link:no  Safety Planning and Suicide Prevention discussed: Yes,  with wife.  Has patient been referred to the Quitline?: N/A patient is not a smoker  Patient has been referred for addiction treatment: Yes  Darreld Mclean, LCSWA 03/27/2020, 9:40 AM

## 2020-03-27 NOTE — Progress Notes (Signed)
Recreation Therapy Notes  Date: 4.26.21 Time: 1000 Location: 500 Hall Dayroom  Group Topic: Coping Skills  Goal Area(s) Addresses:  Patient will identify positive coping skills. Patient will identify benefit of using coping skills post d/c.  Behavioral Response: Minimal  Intervention: Worksheet  Activity: Mind Map.  LRT and patients filled in the first 8 boxes of the mind map with depression, boredom, loss of loved one, stress, humility, guilt, fear and loneliness.  Patients were then given time to come up with at least 3 coping skills for each instance where coping skills would be used.  Education:Coping Skills, Discharge Planning.   Education Outcome: Acknowledges understanding/In group clarification offered/Needs additional education.   Clinical Observations/Feedback: Pt was mostly observant during group.  Pt left early and did not return.     Caroll Rancher, LRT/CTRS        Caroll Rancher A 03/27/2020 12:32 PM

## 2020-03-27 NOTE — Progress Notes (Signed)
Recreation Therapy Notes  INPATIENT RECREATION TR PLAN  Patient Details Name: Steven Richardson MRN: [id number] DOB: Oct 15, 1981 Today's Date: 03/27/2020  Rec Therapy Plan Is patient appropriate for Therapeutic Recreation?: Yes Treatment times per week: about 3 days Estimated Length of Stay: 5-7 days TR Treatment/Interventions: Group participation (Comment)  Discharge Criteria Pt will be discharged from therapy if:: Discharged Treatment plan/goals/alternatives discussed and agreed upon by:: Patient/family  Discharge Summary Short term goals set: See patient care plan Short term goals met: Complete Progress toward goals comments: Groups attended Which groups?: Wellness, Coping skills, Leisure education, Other (Comment)(Team building) Reason goals not met: None Therapeutic equipment acquired: N/A Reason patient discharged from therapy: Discharge from hospital Pt/family agrees with progress & goals achieved: Yes Date patient discharged from therapy: 03/27/20      Victorino Sparrow, LRT/CTRS   Ria Comment, Rishawn Walck A 03/27/2020, 12:46 PM

## 2020-03-27 NOTE — Progress Notes (Signed)
Pt awake in room, sitting and reading for several hours through NOC. Then resting in bed with with eyes closed unlabored respirations. Safety maintained with q15 min rounds. Monitoring continues.

## 2020-03-27 NOTE — [facility] Suicide Risk Assessment (Signed)
[facility] Discharge Suicide Risk Assessment   Principal Problem: <principal problem not specified> Discharge Diagnoses: Active Problems:   Schizophrenia, unspecified (HCC)   Total Time spent with patient: 20 minutes  Musculoskeletal: Strength & Muscle Tone: within normal limits Gait & Station: normal Patient leans: N/A  Psychiatric Specialty Exam: Review of Systems  All other systems reviewed and are negative.   Blood pressure 99/70, pulse (!) 105, temperature 97.6 F (36.4 C), temperature source Oral, resp. rate 20, height 5\' 11"  (1.803 m), weight 124.7 kg, SpO2 100 %.Body mass index is 38.35 kg/m.  General Appearance: Casual  Eye Contact::  Fair  Speech:  Normal Rate409  Volume:  Normal  Mood:  Euthymic  Affect:  Congruent  Thought Process:  Coherent and Descriptions of Associations: Intact  Orientation:  Full (Time, Place, and Person)  Thought Content:  Logical  Suicidal Thoughts:  No  Homicidal Thoughts:  No  Memory:  Immediate;   Fair Recent;   Fair Remote;   Fair  Judgement:  Intact  Insight:  Fair  Psychomotor Activity:  Normal  Concentration:  Fair  Recall:  [id number] of Knowledge:Good  Language: Good  Akathisia:  Negative  Handed:  Right  AIMS (if indicated):     Assets:  Desire for Improvement Housing Resilience Social Support  Sleep:  Number of Hours: 4  Cognition: WNL  ADL's:  Intact   Mental Status Per Nursing Assessment::   On Admission:  Plan to harm others  Demographic Factors:  Male, Low socioeconomic status and Unemployed  Loss Factors: Financial problems/change in socioeconomic status  Historical Factors: Impulsivity  Risk Reduction Factors:   Sense of responsibility to family, Living with another person, especially a relative and Positive social support  Continued Clinical Symptoms:  Schizophrenia:   Paranoid or undifferentiated type  Cognitive Features That Contribute To Risk:  Thought constriction (tunnel vision)    Suicide  Risk:  Minimal: No identifiable suicidal ideation.  Patients presenting with no risk factors but with morbid ruminations; may be classified as minimal risk based on the severity of the depressive symptoms  Follow-up Information    [facility] Follow up.   Why: Your ACT Team will meet with you in the community within 24 hours of discharge. [phone] (Crisis Line) Contact information: [address] [phone] [place] [phone]           Plan Of Care/Follow-up recommendations:  Activity:  ad lib  [phone], MD 03/27/2020, 11:05 AM

## 2020-03-27 NOTE — B[facility] Counselor (Signed)
CSW called [facility]T's Crisis Line [phone]) to notify team of patient's discharge. ACTT clinician requested a discharge summary to be faxed, no additional questions or concerns.  Enid Cutter, MSW, LCSW-A Clinical Social Worker [facility] Adult Unit

## 2020-03-27 NOTE — [facility] Counselor (Signed)
CSW notified Care Coordinator, Remigio Eisenmenger [phone] of discharge and follow up.  Enid Cutter, MSW, LCSW-A Clinical Social Worker [facility] Adult Unit

## 2020-03-27 NOTE — Tx Team (Signed)
Interdisciplinary Treatment and Diagnostic Plan Update  03/27/2020 Time of Session: 9:00am Steven Richardson MRN: [id number]  Principal Diagnosis: <principal problem not specified>  Secondary Diagnoses: Active Problems:   Schizophrenia, unspecified (HCC)   Current Medications:  Current Facility-Administered Medications  Medication Dose Route Frequency Provider Last Rate Last Admin  . acetaminophen (TYLENOL) tablet 650 mg  650 mg Oral Q4H PRN Charm Rings, NP   650 mg at 03/22/20 2331  . alum & mag hydroxide-simeth (MAALOX/MYLANTA) 200-200-20 MG/5ML suspension 30 mL  30 mL Oral Q6H PRN Charm Rings, NP   30 mL at 03/25/20 1350  . benztropine (COGENTIN) tablet 1 mg  1 mg Oral BID Malvin Johns, MD   1 mg at 03/27/20 0820  . divalproex (DEPAKOTE ER) 24 hr tablet 1,000 mg  1,000 mg Oral BID Charm Rings, NP   1,000 mg at 03/27/20 3500  . lithium carbonate (LITHOBID) CR tablet 300 mg  300 mg Oral Daily Antonieta Pert, MD   300 mg at 03/27/20 0820  . lithium carbonate (LITHOBID) CR tablet 600 mg  600 mg Oral QPM Antonieta Pert, MD   600 mg at 03/26/20 1736  . LORazepam (ATIVAN) tablet 1 mg  1 mg Oral Q6H PRN Charm Rings, NP   1 mg at 03/24/20 2044  . magnesium hydroxide (MILK OF MAGNESIA) suspension 30 mL  30 mL Oral Daily PRN Charm Rings, NP      . ondansetron [facility]) tablet 4 mg  4 mg Oral Q8H PRN Charm Rings, NP      . propranolol (INDERAL) tablet 20 mg  20 mg Oral BID Malvin Johns, MD   20 mg at 03/27/20 0820  . QUEtiapine (SEROQUEL) tablet 400 mg  400 mg Oral QHS Antonieta Pert, MD   400 mg at 03/26/20 2047  . QUEtiapine (SEROQUEL) tablet 50 mg  50 mg Oral Daily Antonieta Pert, MD   50 mg at 03/27/20 9381  . risperiDONE (RISPERDAL) tablet 1 mg  1 mg Oral Daily Antonieta Pert, MD   1 mg at 03/27/20 0820  . risperiDONE (RISPERDAL) tablet 2 mg  2 mg Oral QHS Antonieta Pert, MD   2 mg at 03/26/20 2047   PTA Medications: Medications Prior to  Admission  Medication Sig Dispense Refill Last Dose  . lithium carbonate (LITHOBID) 300 MG CR tablet Take 300 mg by mouth at bedtime.      Marland Kitchen omeprazole (PRILOSEC) 20 MG capsule Take 2 capsules (40 mg total) by mouth 2 (two) times daily before a meal. (Patient not taking: Reported on 03/18/2020) 60 capsule 1   . paliperidone (INVEGA SUSTENNA) 234 MG/1.5ML SUSY injection Inject 1.5 mLs into the muscle every 30 (thirty) days.     . [DISCONTINUED] divalproex (DEPAKOTE ER) 500 MG 24 hr tablet Take 1,000 mg by mouth 2 (two) times daily.        Patient Stressors: Medication change or noncompliance  Patient Strengths: Capable of independent living Physical Health Religious Affiliation Supportive family/friends  Treatment Modalities: Medication Management, Group therapy, Case management,  1 to 1 session with clinician, Psychoeducation, Recreational therapy.   Physician Treatment Plan for Primary Diagnosis: <principal problem not specified> Long Term Goal(s): Improvement in symptoms so as ready for discharge Improvement in symptoms so as ready for discharge   Short Term Goals: Ability to demonstrate self-control will improve Ability to demonstrate self-control will improve  Medication Management: Evaluate patient's response, side effects, and tolerance of  medication regimen.  Therapeutic Interventions: 1 to 1 sessions, Unit Group sessions and Medication administration.  Evaluation of Outcomes: Adequate for Discharge  Physician Treatment Plan for Secondary Diagnosis: Active Problems:   Schizophrenia, unspecified ([place])  Long Term Goal(s): Improvement in symptoms so as ready for discharge Improvement in symptoms so as ready for discharge   Short Term Goals: Ability to demonstrate self-control will improve Ability to demonstrate self-control will improve     Medication Management: Evaluate patient's response, side effects, and tolerance of medication regimen.  Therapeutic Interventions: 1  to 1 sessions, Unit Group sessions and Medication administration.  Evaluation of Outcomes: Adequate for Discharge   RN Treatment Plan for Primary Diagnosis: <principal problem not specified> Long Term Goal(s): Knowledge of disease and therapeutic regimen to maintain health will improve  Short Term Goals: Ability to identify and develop effective coping behaviors will improve and Compliance with prescribed medications will improve  Medication Management: RN will administer medications as ordered by provider, will assess and evaluate patient's response and provide education to patient for prescribed medication. RN will report any adverse and/or side effects to prescribing provider.  Therapeutic Interventions: 1 on 1 counseling sessions, Psychoeducation, Medication administration, Evaluate responses to treatment, Monitor vital signs and CBGs as ordered, Perform/monitor CIWA, COWS, AIMS and Fall Risk screenings as ordered, Perform wound care treatments as ordered.  Evaluation of Outcomes: Adequate for Discharge   LCSW Treatment Plan for Primary Diagnosis: <principal problem not specified> Long Term Goal(s): Safe transition to appropriate next level of care at discharge, Engage patient in therapeutic group addressing interpersonal concerns.  Short Term Goals: Engage patient in aftercare planning with referrals and resources, Increase ability to appropriately verbalize feelings, Facilitate acceptance of mental health diagnosis and concerns, Identify triggers associated with mental health/substance abuse issues and Increase skills for wellness and recovery  Therapeutic Interventions: Assess for all discharge needs, 1 to 1 time with Social worker, Explore available resources and support systems, Assess for adequacy in community support network, Educate family and significant other(s) on suicide prevention, Complete Psychosocial Assessment, Interpersonal group therapy.  Evaluation of Outcomes:  Adequate for Discharge  Progress in Treatment: Attending groups: Yes. Participating in groups: Yes. Taking medication as prescribed: Yes. Toleration medication: Yes. Family/Significant other contact made: Yes, individual(s) contacted:  wife. Patient understands diagnosis: No. Discussing patient identified problems/goals with staff: Yes. Medical problems stabilized or resolved: Yes. Denies suicidal/homicidal ideation: Yes.  Issues/concerns per patient self-inventory: Yes.  New problem(s) identified: Yes, Describe:  sexually inappropriate with staff  New Short Term/Long Term Goal(s): medication management for mood stabilization; elimination of SI thoughts; development of comprehensive mental wellness/sobriety plan.  Patient Goals: "I'm ready to leave."  Discharge Plan or Barriers: Patient is established with [facility] for ACTT services  Reason for Continuation of Hospitalization: Delusions  Hallucinations Medication stabilization  Estimated Length of Stay: discharging today back home.  Attendees: Patient: Steven Richardson 03/27/2020 10:02 AM  Physician: Dr.Farah Dr.Clary 03/27/2020 10:02 AM  Nursing:  03/27/2020 10:02 AM  RN Care Manager: 03/27/2020 10:02 AM  Social Worker: Stephanie Acre, [place] 03/27/2020 10:02 AM  Recreational Therapist:  03/27/2020 10:02 AM  Other:  03/27/2020 10:02 AM  Other:  03/27/2020 10:02 AM  Other: 03/27/2020 10:02 AM    Scribe for Treatment Team: Joellen Jersey, [place] 03/27/2020 10:02 AM

## 2020-03-27 NOTE — [facility] Suicide Risk Assessment (Signed)
[facility] INPATIENT:  Family/Significant Other Suicide Prevention Education  Suicide Prevention Education:  Education Completed; Steven Richardson, wife, [phone] has been identified by the patient as the family member/significant other with whom the patient will be residing, and identified as the person(s) who will aid the patient in the event of a mental health crisis (suicidal ideations/suicide attempt).  With written consent from the patient, the family member/significant other has been provided the following suicide prevention education, prior to the and/or following the discharge of the patient.  The suicide prevention education provided includes the following:  Suicide risk factors  Suicide prevention and interventions  National Suicide Hotline telephone number  [facility] assessment telephone number  [facility] Emergency Assistance 911  [facility] and/or Residential Mobile Crisis Unit telephone number  Request made of family/significant other to:  Remove weapons (e.g., guns, rifles, knives), all items previously/currently identified as safety concern.    Remove drugs/medications (over-the-counter, prescriptions, illicit drugs), all items previously/currently identified as a safety concern.  The family member/significant other verbalizes understanding of the suicide prevention education information provided.  The family member/significant other agrees to remove the items of safety concern listed above.   Wife asked CSW to review medication list with her. CSW informed wife that patient's ACT Team was notified of discharge as well. Wife inquired if patient was still talking to himself, CSW shared that patient has improved but was still exhibiting delusional thinking this morning. Wife voiced understanding and stated she is comfortable with him discharging, she will pick him up at 11:00am. "I hope he is better and can stay better."  CSW provided callback  information if wife has additional questions or concerns.  Steven Richardson 03/27/2020, 9:37 AM

## 2020-03-27 NOTE — Discharge Summary (Signed)
Physician Discharge Summary Note  Patient:  Steven Richardson is an 39 y.o., male MRN:  [id number] DOB:  1980-12-07 Patient phone:  [phone] (home)  Patient address:   [address] [place] [address],  Total Time spent with patient: 15 minutes  Date of Admission:  03/20/2020 Date of Discharge: 03/27/20  Reason for Admission:  Acute psychosis  Principal Problem: <principal problem not specified> Discharge Diagnoses: Active Problems:   Schizophrenia, unspecified (HCC)   Past Psychiatric History: Longstanding history of psychotic disorder/poor compliance/on long-acting injectable paliperidone, uncertain of last dose.  Past Medical History:  Past Medical History:  Diagnosis Date  . Paranoid schizophrenia (HCC)    History reviewed. No pertinent surgical history. Family History:  Family History  Problem Relation Age of Onset  . Healthy Mother   . Healthy Father    Family Psychiatric  History: Denies Social History:  Social History   Substance and Sexual Activity  Alcohol Use No     Social History   Substance and Sexual Activity  Drug Use No    Social History   Socioeconomic History  . Marital status: Single    Spouse name: Not on file  . Number of children: Not on file  . Years of education: Not on file  . Highest education level: Not on file  Occupational History  . Not on file  Tobacco Use  . Smoking status: Former Games developer  . Smokeless tobacco: Never Used  Substance and Sexual Activity  . Alcohol use: No  . Drug use: No  . Sexual activity: Yes  Other Topics Concern  . Not on file  Social History Narrative  . Not on file   Social Determinants of Health   Financial Resource Strain:   . Difficulty of Paying Living Expenses:   Food Insecurity:   . Worried About Programme researcher, broadcasting/film/video in the Last Year:   . Barista in the Last Year:   Transportation Needs:   . Freight forwarder (Medical):   Marland Kitchen Lack of Transportation (Non-Medical):    Physical Activity:   . Days of Exercise per Week:   . Minutes of Exercise per Session:   Stress:   . Feeling of Stress :   Social Connections:   . Frequency of Communication with Friends and Family:   . Frequency of Social Gatherings with Friends and Family:   . Attends Religious Services:   . Active Member of Clubs or Organizations:   . Attends Banker Meetings:   Marland Kitchen Marital Status:     Hospital Course:  From admission H&P: This 39 year old patient required petition for involuntary commitment, due to an exacerbation in his underlying psychotic disorder, brought about by poor compliance, lack of insight, all resulting in sexually inappropriate behaviors, delusional believes, and hallucinations were also reported. The patient initially presented on 4/5 monitored until 4/6 and released to further services with his ACT team ([facility]) however he re-presented on 4/17 under petition. The patient himself states "doctors tell me I have illness but I do not believe them" he states he does not need medication.  Thus far on the ward he has been sexually inappropriate with students, with other patients, having to be redirected out of a male patient's room. He denies auditory or visual hallucinations but does express delusional beliefs when questioned.  He also makes some bizarre statements when questioned. According to laboratory work is Depakote level is 16, lithium level is negligible indicating noncompliance.  Drug screen  negative for illicit compounds.  Patient denies alcohol or drug abuse.  Mr. Mcguirt was admitted for acute mania with psychosis. He was intrusive, sexually inappropriate, and delusional on admission. He remained on the [facility] unit for seven days. Depakote was restarted. Lithium was increased. Seroquel and Risperdal were started. He participated in group therapy on the unit. He responded well to treatment with no adverse effects reported. He has shown more appropriate mood, affect,  sleep, and interaction. He denies any SI/HI/AVH and contracts for safety. He is less focused on delusional thought content. He is discharging on the medications listed below. He agrees to follow up with the [facility] ACT team (see below). Patient is provided with prescriptions for medications upon discharge. His wife is picking him up for discharge home.  Physical Findings: AIMS: Facial and Oral Movements Muscles of Facial Expression: None, normal Lips and Perioral Area: None, normal Jaw: None, normal Tongue: None, normal,Extremity Movements Upper (arms, wrists, hands, fingers): None, normal Lower (legs, knees, ankles, toes): None, normal, Trunk Movements Neck, shoulders, hips: None, normal, Overall Severity Severity of abnormal movements (highest score from questions above): None, normal Incapacitation due to abnormal movements: None, normal Patient's awareness of abnormal movements (rate only patient's report): No Awareness, Dental Status Current problems with teeth and/or dentures?: Yes Does patient usually wear dentures?: No  CIWA:    COWS:     Musculoskeletal: Strength & Muscle Tone: within normal limits Gait & Station: normal Patient leans: N/A  Psychiatric Specialty Exam: Physical Exam  Nursing note and vitals reviewed. Constitutional: He is oriented to person, place, and time. He appears well-developed and well-nourished.  Cardiovascular: Normal rate.  Respiratory: Effort normal.  Neurological: He is alert and oriented to person, place, and time.    Review of Systems  Constitutional: Negative.   Respiratory: Negative for cough and shortness of breath.   Psychiatric/Behavioral: Negative for agitation, behavioral problems, confusion, dysphoric mood, hallucinations, self-injury, sleep disturbance and suicidal ideas. The patient is not nervous/anxious and is not hyperactive.     Blood pressure 99/70, pulse (!) 105, temperature 97.6 F (36.4 C), temperature source Oral, resp. rate  20, height 5\' 11"  (1.803 m), weight 124.7 kg, SpO2 100 %.Body mass index is 38.35 kg/m.  See MD's discharge SRA      Has this patient used any form of tobacco in the last 30 days? (Cigarettes, Smokeless Tobacco, Cigars, and/or Pipes)  No  Blood Alcohol level:  Lab Results  Component Value Date   ETH <10 03/18/2020    Metabolic Disorder Labs:  Lab Results  Component Value Date   HGBA1C 5.0 03/24/2020   MPG 96.8 03/24/2020   No results found for: PROLACTIN No results found for: CHOL, TRIG, HDL, CHOLHDL, VLDL, LDLCALC  See Psychiatric Specialty Exam and Suicide Risk Assessment completed by Attending Physician prior to discharge.  Discharge destination:  Home  Is patient on multiple antipsychotic therapies at discharge:  Yes,   Do you recommend tapering to monotherapy for antipsychotics?  Yes   Has Patient had three or more failed trials of antipsychotic monotherapy by history:  Yes,   Antipsychotic medications that previously failed include:   1.  Invega., 2.  Risperdal . and 3.  Seroquel.  Recommended Plan for Multiple Antipsychotic Therapies: Taper to monotherapy as described:  Outpatient psychiatrist may taper to antipsychotic monotherapy as patient's symptoms allow.  Discharge Instructions    Discharge instructions   Complete by: As directed    Patient is instructed to take all prescribed medications  as recommended. Report any side effects or adverse reactions to your outpatient psychiatrist. Patient is instructed to abstain from alcohol and illegal drugs while on prescription medications. In the event of worsening symptoms, patient is instructed to call the crisis hotline, 911, or go to the nearest emergency department for evaluation and treatment.     Allergies as of 03/27/2020   No Known Allergies     Medication List    STOP taking these medications   omeprazole 20 MG capsule Commonly known as: PRILOSEC   paliperidone 234 MG/1.5ML Susy injection Commonly known  as: INVEGA SUSTENNA     TAKE these medications     Indication  benztropine 1 MG tablet Commonly known as: COGENTIN Take 1 tablet (1 mg total) by mouth 2 (two) times daily.  Indication: Extrapyramidal Reaction caused by Medications   divalproex 500 MG 24 hr tablet Commonly known as: DEPAKOTE ER Take 2 tablets (1,000 mg total) by mouth 2 (two) times daily.  Indication: MIXED BIPOLAR AFFECTIVE DISORDER   lithium carbonate 300 MG CR tablet Commonly known as: LITHOBID Take one tablet (300 mg) by mouth every morning and two tablets (600 mg) by mouth at bedtime. What changed:   how much to take  how to take this  when to take this  additional instructions  Indication: Manic-Depression   propranolol 20 MG tablet Commonly known as: INDERAL Take 1 tablet (20 mg total) by mouth 2 (two) times daily.  Indication: High Blood Pressure Disorder   QUEtiapine 400 MG tablet Commonly known as: SEROQUEL Take 1 tablet (400 mg total) by mouth at bedtime.  Indication: Manic Phase of Manic-Depression   QUEtiapine 50 MG tablet Commonly known as: SEROQUEL Take 1 tablet (50 mg total) by mouth daily.  Indication: Manic Phase of Manic-Depression   risperiDONE 2 MG tablet Commonly known as: RISPERDAL Take 1.5 tablets (3 mg total) by mouth at bedtime.  Indication: MIXED BIPOLAR AFFECTIVE DISORDER      Follow-up [facility] Follow up.   Why: Your ACT Team will meet with you in the community within 24 hours of discharge. [phone] (Crisis Line) Contact information: [place] [address] [phone]           Follow-up recommendations: Activity as tolerated. Diet as recommended by primary care physician. Keep all scheduled follow-up appointments as recommended.   Comments:   Patient is instructed to take all prescribed medications as recommended. Report any side effects or adverse reactions to your outpatient  psychiatrist. Patient is instructed to abstain from alcohol and illegal drugs while on prescription medications. In the event of worsening symptoms, patient is instructed to call the crisis hotline, 911, or go to the nearest emergency department for evaluation and treatment.  Signed: Connye Burkitt, NP 03/27/2020, 4:12 PM

## 2020-03-27 NOTE — Progress Notes (Signed)
D: Pt A & O X 2. Denies SI, HI, AVH and pain at this time. D/C home as ordered. Picked up in lobby by his wife.  A: D/C instructions reviewed with pt and his wife who is his care giver; including prescriptions, medication samples and follow up appointment with [facility]. Compliance encouraged. All belongings from assigned locker returned to pt at time of departure. Scheduled medications administered with verbal education and effects monitored. Safety checks maintained without incident till time of d/c.  R: Pt receptive to care. Compliant with medications when offered. Denies adverse drug reactions when assessed. Verbalized understanding related to d/c instructions. Signed belonging sheet in agreement with items received from locker. Ambulatory with a steady gait. Appears to be in no physical distress at time of departure.

## 2020-08-18 ENCOUNTER — Emergency Department (HOSPITAL_BASED_OUTPATIENT_CLINIC_OR_DEPARTMENT_OTHER)
Admission: EM | Admit: 2020-08-18 | Discharge: 2020-08-18 | Disposition: A | Discharge status: Home / Self Care | Payer: Self-pay | Attending: Emergency Medicine | Admitting: Emergency Medicine

## 2020-08-18 ENCOUNTER — Other Ambulatory Visit: Payer: Self-pay

## 2020-08-18 ENCOUNTER — Encounter (HOSPITAL_BASED_OUTPATIENT_CLINIC_OR_DEPARTMENT_OTHER): Payer: Self-pay

## 2020-08-18 DIAGNOSIS — Z5321 Procedure and treatment not carried out due to patient leaving prior to being seen by health care provider: Secondary | ICD-10-CM | POA: Insufficient documentation

## 2020-08-18 DIAGNOSIS — K5900 Constipation, unspecified: Secondary | ICD-10-CM | POA: Insufficient documentation

## 2020-08-18 NOTE — ED Triage Notes (Signed)
Pt c/o constipation x 3 months. Pt has painful BMs.

## 2020-09-01 ENCOUNTER — Other Ambulatory Visit: Payer: Self-pay

## 2020-09-01 ENCOUNTER — Encounter (HOSPITAL_BASED_OUTPATIENT_CLINIC_OR_DEPARTMENT_OTHER): Payer: Self-pay | Admitting: *Deleted

## 2020-09-01 ENCOUNTER — Emergency Department (HOSPITAL_BASED_OUTPATIENT_CLINIC_OR_DEPARTMENT_OTHER)
Admission: EM | Admit: 2020-09-01 | Discharge: 2020-09-01 | Disposition: A | Discharge status: Home / Self Care | Payer: Self-pay | Attending: Emergency Medicine | Admitting: Emergency Medicine

## 2020-09-01 DIAGNOSIS — Z5321 Procedure and treatment not carried out due to patient leaving prior to being seen by health care provider: Secondary | ICD-10-CM | POA: Insufficient documentation

## 2020-09-01 DIAGNOSIS — K6289 Other specified diseases of anus and rectum: Secondary | ICD-10-CM | POA: Insufficient documentation

## 2020-09-01 DIAGNOSIS — K5900 Constipation, unspecified: Secondary | ICD-10-CM | POA: Insufficient documentation

## 2020-09-01 NOTE — ED Triage Notes (Signed)
Rectal pain. States he feels constipated.

## 2020-09-02 ENCOUNTER — Emergency Department (INDEPENDENT_AMBULATORY_CARE_PROVIDER_SITE_OTHER): Payer: Self-pay

## 2020-09-02 ENCOUNTER — Encounter: Payer: Self-pay | Admitting: Emergency Medicine

## 2020-09-02 ENCOUNTER — Other Ambulatory Visit: Payer: Self-pay

## 2020-09-02 ENCOUNTER — Emergency Department (INDEPENDENT_AMBULATORY_CARE_PROVIDER_SITE_OTHER)
Admission: EM | Admit: 2020-09-02 | Discharge: 2020-09-02 | Disposition: A | Discharge status: Home / Self Care | Payer: Self-pay | Source: Home / Self Care

## 2020-09-02 DIAGNOSIS — K5900 Constipation, unspecified: Secondary | ICD-10-CM

## 2020-09-02 DIAGNOSIS — K219 Gastro-esophageal reflux disease without esophagitis: Secondary | ICD-10-CM

## 2020-09-02 HISTORY — DX: Constipation, unspecified: K59.00

## 2020-09-02 MED ORDER — HYDROCORTISONE ACETATE 25 MG RE SUPP
25.0000 mg | Freq: Two times a day (BID) | RECTAL | 0 refills | Status: DC
Start: 2020-09-02 — End: 2020-09-02

## 2020-09-02 MED ORDER — DOCUSATE SODIUM 250 MG PO CAPS
250.0000 mg | ORAL_CAPSULE | Freq: Every day | ORAL | 0 refills | Status: DC
Start: 2020-09-02 — End: 2020-09-02

## 2020-09-02 MED ORDER — POLYETHYLENE GLYCOL 3350 17 G PO PACK
17.0000 g | PACK | Freq: Every day | ORAL | 0 refills | Status: DC
Start: 2020-09-02 — End: 2023-09-05

## 2020-09-02 MED ORDER — POLYETHYLENE GLYCOL 3350 17 G PO PACK
17.0000 g | PACK | Freq: Every day | ORAL | 0 refills | Status: DC
Start: 2020-09-02 — End: 2020-09-02

## 2020-09-02 MED ORDER — OMEPRAZOLE 20 MG PO CPDR: 20.0000 mg | DELAYED_RELEASE_CAPSULE | Freq: Every day | ORAL | 0 refills | Status: DC

## 2020-09-02 MED ORDER — OMEPRAZOLE 20 MG PO CPDR
20.0000 mg | DELAYED_RELEASE_CAPSULE | Freq: Every day | ORAL | 0 refills | Status: DC
Start: 2020-09-02 — End: 2020-09-02

## 2020-09-02 MED ORDER — HYDROCORTISONE ACETATE 25 MG RE SUPP
25.0000 mg | Freq: Two times a day (BID) | RECTAL | 0 refills | Status: DC
Start: 2020-09-02 — End: 2020-12-07

## 2020-09-02 MED ORDER — DOCUSATE SODIUM 250 MG PO CAPS: 250.0000 mg | ORAL_CAPSULE | Freq: Every day | ORAL | 0 refills | Status: DC

## 2020-09-02 NOTE — ED Triage Notes (Signed)
Pt has ongoing issues with constipation for approx 2 weeks  No solid BM- only liquid per pt  Pt feels rectal pressure constantly Pt was at [facility] yesterday, left prior to being seen due to patients arguing in waiting area OTC laxative - no results (pt had same symptoms a month ago & resolved w/ laxatives) NO COVID vaccine

## 2020-09-02 NOTE — Discharge Instructions (Addendum)
  Please take the medication as prescribed and stay well hydrated   may take 2-3 days to have a large bowel movement.  Call to schedule an appointment with a gastroenterologist next week for further evaluation and treatment of symptoms.   Call 911 or have someone drive you to the hospital if symptoms significantly worsening including worsening abdominal pain, fever, vomiting, blood in stool, or other new concerning symptoms develop.

## 2020-09-02 NOTE — ED Provider Notes (Signed)
[facility]    CSN: [id number] Arrival date & time: 09/02/20  1532      History   Chief Complaint Chief Complaint  Patient presents with  . Constipation    HPI Lillian Fransisco Messmer is a 39 y.o. male.   HPI  Tobie Wolf Boulay is a 39 y.o. male presenting to UC with c/o intermittent constipation for about 2 weeks.  Generalized abdominal cramping. He has tried OTC laxative with mild relief.  He has had small amounts of loose stool and rectal pressure with BMs. No hx of abdominal surgeries or bowel obstruction in the past. He does c/o associated acid reflux, temporary relief with Tums. He is on disability and currently waiting for his insurance card to establish care with a PCP.  Denies fever, chills, nausea or vomiting. No recent change of medication or diet.    Past Medical History:  Diagnosis Date  . Constipation   . Paranoid schizophrenia [facility])     Patient Active Problem List   Diagnosis Date Noted  . Schizophrenia, unspecified (HCC) 03/20/2020  . Schizophrenia, history of multiple episodes, currently acute (HCC) 03/19/2020    History reviewed. No pertinent surgical history.     Home Medications    Prior to Admission medications   Medication Sig Start Date End Date Taking? Authorizing Provider  benztropine (COGENTIN) 1 MG tablet Take 1 tablet (1 mg total) by mouth 2 (two) times daily. 03/27/20   Aldean Baker, NP  divalproex (DEPAKOTE ER) 500 MG 24 hr tablet Take 2 tablets (1,000 mg total) by mouth 2 (two) times daily. 03/27/20   Aldean Baker, NP  docusate sodium (COLACE) 250 MG capsule Take 1 capsule (250 mg total) by mouth daily. 09/02/20   Lurene Shadow, PA-C  hydrocortisone (ANUSOL-HC) 25 MG suppository Place 1 suppository (25 mg total) rectally 2 (two) times daily. 09/02/20   Lurene Shadow, PA-C  lithium carbonate (LITHOBID) 300 MG CR tablet Take one tablet (300 mg) by mouth every morning and two tablets (600 mg) by mouth at bedtime. 03/27/20   Aldean Baker, NP  omeprazole (PRILOSEC) 20 MG capsule Take 1 capsule (20 mg total) by mouth daily. 09/02/20   Lurene Shadow, PA-C  polyethylene glycol (MIRALAX / GLYCOLAX) 17 g packet Take 17 g by mouth daily. 09/02/20   Lurene Shadow, PA-C  propranolol (INDERAL) 20 MG tablet Take 1 tablet (20 mg total) by mouth 2 (two) times daily. 03/27/20   Aldean Baker, NP  QUEtiapine (SEROQUEL) 400 MG tablet Take 1 tablet (400 mg total) by mouth at bedtime. 03/27/20   Aldean Baker, NP  QUEtiapine (SEROQUEL) 50 MG tablet Take 1 tablet (50 mg total) by mouth daily. 03/27/20   Aldean Baker, NP  risperiDONE (RISPERDAL) 2 MG tablet Take 1.5 tablets (3 mg total) by mouth at bedtime. 03/27/20   Aldean Baker, NP    Family History Family History  Problem Relation Age of Onset  . Healthy Mother   . Healthy Father     Social History Social History   Tobacco Use  . Smoking status: Former Games developer  . Smokeless tobacco: Never Used  Vaping Use  . Vaping Use: Never used  Substance Use Topics  . Alcohol use: No  . Drug use: No     Allergies   Patient has no known allergies.   Review of Systems Review of Systems  Constitutional: Negative for chills and fever.  Respiratory: Negative for chest  tightness and shortness of breath.   Cardiovascular: Negative for chest pain and palpitations.  Gastrointestinal: Positive for abdominal pain, constipation and rectal pain. Negative for diarrhea, nausea and vomiting.  Genitourinary: Negative for dysuria, flank pain, frequency and hematuria.  Musculoskeletal: Negative for back pain.     Physical Exam Triage Vital Signs ED Triage Vitals  Enc Vitals Group     BP 09/02/20 1543 (!) 148/97     Pulse Rate 09/02/20 1543 (!) 103     Resp 09/02/20 1543 19     Temp 09/02/20 1543 99.4 F (37.4 C)     Temp src --      SpO2 09/02/20 1543 99 %     Weight 09/02/20 1546 (!) 307 lb (139.3 kg)     Height 09/02/20 1546 5\' 11"  (1.803 m)     Head Circumference --      Peak  Flow --      Pain Score 09/02/20 1546 6     Pain Loc --      Pain Edu? --      Excl. in GC? --    No data found.  Updated Vital Signs BP (!) 148/97 (BP Location: Right Arm)   Pulse (!) 103   Temp 99.4 F (37.4 C)   Resp 19   Ht 5\' 11"  (1.803 m)   Wt (!) 307 lb (139.3 kg)   SpO2 99%   BMI 42.82 kg/m   Visual Acuity Right Eye Distance:   Left Eye Distance:   Bilateral Distance:    Right Eye Near:   Left Eye Near:    Bilateral Near:     Physical Exam Vitals and nursing note reviewed.  Constitutional:      General: He is not in acute distress.    Appearance: Normal appearance. He is well-developed. He is obese. He is not ill-appearing, toxic-appearing or diaphoretic.  HENT:     Head: Normocephalic and atraumatic.  Cardiovascular:     Rate and Rhythm: Normal rate and regular rhythm.  Pulmonary:     Effort: Pulmonary effort is normal. No respiratory distress.     Breath sounds: Normal breath sounds. No stridor. No wheezing, rhonchi or rales.  Abdominal:     General: Bowel sounds are normal. There is no distension.     Palpations: Abdomen is soft. There is no mass.     Tenderness: There is no abdominal tenderness. There is no right CVA tenderness, left CVA tenderness, guarding or rebound.     Hernia: No hernia is present.     Comments: Obese abdomen, soft, non-tender.  Musculoskeletal:        General: Normal range of motion.     Cervical back: Normal range of motion.  Skin:    General: Skin is warm and dry.  Neurological:     Mental Status: He is alert and oriented to person, place, and time.  Psychiatric:        Behavior: Behavior normal.      UC Treatments / Results  Labs (all labs ordered are listed, but only abnormal results are displayed) Labs Reviewed - No data to display  EKG   Radiology DG Abdomen 1 View  Result Date: 09/02/2020 CLINICAL DATA:  Constipation for 2 weeks. EXAM: ABDOMEN - 1 VIEW COMPARISON:  None. FINDINGS: The bowel gas pattern is  normal. A mild-to-moderate amount of stool is seen within the ascending colon. No radio-opaque calculi or other significant radiographic abnormality are seen. IMPRESSION: Negative. Electronically Signed  By: Aram Candela M.D.   On: 09/02/2020 16:16    Procedures Procedures (including critical care time)  Medications Ordered in UC Medications - No data to display  Initial Impression / Assessment and Plan / UC Course  I have reviewed the triage vital signs and the nursing notes.  Pertinent labs & imaging results that were available during my care of the patient were reviewed by me and considered in my medical decision making (see chart for details).    Benign abdominal exam Discussed baseline abdominal labs due to reported acid reflux as well, pt declined.  Hx and exam c/w constipation Will start pt on colace and miralax, prescription for anusol for suspected hemorrhoid also prescribed F/u with GI next week as needed Discussed symptoms that warrant emergent care in the ED. AVS given   Final Clinical Impressions(s) / UC Diagnoses   Final diagnoses:  Constipation, unspecified constipation type  Gastroesophageal reflux disease, unspecified whether esophagitis present     Discharge Instructions      Please take the medication as prescribed and stay well hydrated   may take 2-3 days to have a large bowel movement.  Call to schedule an appointment with a gastroenterologist next week for further evaluation and treatment of symptoms.   Call 911 or have someone drive you to the hospital if symptoms significantly worsening including worsening abdominal pain, fever, vomiting, blood in stool, or other new concerning symptoms develop.      ED Prescriptions    Medication Sig Dispense Auth. Provider   omeprazole (PRILOSEC) 20 MG capsule Take 1 capsule (20 mg total) by mouth daily. 30 capsule Doroteo Glassman, Petronella Shuford O, PA-C   hydrocortisone (ANUSOL-HC) 25 MG suppository Place 1 suppository  (25 mg total) rectally 2 (two) times daily. 12 suppository Doroteo Glassman, Airlie Blumenberg O, PA-C   docusate sodium (COLACE) 250 MG capsule Take 1 capsule (250 mg total) by mouth daily. 10 capsule Doroteo Glassman, Brison Fiumara O, PA-C   polyethylene glycol (MIRALAX / GLYCOLAX) 17 g packet Take 17 g by mouth daily. 14 each Lurene Shadow, PA-C     PDMP not reviewed this encounter.   Lurene Shadow, [place] 09/02/20 1621

## 2020-11-15 ENCOUNTER — Encounter (HOSPITAL_COMMUNITY): Payer: Self-pay | Admitting: *Deleted

## 2020-11-15 ENCOUNTER — Emergency Department (HOSPITAL_COMMUNITY)
Admission: EM | Admit: 2020-11-15 | Discharge: 2020-11-16 | Disposition: A | Discharge status: Home / Self Care | Payer: Self-pay | Attending: Emergency Medicine | Admitting: Emergency Medicine

## 2020-11-15 ENCOUNTER — Other Ambulatory Visit: Payer: Self-pay

## 2020-11-15 DIAGNOSIS — R059 Cough, unspecified: Secondary | ICD-10-CM | POA: Insufficient documentation

## 2020-11-15 DIAGNOSIS — K625 Hemorrhage of anus and rectum: Secondary | ICD-10-CM | POA: Insufficient documentation

## 2020-11-15 DIAGNOSIS — Z87891 Personal history of nicotine dependence: Secondary | ICD-10-CM | POA: Insufficient documentation

## 2020-11-15 DIAGNOSIS — K219 Gastro-esophageal reflux disease without esophagitis: Secondary | ICD-10-CM | POA: Insufficient documentation

## 2020-11-15 DIAGNOSIS — Z79899 Other long term (current) drug therapy: Secondary | ICD-10-CM | POA: Insufficient documentation

## 2020-11-15 LAB — CBC
HCT: 49 % (ref 39.0–52.0)
Hemoglobin: 16.8 g/dL (ref 13.0–17.0)
MCH: 29.9 pg (ref 26.0–34.0)
MCHC: 34.3 g/dL (ref 30.0–36.0)
MCV: 87.3 fL (ref 80.0–100.0)
Platelets: 297 10*3/uL (ref 150–400)
RBC: 5.61 MIL/uL (ref 4.22–5.81)
RDW: 12 % (ref 11.5–15.5)
WBC: 13.3 10*3/uL — ABNORMAL HIGH (ref 4.0–10.5)
nRBC: 0 % (ref 0.0–0.2)

## 2020-11-15 LAB — COMPREHENSIVE METABOLIC PANEL
ALT: 61 U/L — ABNORMAL HIGH (ref 0–44)
AST: 34 U/L (ref 15–41)
Albumin: 3.9 g/dL (ref 3.5–5.0)
Alkaline Phosphatase: 60 U/L (ref 38–126)
Anion gap: 13 (ref 5–15)
BUN: 8 mg/dL (ref 6–20)
CO2: 21 mmol/L — ABNORMAL LOW (ref 22–32)
Calcium: 9.3 mg/dL (ref 8.9–10.3)
Chloride: 103 mmol/L (ref 98–111)
Creatinine, Ser: 0.95 mg/dL (ref 0.61–1.24)
GFR, Estimated: >60 mL/min (ref >60)
Glucose, Bld: 110 mg/dL — ABNORMAL HIGH (ref 70–99)
Potassium: 3.8 mmol/L (ref 3.5–5.1)
Sodium: 137 mmol/L (ref 135–145)
Total Bilirubin: 0.6 mg/dL (ref 0.3–1.2)
Total Protein: 7.1 g/dL (ref 6.5–8.1)

## 2020-11-15 LAB — LIPASE, BLOOD: Lipase: 58 U/L — ABNORMAL HIGH (ref 11–51)

## 2020-11-15 NOTE — ED Triage Notes (Signed)
The pt is c/o rectal bleeding for 2 months he thinks he has hemorrhoids but the bright red bleeding has increased

## 2020-11-15 NOTE — ED Provider Notes (Signed)
[facility] EMERGENCY DEPARTMENT Provider Note   CSN: [id number] Arrival date & time: 11/15/20  1635     History Chief Complaint  Patient presents with  . Rectal Bleeding    Steven Richardson is a 39 y.o. male.  HPI   Pt is a 38 y/o male who presents to the ED today for eval of rectal pain and rectal bleeding. He states he has had rectal pain for the last 2 months. Pain is constant and is worse when he has a BM. He has a h/o constipation and states that this is when his sxs started. He has been on a stool softener and no longer has constipation but the pain persists. He reports asociated intermittent rectal bleeding that he states occurs about every other day for the last few months. States that sometimes there is only blood on the toilet tissue but he sometimes also has blood in the commode. States blood is bright red. Denies dark blood, clots, or melena. Denies any abdominal pain, nausea, vomiting, fevers. States the pain was worse today so he came to the ED.   Past Medical History:  Diagnosis Date  . Constipation   . Paranoid schizophrenia [facility])     Patient Active Problem List   Diagnosis Date Noted  . Schizophrenia, unspecified (HCC) 03/20/2020  . Schizophrenia, history of multiple episodes, currently acute (HCC) 03/19/2020    History reviewed. No pertinent surgical history.     Family History  Problem Relation Age of Onset  . Healthy Mother   . Healthy Father     Social History   Tobacco Use  . Smoking status: Former Games developer  . Smokeless tobacco: Never Used  Vaping Use  . Vaping Use: Never used  Substance Use Topics  . Alcohol use: No  . Drug use: No    Home Medications Prior to Admission medications   Medication Sig Start Date End Date Taking? Authorizing Provider  benzonatate (TESSALON) 100 MG capsule Take 1 capsule (100 mg total) by mouth every 8 (eight) hours for 5 days. 11/16/20 11/21/20  Ramey Schiff S, PA-C  benztropine (COGENTIN)  1 MG tablet Take 1 tablet (1 mg total) by mouth 2 (two) times daily. 03/27/20   Aldean Baker, NP  divalproex (DEPAKOTE ER) 500 MG 24 hr tablet Take 2 tablets (1,000 mg total) by mouth 2 (two) times daily. 03/27/20   Aldean Baker, NP  docusate sodium (COLACE) 250 MG capsule Take 1 capsule (250 mg total) by mouth daily. 09/02/20   Lurene Shadow, PA-C  hydrocortisone (ANUSOL-HC) 2.5 % rectal cream Place 1 application rectally 2 (two) times daily. 11/16/20   Thomasa Heidler S, PA-C  hydrocortisone (ANUSOL-HC) 25 MG suppository Place 1 suppository (25 mg total) rectally 2 (two) times daily. 09/02/20   Lurene Shadow, PA-C  lithium carbonate (LITHOBID) 300 MG CR tablet Take one tablet (300 mg) by mouth every morning and two tablets (600 mg) by mouth at bedtime. 03/27/20   Aldean Baker, NP  omeprazole (PRILOSEC) 20 MG capsule Take 1 capsule (20 mg total) by mouth daily. 11/16/20   Jeno Calleros S, PA-C  polyethylene glycol (MIRALAX / GLYCOLAX) 17 g packet Take 17 g by mouth daily. 09/02/20   Lurene Shadow, PA-C  propranolol (INDERAL) 20 MG tablet Take 1 tablet (20 mg total) by mouth 2 (two) times daily. 03/27/20   Aldean Baker, NP  QUEtiapine (SEROQUEL) 400 MG tablet Take 1 tablet (400 mg total) by mouth at  bedtime. 03/27/20   Aldean Baker, NP  QUEtiapine (SEROQUEL) 50 MG tablet Take 1 tablet (50 mg total) by mouth daily. 03/27/20   Aldean Baker, NP  risperiDONE (RISPERDAL) 2 MG tablet Take 1.5 tablets (3 mg total) by mouth at bedtime. 03/27/20   Aldean Baker, NP    Allergies    Patient has no known allergies.  Review of Systems   Review of Systems  Constitutional: Negative for fever.  HENT: Negative for ear pain and sore throat.   Eyes: Negative for visual disturbance.  Respiratory: Negative for cough and shortness of breath.   Cardiovascular: Negative for chest pain.  Gastrointestinal: Positive for blood in stool and rectal pain. Negative for abdominal pain, constipation, diarrhea, nausea  and vomiting.  Genitourinary: Negative for dysuria and hematuria.  Musculoskeletal: Negative for back pain.  Skin: Negative for rash.  Neurological: Negative for headaches.  All other systems reviewed and are negative.   Physical Exam Updated Vital Signs BP 130/90 (BP Location: Right Arm)   Pulse 97   Temp 98 F (36.7 C) (Oral)   Resp 16   Ht 5\' 11"  (1.803 m)   Wt (!) 143.3 kg   SpO2 98%   BMI 44.07 kg/m   Physical Exam Vitals and nursing note reviewed.  Constitutional:      Appearance: He is well-developed and well-nourished.  HENT:     Head: Normocephalic and atraumatic.  Eyes:     Conjunctiva/sclera: Conjunctivae normal.  Cardiovascular:     Rate and Rhythm: Normal rate and regular rhythm.     Heart sounds: Normal heart sounds. No murmur heard.   Pulmonary:     Effort: Pulmonary effort is normal. No respiratory distress.     Breath sounds: Normal breath sounds. No wheezing, rhonchi or rales.  Abdominal:     General: Bowel sounds are normal.     Palpations: Abdomen is soft.     Tenderness: There is no abdominal tenderness. There is no guarding or rebound.  Genitourinary:    Comments: DRE was performed. No obvious external hemorrhoids. No swelling, erythema, fluctuance or induration. There are some small areas of broken skin with scant bleeding consistent with anal fissures.  Musculoskeletal:        General: No edema.     Cervical back: Neck supple.  Skin:    General: Skin is warm and dry.  Neurological:     Mental Status: He is alert.  Psychiatric:        Mood and Affect: Mood and affect normal.     ED Results / Procedures / Treatments   Labs (all labs ordered are listed, but only abnormal results are displayed) Labs Reviewed  LIPASE, BLOOD - Abnormal; Notable for the following components:      Result Value   Lipase 58 (*)    All other components within normal limits  COMPREHENSIVE METABOLIC PANEL - Abnormal; Notable for the following components:    CO2 21 (*)    Glucose, Bld 110 (*)    ALT 61 (*)    All other components within normal limits  CBC - Abnormal; Notable for the following components:   WBC 13.3 (*)    All other components within normal limits  POC OCCULT BLOOD, ED - Abnormal; Notable for the following components:   Fecal Occult Bld POSITIVE (*)    All other components within normal limits  URINALYSIS, ROUTINE W REFLEX MICROSCOPIC  POC OCCULT BLOOD, ED    EKG None  Radiology No results found.  Procedures Procedures (including critical care time)  Medications Ordered in ED Medications - No data to display  ED Course  I have reviewed the triage vital signs and the nursing notes.  Pertinent labs & imaging results that were available during my care of the patient were reviewed by me and considered in my medical decision making (see chart for details).    MDM Rules/Calculators/A&P                          39 year old male presenting to the emergency department today for evaluation of rectal pain.  Pain worse with BMs.  Has had some associated bleeding.  Started after he was constipated.  He has been using MiraLAX at home and constipation has improved.  Abdomen is soft and nontender.  No systemic symptoms.  Labs did today show a mild leukocytosis, he has mild elevation of his AST, otherwise CMP is reassuring.  Lipase is marginally elevated.  His fecal occult is positive on exam this is likely due to bleeding from anal fissures.  No melena noted on exam.  He did not have any anemia on his labs.  I doubt complicated GI bleed.  We will treat him with hydrocortisone and advised on sitz bath's for his anal fissures.  I also recommended continuation of his MiraLAX and advised to follow-up with GI, referral given.  Additionally he is requesting a medication refill for omeprazole for his GERD and some cough medication for a chronic cough that he has at night.  Advise using a humidifier at night but did give Rx for Tessalon.   Lungs were clear to auscultation.  Low suspicion for pneumonia or other infectious process. Have advised to f/u and return if worse. He voices understanding of the plan and reasons to return. All questions answered, pt stable for discharge.    Final Clinical Impression(s) / ED Diagnoses Final diagnoses:  Rectal bleeding  Gastroesophageal reflux disease, unspecified whether esophagitis present  Cough    Rx / DC Orders ED Discharge Orders         Ordered    hydrocortisone (ANUSOL-HC) 2.5 % rectal cream  2 times daily        11/16/20 0053    omeprazole (PRILOSEC) 20 MG capsule  Daily        11/16/20 0053    benzonatate (TESSALON) 100 MG capsule  Every 8 hours        11/16/20 0053           Karrie Meres, PA-C 11/16/20 0054    Nira Conn, MD 11/16/20 [phone]

## 2020-11-16 ENCOUNTER — Encounter: Payer: Self-pay | Admitting: Nurse Practitioner

## 2020-11-16 LAB — POC OCCULT BLOOD, ED: Fecal Occult Bld: POSITIVE — AB

## 2020-11-16 MED ORDER — BENZONATATE 100 MG PO CAPS: 100.0000 mg | ORAL_CAPSULE | Freq: Three times a day (TID) | ORAL | 0 refills | Status: AC

## 2020-11-16 MED ORDER — HYDROCORTISONE (PERIANAL) 2.5 % EX CREA
1.0000 | TOPICAL_CREAM | Freq: Two times a day (BID) | CUTANEOUS | 0 refills | Status: DC
Start: 2020-11-16 — End: 2023-09-05

## 2020-11-16 MED ORDER — OMEPRAZOLE 20 MG PO CPDR
20.0000 mg | DELAYED_RELEASE_CAPSULE | Freq: Every day | ORAL | 0 refills | Status: DC
Start: 2020-11-16 — End: 2023-09-05

## 2020-11-16 NOTE — ED Notes (Signed)
Patient verbalizes understanding of discharge instructions. Opportunity for questioning and answers were provided. Armband removed by staff, pt discharged from ED ambulatory to home.  

## 2020-11-16 NOTE — Discharge Instructions (Addendum)
You were given a prescription to help with your rectal pain. Please use as directed. I would recommend that you complete sitz baths at least daily to help with the symptoms and continue the miralax to soften your stools.  Additionally, you were given a omeprazole for acid reflux and tessalon for a cough. I recommend that you use a humidifier at night to help with your cough.   Please follow up with the gastroenterology group that was provided for you in your discharge paperwork to further evaluate your rectal bleeding.   Additionally, Please follow up with your primary care provider within 5-7 days for re-evaluation of your symptoms. If you do not have a primary care provider, information for a healthcare clinic has been provided for you to make arrangements for follow up care. Please return to the emergency department for any new or worsening symptoms.

## 2020-12-07 ENCOUNTER — Ambulatory Visit (INDEPENDENT_AMBULATORY_CARE_PROVIDER_SITE_OTHER): Payer: Medicaid Other | Admitting: Nurse Practitioner

## 2020-12-07 ENCOUNTER — Encounter: Payer: Self-pay | Admitting: Nurse Practitioner

## 2020-12-07 DIAGNOSIS — K625 Hemorrhage of anus and rectum: Secondary | ICD-10-CM

## 2020-12-07 DIAGNOSIS — K602 Anal fissure, unspecified: Secondary | ICD-10-CM | POA: Diagnosis not present

## 2020-12-07 MED ORDER — AMBULATORY NON FORMULARY MEDICATION: 1 refills | Status: DC

## 2020-12-07 NOTE — Progress Notes (Signed)
12/07/2020 Steven Richardson [id number] 17-Jul-1981   CHIEF COMPLAINT: Rectal pain and rectal bleeding  HISTORY OF PRESENT ILLNESS: Steven Richardson with a past medical history of schizophrenia. No past surgeries.  He presents today for further evaluation regarding rectal pain and rectal bleeding.  He developed significant constipation approximately 2 months ago.  He reported passing very hard difficult bowel movements then developed rectal pain and rectal bleeding after each BM.  No obvious dietary changes or new medications associated to the timing of his constipation.  He described seeing a small amount of bright red blood on the toilet tissue and sometimes in the commode.  No upper or lower abdominal pain.  He pesented to [facility] ED on 11/15/2020 with persistent rectal bleeding with worsening rectal pain.  He was assessed to have a anal fissure and he was prescribed MiraLAX and hydrocortisone.  He was advised to schedule an appointment in our office for further GI evaluation.  He is now taking MiraLAX twice daily.  He uses Hydrocortisone cream daily and his rectal bleeding and pain have lessened.  He now sees a small amount of bright red blood on the toilet tissue once or twice weekly.  No family history of rectal cancer.  He has a chronic cough which she attributes to being a smoker.  He has heartburn for the past 6 months and when he was in the ED he was prescribed Omeprazole 20 mg daily which has significantly reduced his heartburn.  No dysphagia.  No NSAID use.  No other complaints at this time.  CBC Latest Ref Rng & Units 11/15/2020 03/27/2020 03/18/2020  WBC 4.0 - 10.5 K/uL 13.3(H) 10.3 10.9(H)  Hemoglobin 13.0 - 17.0 g/dL 16.8 16.6 17.1(H)  Hematocrit 39.0 - 52.0 % 49.0 48.4 48.5  Platelets 150 - 400 K/uL 297 268 259   CMP Latest Ref Rng & Units 11/15/2020 03/27/2020 03/18/2020  Glucose 70 - 99 mg/dL 110(H) 87 173(H)  BUN 6 - 20 mg/dL 8 14 12   Creatinine 0.61 - 1.24 mg/dL 0.95 0.90  0.77  Sodium 135 - 145 mmol/L 137 142 136  Potassium 3.5 - 5.1 mmol/L 3.8 3.6 3.9  Chloride 98 - 111 mmol/L 103 104 102  CO2 22 - 32 mmol/L 21(L) 28 21(L)  Calcium 8.9 - 10.3 mg/dL 9.3 9.4 9.2  Total Protein 6.5 - 8.1 g/dL 7.1 7.2 -  Total Bilirubin 0.3 - 1.2 mg/dL 0.6 1.5(H) -  Alkaline Phos 38 - 126 U/L 60 48 -  AST 15 - 41 U/L 34 17 -  ALT 0 - 44 U/L 61(H) 33 -    Past Medical History:  Diagnosis Date  . Constipation   . Paranoid schizophrenia [facility])    Past Surgical History:  Procedure Laterality Date  . NO PAST SURGERIES      Social History: He is married.  He has 2 sons and 2 daughters.  He smokes cigarettes 1ppd x 18 years. No alcohol. No drug use.   Family History: Mother agd 43 diabetes, high cholesterol an HTN. Father age 57 with HTN and high cholesterol. Two sister and one brother. Maternal grandmother uterine cancer.   No Known Allergies    Outpatient Encounter Medications as of 12/07/2020  Medication Sig  . AMBULATORY NON FORMULARY MEDICATION Medication Name: Diltiazem 2%/ Lidocaine 2%  Apply pea sized amount to the first knuckle and to external anal area 3 times a day for 6 weeks.  . benztropine (COGENTIN) 1 MG tablet Take 1  tablet (1 mg total) by mouth 2 (two) times daily.  . divalproex (DEPAKOTE ER) 500 MG 24 hr tablet Take 2 tablets (1,000 mg total) by mouth 2 (two) times daily.  Tery Sanfilippo Calcium (STOOL SOFTENER PO) Take 1 tablet by mouth 2 (two) times daily.  Marland Kitchen docusate sodium (COLACE) 250 MG capsule Take 1 capsule (250 mg total) by mouth daily.  . hydrocortisone (ANUSOL-HC) 2.5 % rectal cream Place 1 application rectally 2 (two) times daily.  Marland Kitchen lithium carbonate (LITHOBID) 300 MG CR tablet Take one tablet (300 mg) by mouth every morning and two tablets (600 mg) by mouth at bedtime.  Marland Kitchen omeprazole (PRILOSEC) 20 MG capsule Take 1 capsule (20 mg total) by mouth daily.  . polyethylene glycol (MIRALAX / GLYCOLAX) 17 g packet Take 17 g by mouth daily.  .  propranolol (INDERAL) 20 MG tablet Take 1 tablet (20 mg total) by mouth 2 (two) times daily.  . QUEtiapine (SEROQUEL) 400 MG tablet Take 1 tablet (400 mg total) by mouth at bedtime.  Marland Kitchen QUEtiapine (SEROQUEL) 50 MG tablet Take 1 tablet (50 mg total) by mouth daily.  . risperiDONE (RISPERDAL) 2 MG tablet Take 1.5 tablets (3 mg total) by mouth at bedtime.  . [DISCONTINUED] hydrocortisone (ANUSOL-HC) 25 MG suppository Place 1 suppository (25 mg total) rectally 2 (two) times daily. (Patient not taking: Reported on 12/07/2020)   No facility-administered encounter medications on file as of 12/07/2020.    REVIEW OF SYSTEMS:  Gen: Denies fever, sweats or chills. No weight loss.  CV: Denies chest pain, palpitations or edema. Resp: Denies cough, shortness of breath of hemoptysis.  GI: See HPI. GU : Denies urinary burning, blood in urine, increased urinary frequency or incontinence. MS: Denies joint pain, muscles aches or weakness. Derm: Denies rash, itchiness, skin lesions or unhealing ulcers. Psych: + Schizophrenia.   Heme: Denies bruising, bleeding. Neuro:  Denies headaches, dizziness or paresthesias. Endo:  Denies any problems with DM, thyroid or adrenal function.   PHYSICAL EXAM: BP 112/84 (BP Location: Left Arm, Patient Position: Sitting, Cuff Size: Large)   Pulse 82   Ht 5\' 11"  (1.803 m)   Wt (!) 322 lb 2 oz (146.1 kg)   BMI 44.93 kg/m  General: Obese 40 year old male  in no acute distress. Head: Normocephalic and atraumatic. Eyes:  Sclerae non-icteric, conjunctive pink. Ears: Normal auditory acuity. Mouth: Upper dentures. No ulcers or lesions.  Neck: Supple, no lymphadenopathy or thyromegaly.  Lungs: Clear bilaterally to auscultation without wheezes, crackles or rhonchi. Heart: Regular rate and rhythm. No murmur, rub or gallop appreciated.  Abdomen: Soft, nontender, non distended. No masses. No hepatosplenomegaly. Normoactive bowel sounds x 4 quadrants.  Rectal: Posterior fissure oozed  a small amount of bright red blood on exam, this area is tender without a palpable abscess.  Rectal exam was limited due to a tight anal sphincter associated with active pain.  No external hemorrhoids.  CMA 20 present during exam. Musculoskeletal: Symmetrical with no gross deformities. Skin: Warm and dry. No rash or lesions on visible extremities. Extremities: No edema. Neurological: Alert oriented x 4, no focal deficits.  Psychological:  Alert and cooperative. Normal mood and affect.  ASSESSMENT AND PLAN:  41.  40 year old male with rectal bleeding and rectal pain.  Rectal exam identified a posterior anal fissure.  WBC 13.3 in the ED.  No reports of having a fever. -Stop hydrocortisone cream -Diltiazem 2%/lidocaine 2% fissure ointment apply a small amount inside the anal opening into the external  anal area 3 times daily for 6 weeks -If the above fissure cream is not affordable the patient will then use Desitin diaper rash ointment mixed with RectiCare (Lidocaine5%) cream to apply a small amount inside the anal opening into the external area 3 times daily for 6 weeks. -Soak in the tub with warm water/sitz bath 3 times twice daily as tolerated -Continue new MiraLAX twice daily -Schedule a colonoscopy in 4 to 6 weeks. Colonoscopy benefits and risks discussed including risk with sedation, risk of bleeding, perforation and infection.  Patient is currently uninsured and he has applied for Medicaid.  He elects to follow-up in the office in 4 to 6 weeks and if he has medicaid at that time he will schedule a colonoscopy for further evaluation. -Patient to call our office if his symptoms worsen -Repeat CBC when patient has Medicaid  2.  Heartburn. -Continue Omeprazole 20 mg daily -Patient does not wish to pursue an EGD at this time -GERD diet discussed.  Weight loss recommended.  3.  Elevated ALT level of 61. -Repeat hepatic panel once the patient has Medicaid       CC:  No ref. provider  found

## 2020-12-07 NOTE — Patient Instructions (Signed)
If you are age 40 or older, your body mass index should be between 23-30. Your Body mass index is 44.93 kg/m. If this is out of the aforementioned range listed, please consider follow up with your Primary Care Provider.  If you are age 64 or younger, your body mass index should be between 19-25. Your Body mass index is 44.93 kg/m. If this is out of the aformentioned range listed, please consider follow up with your Primary Care Provider.     We have sent a prescription for Diltiazem 2% gel to [facility] for you. Using your index finger, you should apply a small amount of medication inside the rectum up to your first knuckle/joint twice daily x 6 weeks.  [facility]'s information is below: Address: [address] [place], [place] [address]  [phone]  *Please DO NOT go directly from our office to pick up this medication! Give the pharmacy 1 day to process the prescription as this is compounded and takes time to make.  If this is not an affordable option, please use Desitin diaper rash ointment along with the Recticare sample we have given you today.  It has been recommended to you by your physician that you have a(n) Colonoscopy completed. Per your request, we did not schedule the procedure(s) today. Please contact our office at [phone] should you decide to have the procedure completed once you have received your insurance coverage.     It was great seeing you today!  Thank you for entrusting me with your care and choosing [facility].  Alcide Evener, NP

## 2020-12-07 NOTE — Addendum Note (Signed)
Addended by: Darliss Ridgel I on: 12/07/2020 12:56 PM   Modules accepted: Orders

## 2020-12-08 NOTE — Progress Notes (Signed)
____________________________________________________________  Attending physician addendum:  Thank you for sending this case to me. I have reviewed the entire note and agree with the treatment plan..  However, he may not need a colonoscopy since you discovered an obvious posterior fissure to explain the pain and bleeding. Lidocaine ointment and miralax are essential to fissure healing, which may take 6-8 weeks since it was severe and present for months. Please be sure he has follow up scheduled with me in about 6 weeks.  Amada Jupiter, MD  ____________________________________________________________

## 2020-12-11 NOTE — Progress Notes (Signed)
LMOM for patient to call back to schedule a 6 week office visit with Dr Darcey Nora

## 2020-12-12 NOTE — Progress Notes (Signed)
Called patient 3 times and left voice mail to contact the office to schedule a 6 week follow up with Dr Myrtie Neither. Letter for appointment has been mailed out.

## 2021-01-25 ENCOUNTER — Ambulatory Visit: Payer: Self-pay | Admitting: Gastroenterology

## 2021-01-30 ENCOUNTER — Encounter (HOSPITAL_BASED_OUTPATIENT_CLINIC_OR_DEPARTMENT_OTHER): Payer: Self-pay | Admitting: Emergency Medicine

## 2021-01-30 ENCOUNTER — Other Ambulatory Visit: Payer: Self-pay

## 2021-01-30 ENCOUNTER — Emergency Department (HOSPITAL_BASED_OUTPATIENT_CLINIC_OR_DEPARTMENT_OTHER)
Admission: EM | Admit: 2021-01-30 | Discharge: 2021-01-30 | Disposition: A | Discharge status: Home / Self Care | Payer: Medicaid Other | Attending: Emergency Medicine | Admitting: Emergency Medicine

## 2021-01-30 DIAGNOSIS — F200 Paranoid schizophrenia: Secondary | ICD-10-CM | POA: Diagnosis not present

## 2021-01-30 DIAGNOSIS — Z760 Encounter for issue of repeat prescription: Secondary | ICD-10-CM | POA: Insufficient documentation

## 2021-01-30 DIAGNOSIS — F17210 Nicotine dependence, cigarettes, uncomplicated: Secondary | ICD-10-CM | POA: Insufficient documentation

## 2021-01-30 MED ORDER — QUETIAPINE FUMARATE 400 MG PO TABS: 400.0000 mg | ORAL_TABLET | Freq: Every day | ORAL | 0 refills | Status: DC

## 2021-01-30 NOTE — Discharge Instructions (Signed)
You were seen in the emerge department today for medication refill.  I provided a 2-week refill of your medication to allow time for you to call your prescribing physician.  They will need to call an additional medicines to the pharmacy or make changes to your dosing as needed.   Outpatient Psychiatry and Counseling  [facility]: Mobile Crisis Management 24 hours:  [phone]  [facility] of the [employer] sliding scale fee and walk in schedule: M-F 8am-12pm/1pm-3pm [address]  [place], [place] [address] [phone]  [facility] [address] [place], [place] [address] [phone]  [facility] (Formerly known as [employer])- new patient walk-in appointments available Monday - Friday 8am -3pm.          [address] [place], [place] [address] [phone] or crisis line- [phone]  [facility] Outpatient Services/ Intensive Outpatient Therapy Program [address] [place], [place] [address] [phone]  [facility]                  Crisis Services      [phone] [address]     [place], [place] [address]                 [facility]   [facility] [phone]. [address] [place], [place] [address]   [facility]          [address]  [place], [place] [address]       [phone]  [facility] [address] [place], [place] [address] [phone]  [facility]    [address]    [place], [place] [address]     [phone]       Andee Poles, MD     [address]     [place] [address]     [phone]       [facility] [address] [place] [address]  [facility]     [address] [place], [place]      [phone]       [facility] Eulogio Ditch, MD [address] [place], [place] [address] [phone]  [facility]     [address]     [place], [place] [address]     [phone]       [facility] [address] [place], [place] [address] [phone] Resources for Temporary Residential Assistance/Crisis Centers  DAY CENT[facility]) M-F 8am-3pm   [address] [place], [place] [address]   [phone] Services include: laundry, barbering, support groups, case management, phone  & computer access, showers, AA/NA mtgs, mental health/substance abuse nurse, job skills class, disability information, VA assistance, spiritual classes, etc.   HOMELESS SHELTERS  [facility]     [facility]   [address] [place]     [phone]              [employer] (women and children)       [address] [place], [place] [address] [phone] [email or web address] for application and process Application Required  [facility] [employer] [facility]   [address]    [place] [address]     [phone]  [place], [place] [address] [phone](JOINOMVE application appt.) Application Required  [facility] (women only)    [address]     [place], [place] [address]     [phone]      Intake starts 6pm daily Need valid ID, SSC, & Police report [employer] [address] [place], [place] [phone] Application Required  [employer] (men only)     [place]      [place], [place]       [place] (Pregnant women only) [address] [place], [place]  [facility]      [place] [address]      [place], [place] [address]     [phone]             [facility] [address] [place], [place] 90 day  commitment/SA/Application process  [facility](men only)     [address]     [place], [place]       Check-in at [facility] of [facility] [address] [place], [place] [address] [phone] Men/Women/Women and Children must be there by 7 pm  [place], [place]

## 2021-01-30 NOTE — ED Provider Notes (Signed)
Emergency Department Provider Note   I have reviewed the triage vital signs and the nursing notes.   HISTORY  Chief Complaint Medication Refill   HPI Steven Richardson is a 40 y.o. male presents to the ED requesting a Seroquel refill. He has been on this medication consistently until the last several days. He denies any worsening depression, SI, or HI. He does have refills of other medications. He called the office with no answer and so came to the ED. No radiation of symptoms or modifying factors.   Past Medical History:  Diagnosis Date  . Constipation   . Paranoid schizophrenia [facility])     Patient Active Problem List   Diagnosis Date Noted  . Rectal bleeding 12/07/2020  . Anal fissure 12/07/2020  . Schizophrenia, unspecified (HCC) 03/20/2020  . Schizophrenia, history of multiple episodes, currently acute (HCC) 03/19/2020    Past Surgical History:  Procedure Laterality Date  . NO PAST SURGERIES      Allergies Patient has no known allergies.  Family History  Problem Relation Age of Onset  . Healthy Mother   . Diabetes Mother   . Healthy Father   . Colon cancer Neg Hx   . Esophageal cancer Neg Hx   . Stomach cancer Neg Hx   . Pancreatic cancer Neg Hx     Social History Social History   Tobacco Use  . Smoking status: Current Every Day Smoker    Types: Cigarettes  . Smokeless tobacco: Never Used  Vaping Use  . Vaping Use: Never used  Substance Use Topics  . Alcohol use: No  . Drug use: No    Review of Systems  Constitutional: No fever/chills Eyes: No visual changes. ENT: No sore throat. Cardiovascular: Denies chest pain. Respiratory: Denies shortness of breath. Gastrointestinal: No abdominal pain.  No nausea, no vomiting.  No diarrhea.  No constipation. Genitourinary: Negative for dysuria. Musculoskeletal: Negative for back pain. Skin: Negative for rash. Neurological: Negative for headaches, focal weakness or numbness.  10-point ROS otherwise  negative.  ____________________________________________   PHYSICAL EXAM:  VITAL SIGNS: ED Triage Vitals  Enc Vitals Group     BP 01/30/21 1656 (!) 147/104     Pulse Rate 01/30/21 1656 98     Resp 01/30/21 1656 20     Temp 01/30/21 1656 98.5 F (36.9 C)     Temp Source 01/30/21 1656 Oral     SpO2 01/30/21 1656 100 %     Weight 01/30/21 1658 (!) 322 lb 1.5 oz (146.1 kg)     Height 01/30/21 1658 5\' 11"  (1.803 m)   Constitutional: Alert and oriented. Well appearing and in no acute distress. Eyes: Conjunctivae are normal.  Head: Atraumatic. Nose: No congestion/rhinnorhea. Mouth/Throat: Mucous membranes are moist.  Neck: No stridor.  Cardiovascular:  Good peripheral circulation.  Respiratory: Normal respiratory effort.  Gastrointestinal: No distention.  Musculoskeletal: No gross deformities of extremities. Neurologic:  Normal speech and language. Skin:  Skin is warm, dry and intact. No rash noted. Psychiatric: Mood and affect are normal. Speech and behavior are normal.  ____________________________________________   PROCEDURES  Procedure(s) performed:   Procedures  None ____________________________________________   INITIAL IMPRESSION / ASSESSMENT AND PLAN / ED COURSE  Pertinent labs & imaging results that were available during my care of the patient were reviewed by me and considered in my medical decision making (see chart for details).   Patient presents to the ED requesting Seroquel refill. Agreed to provide a short refill with plan to  call the prescribing MD this week for additional refills. No symptoms or findings to prompt additional medication or psych evaluate in the ED setting.    ____________________________________________  FINAL CLINICAL IMPRESSION(S) / ED DIAGNOSES  Final diagnoses:  Medication refill    Note:  This document was prepared using Dragon voice recognition software and may include unintentional dictation errors.  Alona Bene, MD,  [facility] Emergency Medicine    Murvin Gift, Arlyss Repress, MD 02/08/21 [phone]

## 2021-01-30 NOTE — ED Triage Notes (Signed)
Reports when his provider refilled his script for seroquel he only gave him a thirty day supply for once daily and he takes it twice a day.  Has been out for 4 days.

## 2021-05-29 IMAGING — DX DG ABDOMEN 1V
3 series · 3 of 3 positions shown · non-contrast
Comparison: None.

CLINICAL DATA: Constipation for 2 weeks.

EXAM:
ABDOMEN - 1 VIEW

[abdomen kub (1 of 3)]
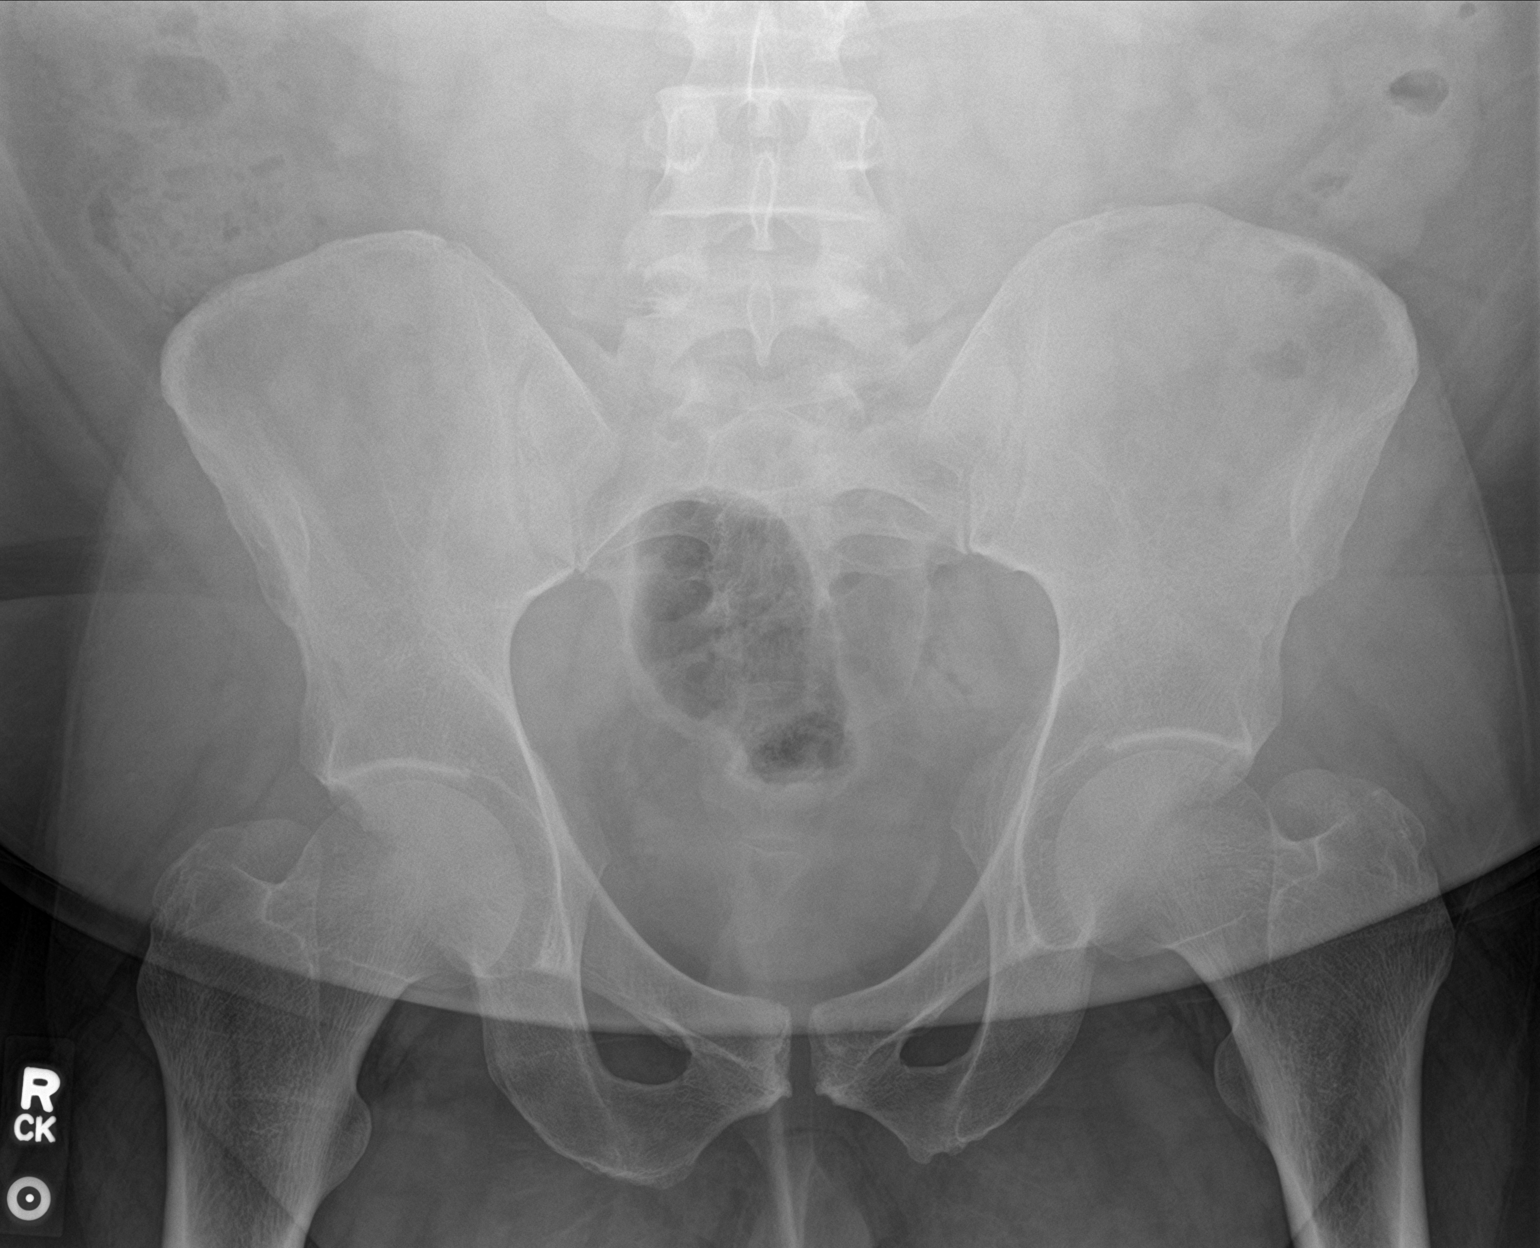

[abdomen kub (2 of 3)]
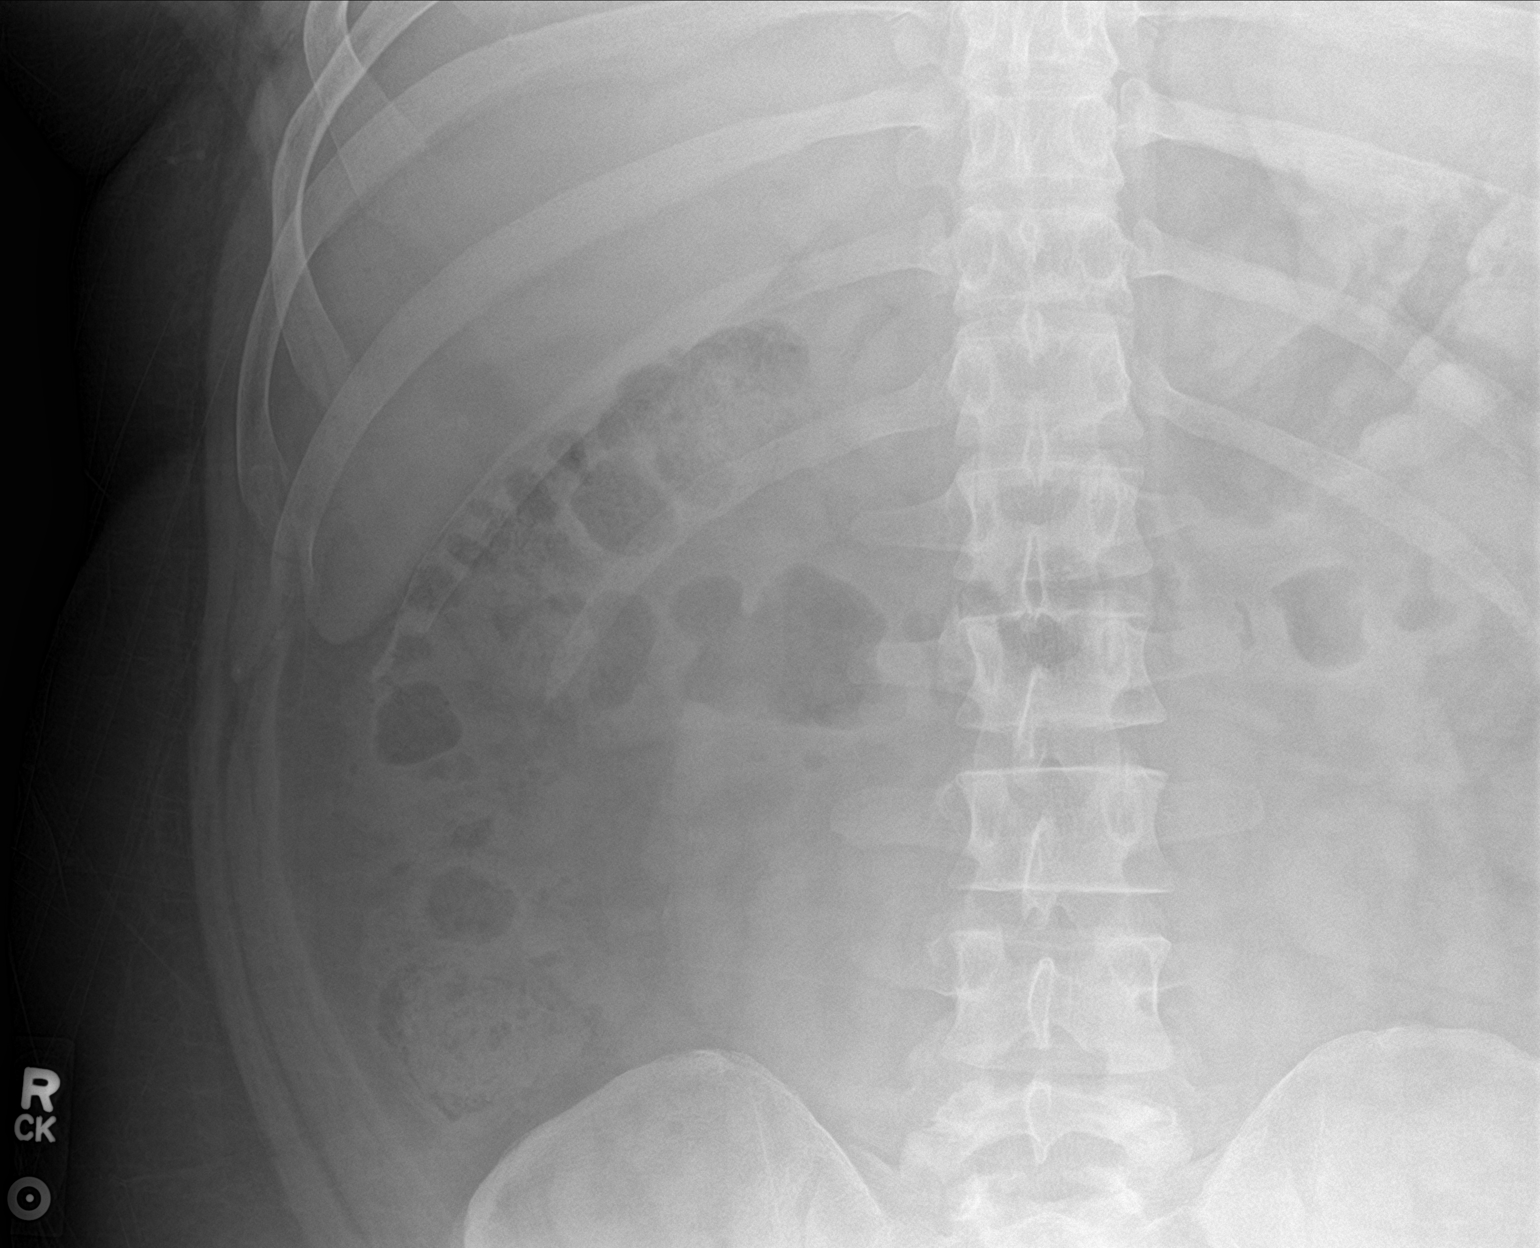

[abdomen kub (3 of 3)]
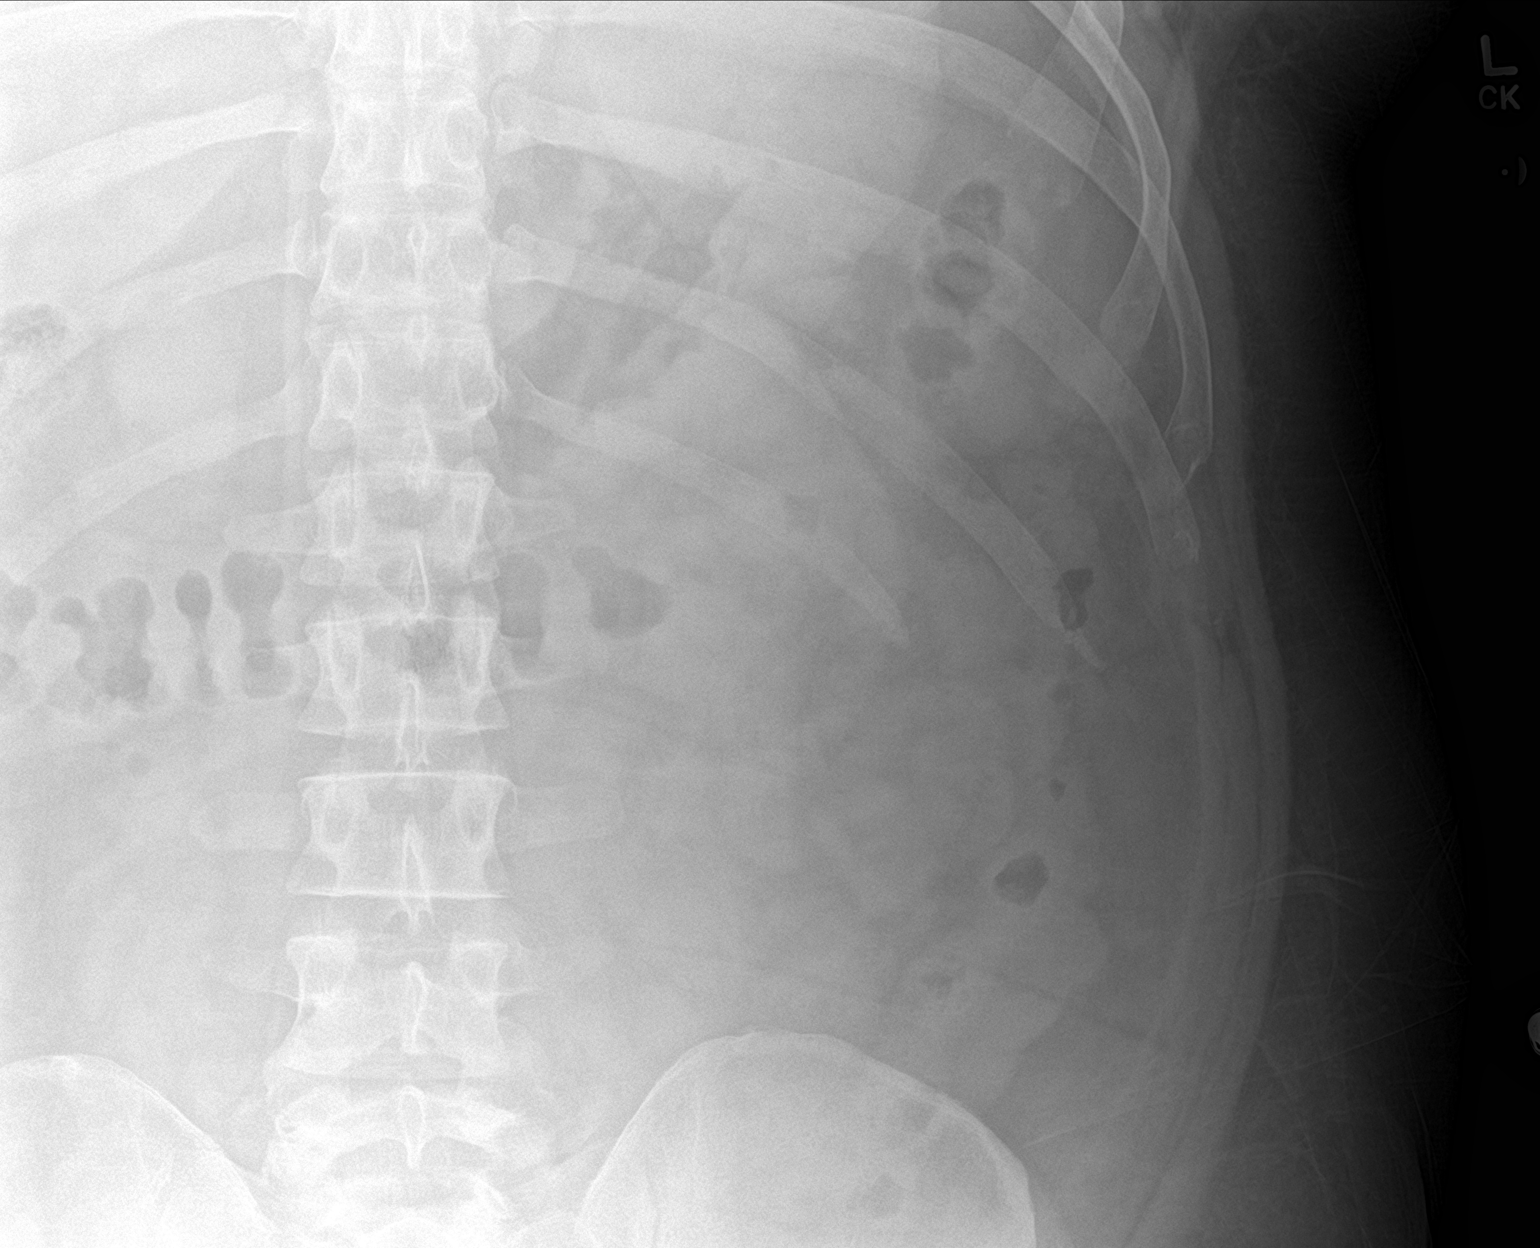

[3 of 3 positions shown; findings below may reference images not displayed]

FINDINGS: The bowel gas pattern is normal. A mild-to-moderate amount of stool
is seen within the ascending colon. No radio-opaque calculi or other
significant radiographic abnormality are seen.
IMPRESSION: Negative.

## 2021-08-06 ENCOUNTER — Other Ambulatory Visit: Payer: Self-pay

## 2021-08-06 ENCOUNTER — Emergency Department (HOSPITAL_COMMUNITY)
Admission: EM | Admit: 2021-08-06 | Discharge: 2021-08-06 | Disposition: A | Discharge status: Home / Self Care | Payer: Medicare Other | Attending: Emergency Medicine | Admitting: Emergency Medicine

## 2021-08-06 DIAGNOSIS — L02416 Cutaneous abscess of left lower limb: Secondary | ICD-10-CM | POA: Diagnosis not present

## 2021-08-06 DIAGNOSIS — Z79899 Other long term (current) drug therapy: Secondary | ICD-10-CM | POA: Insufficient documentation

## 2021-08-06 DIAGNOSIS — F17210 Nicotine dependence, cigarettes, uncomplicated: Secondary | ICD-10-CM | POA: Diagnosis not present

## 2021-08-06 DIAGNOSIS — M79652 Pain in left thigh: Secondary | ICD-10-CM | POA: Diagnosis present

## 2021-08-06 MED ORDER — LIDOCAINE-EPINEPHRINE (PF) 2 %-1:200000 IJ SOLN
20.0000 mL | Freq: Once | INTRAMUSCULAR | Status: AC
  Administered 2021-08-06: 20 mL via INTRADERMAL
  Filled 2021-08-06: qty 20

## 2021-08-06 MED ORDER — SULFAMETHOXAZOLE-TRIMETHOPRIM 800-160 MG PO TABS: 1.0000 | ORAL_TABLET | Freq: Two times a day (BID) | ORAL | 0 refills | Status: AC

## 2021-08-06 MED ORDER — SULFAMETHOXAZOLE-TRIMETHOPRIM 800-160 MG PO TABS
1.0000 | ORAL_TABLET | Freq: Once | ORAL | Status: AC
  Administered 2021-08-06: 1 via ORAL
  Filled 2021-08-06: qty 1

## 2021-08-06 NOTE — ED Triage Notes (Signed)
Pt reports left inner thigh pain for the past 7 months. Pt denies injury. Pt states it feels like a ball is inside of his leg.

## 2021-08-06 NOTE — ED Provider Notes (Signed)
Emergency Medicine Provider Triage Evaluation Note  Steven Richardson , a 40 y.o. male  was evaluated in triage.  Pt complains of Pain in left thigh. This has been ongoing with swelling for months but got worse 4 days ago.  He denies any fevers or drainage  Review of Systems  Positive: Left leg lump, swelling Negative: Fevers  Physical Exam  BP (!) 143/99 (BP Location: Left Arm)   Pulse (!) 113   Temp 98.4 F (36.9 C) (Oral)   Resp 18   Ht 5\' 11"  (1.803 m)   Wt (!) 160.1 kg   SpO2 94%   BMI 49.23 kg/m  Gen:   Awake, no distress   Resp:  Normal effort  MSK:   Moves extremities without difficulty  Other:  Unable to clearly visualize the area due to triage setting.  He is ambulatory without difficulty.  He is tachycardic here however is not febrile.  There does appear to be multiple areas of swelling on the left proximal thigh  Medical Decision Making  Medically screening exam initiated at 7:41 PM.  Appropriate orders placed.  Steven Richardson was informed that the remainder of the evaluation will be completed by another provider, this initial triage assessment does not replace that evaluation, and the importance of remaining in the ED until their evaluation is complete.  Note: Portions of this report may have been transcribed using voice recognition software. Every effort was made to ensure accuracy; however, inadvertent computerized transcription errors may be present    08/06/21 1944    10/06/21, MD 08/07/21 2124

## 2021-08-06 NOTE — ED Provider Notes (Signed)
[facility] EMERGENCY DEPARTMENT Provider Note   CSN: [id number] Arrival date & time: 08/06/21  1845     History Chief Complaint  Patient presents with   Leg Pain    Steven Richardson is a 40 y.o. male.  HPI 40 year old male presents with left thigh pain.  He states has been ongoing for months where he had a small lump but over the last 3 days or so it has grown in size and is more painful, especially with any type of movement.  No fevers or systemic symptoms.  No drainage.  Anytime it rubs such as walking it makes it worse.  He has not tried anything for it.  Past Medical History:  Diagnosis Date   Constipation    Paranoid schizophrenia [facility])     Patient Active Problem List   Diagnosis Date Noted   Rectal bleeding 12/07/2020   Anal fissure 12/07/2020   Schizophrenia, unspecified (HCC) 03/20/2020   Schizophrenia, history of multiple episodes, currently acute (HCC) 03/19/2020    Past Surgical History:  Procedure Laterality Date   NO PAST SURGERIES         Family History  Problem Relation Age of Onset   Healthy Mother    Diabetes Mother    Healthy Father    Colon cancer Neg Hx    Esophageal cancer Neg Hx    Stomach cancer Neg Hx    Pancreatic cancer Neg Hx     Social History   Tobacco Use   Smoking status: Every Day    Types: Cigarettes   Smokeless tobacco: Never  Vaping Use   Vaping Use: Never used  Substance Use Topics   Alcohol use: No   Drug use: No    Home Medications Prior to Admission medications   Medication Sig Start Date End Date Taking? Authorizing Provider  sulfamethoxazole-trimethoprim (BACTRIM DS) 800-160 MG tablet Take 1 tablet by mouth 2 (two) times daily for 5 days. 08/06/21 08/11/21 Yes Pricilla Loveless, MD  AMBULATORY NON FORMULARY MEDICATION Medication Name: Diltiazem 2%/ Lidocaine 2%  Apply pea sized amount to the first knuckle and to external anal area 3 times a day for 6 weeks. 12/07/20   Arnaldo Natal, NP   benztropine (COGENTIN) 1 MG tablet Take 1 tablet (1 mg total) by mouth 2 (two) times daily. 03/27/20   Aldean Baker, NP  divalproex (DEPAKOTE ER) 500 MG 24 hr tablet Take 2 tablets (1,000 mg total) by mouth 2 (two) times daily. 03/27/20   Aldean Baker, NP  Docusate Calcium (STOOL SOFTENER PO) Take 1 tablet by mouth 2 (two) times daily.    [provider]  docusate sodium (COLACE) 250 MG capsule Take 1 capsule (250 mg total) by mouth daily. 09/02/20   Lurene Shadow, PA-C  hydrocortisone (ANUSOL-HC) 2.5 % rectal cream Place 1 application rectally 2 (two) times daily. 11/16/20   Couture, Cortni S, PA-C  lithium carbonate (LITHOBID) 300 MG CR tablet Take one tablet (300 mg) by mouth every morning and two tablets (600 mg) by mouth at bedtime. 03/27/20   Aldean Baker, NP  omeprazole (PRILOSEC) 20 MG capsule Take 1 capsule (20 mg total) by mouth daily. 11/16/20   Couture, Cortni S, PA-C  polyethylene glycol (MIRALAX / GLYCOLAX) 17 g packet Take 17 g by mouth daily. 09/02/20   Lurene Shadow, PA-C  propranolol (INDERAL) 20 MG tablet Take 1 tablet (20 mg total) by mouth 2 (two) times daily. 03/27/20   Marciano Sequin  E, NP  QUEtiapine (SEROQUEL) 400 MG tablet Take 1 tablet (400 mg total) by mouth at bedtime for 14 days. 01/30/21 02/13/21  Long, Arlyss Repress, MD  QUEtiapine (SEROQUEL) 50 MG tablet Take 1 tablet (50 mg total) by mouth daily. 03/27/20   Aldean Baker, NP  risperiDONE (RISPERDAL) 2 MG tablet Take 1.5 tablets (3 mg total) by mouth at bedtime. 03/27/20   Aldean Baker, NP    Allergies    Patient has no known allergies.  Review of Systems   Review of Systems  Constitutional:  Negative for fever.  Musculoskeletal:  Positive for myalgias.   Physical Exam Updated Vital Signs BP 128/87 (BP Location: Left Arm)   Pulse 90   Temp 98.6 F (37 C) (Oral)   Resp 16   Ht 5\' 11"  (1.803 m)   Wt (!) 160.1 kg   SpO2 94%   BMI 49.23 kg/m   Physical Exam Vitals and nursing note reviewed.   Constitutional:      Appearance: He is well-developed. He is obese.  HENT:     Head: Normocephalic and atraumatic.     Right Ear: External ear normal.     Left Ear: External ear normal.     Nose: Nose normal.  Eyes:     General:        Right eye: No discharge.        Left eye: No discharge.  Cardiovascular:     Rate and Rhythm: Normal rate and regular rhythm.  Pulmonary:     Effort: Pulmonary effort is normal.  Abdominal:     General: There is no distension.  Musculoskeletal:     Cervical back: Neck supple.  Skin:    General: Skin is warm and dry.     Comments: In the left proximal medial thigh there is a small raised lesion c/w subcutaneous abscess. Mild warmth  Neurological:     Mental Status: He is alert.  Psychiatric:        Mood and Affect: Mood is not anxious.    ED Results / Procedures / Treatments   Labs (all labs ordered are listed, but only abnormal results are displayed) Labs Reviewed - No data to display  EKG None  Radiology No results found.  Procedures Ultrasound ED Soft Tissue  Date/Time: 08/06/2021 8:40 PM Performed by: 10/06/2021, MD Authorized by: Pricilla Loveless, MD   Procedure details:    Indications: localization of abscess     Transverse view:  Visualized   Longitudinal view:  Visualized   Images: archived   Location:    Location: lower extremity     Side:  Left Findings:     abscess present    cellulitis present .Pricilla LovelessIncision and Drainage  Date/Time: 08/06/2021 10:46 PM Performed by: 10/06/2021, MD Authorized by: Pricilla Loveless, MD   Consent:    Consent obtained:  Verbal   Consent given by:  Patient   Risks discussed:  Bleeding, infection, incomplete drainage and pain Universal protocol:    Patient identity confirmed:  Verbally with patient Location:    Type:  Abscess   Size:  3 cm   Location:  Lower extremity   Lower extremity location:  Leg   Leg location:  L upper leg Pre-procedure details:    Skin  preparation:  Povidone-iodine Sedation:    Sedation type:  None Anesthesia:    Anesthesia method:  Local infiltration   Local anesthetic:  Lidocaine 2% WITH epi Procedure type:  Complexity:  Simple Procedure details:    Incision types:  Elliptical   Incision depth:  Dermal   Wound management:  Probed and deloculated   Drainage:  Bloody and purulent   Drainage amount:  Moderate   Wound treatment:  Wound left open   Packing materials:  None Post-procedure details:    Procedure completion:  Tolerated well, no immediate complications   Medications Ordered in ED Medications  lidocaine-EPINEPHrine (XYLOCAINE W/EPI) 2 %-1:200000 (PF) injection 20 mL (20 mLs Intradermal Given by Other 08/06/21 2053)  sulfamethoxazole-trimethoprim (BACTRIM DS) 800-160 MG per tablet 1 tablet (1 tablet Oral Given 08/06/21 2301)    ED Course  I have reviewed the triage vital signs and the nursing notes.  Pertinent labs & imaging results that were available during my care of the patient were reviewed by me and considered in my medical decision making (see chart for details).    MDM Rules/Calculators/A&P                           Unclear why he has had discomfort/swelling for months but the acute worsening is likely acute abscess with mild cellulitis.  Given the size I think it be reasonable to put on antibiotics after the incision and drainage.  He tolerated this well.  Highly doubt deep space infection and I could see the end of the abscess wall on the ultrasound.  Will discharge home with return precautions.  Unclear why he is tachycardic besides discomfort as his vitals have now normalized. Final Clinical Impression(s) / ED Diagnoses Final diagnoses:  Abscess of left thigh    Rx / DC Orders ED Discharge Orders          Ordered    sulfamethoxazole-trimethoprim (BACTRIM DS) 800-160 MG tablet  2 times daily        08/06/21 2243             Pricilla Loveless, MD 08/06/21 2316

## 2021-08-18 ENCOUNTER — Encounter (HOSPITAL_BASED_OUTPATIENT_CLINIC_OR_DEPARTMENT_OTHER): Payer: Self-pay | Admitting: Emergency Medicine

## 2021-08-18 ENCOUNTER — Emergency Department (HOSPITAL_BASED_OUTPATIENT_CLINIC_OR_DEPARTMENT_OTHER)
Admission: EM | Admit: 2021-08-18 | Discharge: 2021-08-18 | Disposition: A | Discharge status: Home / Self Care | Payer: Medicare Other | Attending: Emergency Medicine | Admitting: Emergency Medicine

## 2021-08-18 ENCOUNTER — Other Ambulatory Visit: Payer: Self-pay

## 2021-08-18 DIAGNOSIS — L02416 Cutaneous abscess of left lower limb: Secondary | ICD-10-CM | POA: Diagnosis not present

## 2021-08-18 DIAGNOSIS — Z5321 Procedure and treatment not carried out due to patient leaving prior to being seen by health care provider: Secondary | ICD-10-CM | POA: Insufficient documentation

## 2021-08-18 NOTE — ED Notes (Signed)
Registration reports pt went to car ~ 10 minutes ago and has not returned

## 2021-08-18 NOTE — ED Triage Notes (Signed)
Pt reports abscess to LT thigh; sts it is right above another abscess that was I&D'd last week

## 2021-12-02 ENCOUNTER — Emergency Department (HOSPITAL_BASED_OUTPATIENT_CLINIC_OR_DEPARTMENT_OTHER)
Admission: EM | Admit: 2021-12-02 | Discharge: 2021-12-03 | Disposition: A | Discharge status: Home / Self Care | Payer: Medicare Other | Attending: Emergency Medicine | Admitting: Emergency Medicine

## 2021-12-02 ENCOUNTER — Other Ambulatory Visit: Payer: Self-pay

## 2021-12-02 ENCOUNTER — Encounter (HOSPITAL_BASED_OUTPATIENT_CLINIC_OR_DEPARTMENT_OTHER): Payer: Self-pay | Admitting: Urology

## 2021-12-02 DIAGNOSIS — D72829 Elevated white blood cell count, unspecified: Secondary | ICD-10-CM | POA: Diagnosis not present

## 2021-12-02 DIAGNOSIS — Z20822 Contact with and (suspected) exposure to covid-19: Secondary | ICD-10-CM | POA: Diagnosis not present

## 2021-12-02 DIAGNOSIS — R0981 Nasal congestion: Secondary | ICD-10-CM | POA: Insufficient documentation

## 2021-12-02 DIAGNOSIS — R350 Frequency of micturition: Secondary | ICD-10-CM | POA: Insufficient documentation

## 2021-12-02 DIAGNOSIS — R051 Acute cough: Secondary | ICD-10-CM

## 2021-12-02 DIAGNOSIS — R067 Sneezing: Secondary | ICD-10-CM | POA: Diagnosis not present

## 2021-12-02 DIAGNOSIS — R739 Hyperglycemia, unspecified: Secondary | ICD-10-CM | POA: Diagnosis present

## 2021-12-02 DIAGNOSIS — L02416 Cutaneous abscess of left lower limb: Secondary | ICD-10-CM | POA: Diagnosis not present

## 2021-12-02 DIAGNOSIS — E1165 Type 2 diabetes mellitus with hyperglycemia: Secondary | ICD-10-CM | POA: Insufficient documentation

## 2021-12-02 DIAGNOSIS — R21 Rash and other nonspecific skin eruption: Secondary | ICD-10-CM | POA: Diagnosis present

## 2021-12-02 HISTORY — DX: Type 2 diabetes mellitus without complications: E11.9

## 2021-12-02 LAB — CBC WITH DIFFERENTIAL/PLATELET
Abs Immature Granulocytes: 0.37 10*3/uL — ABNORMAL HIGH (ref 0.00–0.07)
Basophils Absolute: 0.1 10*3/uL (ref 0.0–0.1)
Basophils Relative: 1 %
Eosinophils Absolute: 0.2 10*3/uL (ref 0.0–0.5)
Eosinophils Relative: 1 %
HCT: 45.5 % (ref 39.0–52.0)
Hemoglobin: 16.7 g/dL (ref 13.0–17.0)
Immature Granulocytes: 3 %
Lymphocytes Relative: 39 %
Lymphs Abs: 5.4 10*3/uL — ABNORMAL HIGH (ref 0.7–4.0)
MCH: 30.3 pg (ref 26.0–34.0)
MCHC: 36.7 g/dL — ABNORMAL HIGH (ref 30.0–36.0)
MCV: 82.4 fL (ref 80.0–100.0)
Monocytes Absolute: 1.1 10*3/uL — ABNORMAL HIGH (ref 0.1–1.0)
Monocytes Relative: 8 %
Neutro Abs: 6.7 10*3/uL (ref 1.7–7.7)
Neutrophils Relative %: 48 %
Platelets: 312 10*3/uL (ref 150–400)
RBC: 5.52 MIL/uL (ref 4.22–5.81)
RDW: 13.2 % (ref 11.5–15.5)
WBC: 13.9 10*3/uL — ABNORMAL HIGH (ref 4.0–10.5)
nRBC: 0.4 % — ABNORMAL HIGH (ref 0.0–0.2)

## 2021-12-02 LAB — I-STAT VENOUS BLOOD GAS, ED
Acid-Base Excess: 3 mmol/L — ABNORMAL HIGH (ref 0.0–2.0)
Bicarbonate: 26.4 mmol/L (ref 20.0–28.0)
Calcium, Ion: 1.14 mmol/L — ABNORMAL LOW (ref 1.15–1.40)
HCT: 48 % (ref 39.0–52.0)
Hemoglobin: 16.3 g/dL (ref 13.0–17.0)
O2 Saturation: 98 %
Patient temperature: 98
Potassium: 4.5 mmol/L (ref 3.5–5.1)
Sodium: 125 mmol/L — ABNORMAL LOW (ref 135–145)
TCO2: 27 mmol/L (ref 22–32)
pCO2, Ven: 34.6 mmHg — ABNORMAL LOW (ref 44.0–60.0)
pH, Ven: 7.49 — ABNORMAL HIGH (ref 7.250–7.430)
pO2, Ven: 87 mmHg — ABNORMAL HIGH (ref 32.0–45.0)

## 2021-12-02 LAB — RESP PANEL BY RT-PCR (FLU A&B, COVID) ARPGX2
Influenza A by PCR: NEGATIVE
Influenza B by PCR: NEGATIVE
SARS Coronavirus 2 by RT PCR: NEGATIVE

## 2021-12-02 LAB — HEPATIC FUNCTION PANEL
ALT: 21 U/L (ref 0–44)
AST: 20 U/L (ref 15–41)
Albumin: 4 g/dL (ref 3.5–5.0)
Alkaline Phosphatase: 102 U/L (ref 38–126)
Bilirubin, Direct: 0.2 mg/dL (ref 0.0–0.2)
Indirect Bilirubin: 1.3 mg/dL — ABNORMAL HIGH (ref 0.3–0.9)
Total Bilirubin: 1.5 mg/dL — ABNORMAL HIGH (ref 0.3–1.2)
Total Protein: 8.2 g/dL — ABNORMAL HIGH (ref 6.5–8.1)

## 2021-12-02 LAB — BASIC METABOLIC PANEL
Anion gap: 17 — ABNORMAL HIGH (ref 5–15)
BUN: 10 mg/dL (ref 6–20)
CO2: 22 mmol/L (ref 22–32)
Calcium: 9.6 mg/dL (ref 8.9–10.3)
Chloride: 85 mmol/L — ABNORMAL LOW (ref 98–111)
Creatinine, Ser: 0.83 mg/dL (ref 0.61–1.24)
GFR, Estimated: >60 mL/min (ref >60)
Glucose, Bld: 586 mg/dL (ref 70–99)
Potassium: 4.4 mmol/L (ref 3.5–5.1)
Sodium: 124 mmol/L — ABNORMAL LOW (ref 135–145)

## 2021-12-02 LAB — CBG MONITORING, ED
Glucose-Capillary: 498 mg/dL — ABNORMAL HIGH (ref 70–99)
Glucose-Capillary: >600 mg/dL (ref 70–99)

## 2021-12-02 MED ORDER — INSULIN ASPART 100 UNIT/ML IJ SOLN
10.0000 [IU] | Freq: Once | INTRAMUSCULAR | Status: AC
  Administered 2021-12-03: 10 [IU] via SUBCUTANEOUS

## 2021-12-02 MED ORDER — ALBUTEROL SULFATE HFA 108 (90 BASE) MCG/ACT IN AERS
1.0000 | INHALATION_SPRAY | Freq: Once | RESPIRATORY_TRACT | Status: AC
  Administered 2021-12-02: 1 via RESPIRATORY_TRACT
  Filled 2021-12-02: qty 6.7

## 2021-12-02 MED ORDER — CETIRIZINE HCL 10 MG PO TABS: 10.0000 mg | ORAL_TABLET | Freq: Every day | ORAL | 0 refills | Status: DC

## 2021-12-02 MED ORDER — CEPHALEXIN 500 MG PO CAPS: 500.0000 mg | ORAL_CAPSULE | Freq: Three times a day (TID) | ORAL | 0 refills | Status: DC

## 2021-12-02 MED ORDER — LIVING WELL WITH DIABETES BOOK
Freq: Once | Status: AC
  Administered 2021-12-03: 1
  Filled 2021-12-02: qty 1

## 2021-12-02 MED ORDER — DOXYCYCLINE HYCLATE 100 MG PO TABS
100.0000 mg | ORAL_TABLET | Freq: Once | ORAL | Status: AC
  Administered 2021-12-03: 100 mg via ORAL
  Filled 2021-12-02: qty 1

## 2021-12-02 MED ORDER — FLUTICASONE PROPIONATE 50 MCG/ACT NA SUSP: 2.0000 | Freq: Every day | NASAL | 0 refills | Status: DC

## 2021-12-02 MED ORDER — DOXYCYCLINE HYCLATE 100 MG PO CAPS: 100.0000 mg | ORAL_CAPSULE | Freq: Two times a day (BID) | ORAL | 0 refills | Status: DC

## 2021-12-02 MED ORDER — SODIUM CHLORIDE 0.9 % IV BOLUS
1000.0000 mL | Freq: Once | INTRAVENOUS | Status: AC
  Administered 2021-12-03: 1000 mL via INTRAVENOUS

## 2021-12-02 MED ORDER — SODIUM CHLORIDE 0.9 % IV BOLUS
1000.0000 mL | Freq: Once | INTRAVENOUS | Status: AC
  Administered 2021-12-02: 1000 mL via INTRAVENOUS

## 2021-12-02 NOTE — ED Triage Notes (Signed)
"  Black dot" rash to chest ( on areola) that started 3 days ago Reports itching  States has asthma and is out of inhaler PCP appointment 1/26

## 2021-12-02 NOTE — Discharge Instructions (Addendum)
It was our pleasure to provide your ER care today - we hope that you feel better.  Please take the antibiotics as prescribed you for the abscess on your left upper leg.  Keep area very clean. Follow up with primary care doctor or urgent care in two days time for recheck.  I prescribed you Zyrtec and Flonase to take for your cough congestion and you may use the albuterol inhaler that I sent you home with.  As we discussed I do not think this will significantly help your symptoms given that you are not wheezing however since you seem to have has any improvement with your symptoms in the past with albuterol inhaler I have no issue sending home with this medication.  For high sugars, drink plenty of water/fluids, take diabetes meds as prescribed, follow diabetes eating plan, check glucose 4x/day and record values, and follow up closely with your doctor in the next 2-3 days.   Return to ER if worse, new symptoms, high fevers, new/severe pain, spreading redness, trouble breathing, persistent vomiting, or other concern.

## 2021-12-02 NOTE — ED Provider Notes (Signed)
[facility] EMERGENCY DEPARTMENT Provider Note   CSN: [id number] Arrival date & time: 12/02/21  2044     History  Chief Complaint  Patient presents with   Rash    Steven Richardson is a 41 y.o. male.   Rash Associated symptoms: fatigue   Associated symptoms: no abdominal pain, no fever, no headaches, no myalgias, no shortness of breath and not vomiting    Patient is a 41 year old gentleman with past medical history significant for schizophrenia, constipation, DM2  Patient is here in the emergency room today with multiple complaints states that he has had some cough and congestion for the past 3 to 4 days sneezing as well.  States that he would like a refill of an albuterol inhaler that he has had in the past which is helped with his symptoms of cough and congestion.  He has not been diagnosed with asthma or COPD is not a smoker.  He denies any wheezing.  He also complains of bilateral areolar rash that he describes as black dots that started 3 days ago he states it is slightly itchy.  Denies any purulence or nipple discharge.  He is also describing urinary frequency denies any burning with urination denies any abdominal pain nausea vomiting chest pain difficulty breathing.  Denies any fevers, difficulty eating or drinking.  Denies any numbness or weakness in any extremity.  No slurred speech or confusion.  He also shows me a small area on his left thigh that is uncomfortable and red and swollen he states it has been like this for approximately a week    Home Medications Prior to Admission medications   Medication Sig Start Date End Date Taking? Authorizing Provider  doxycycline (VIBRAMYCIN) 100 MG capsule Take 1 capsule (100 mg total) by mouth 2 (two) times daily. 12/02/21  Yes Belanna Manring S, PA  AMBULATORY NON FORMULARY MEDICATION Medication Name: Diltiazem 2%/ Lidocaine 2%  Apply pea sized amount to the first knuckle and to external anal area 3 times a day for 6 weeks.  12/07/20   Arnaldo NatalKennedy-Smith, Colleen M, NP  benztropine (COGENTIN) 1 MG tablet Take 1 tablet (1 mg total) by mouth 2 (two) times daily. 03/27/20   Aldean BakerSykes, Janet E, NP  cetirizine (ZYRTEC ALLERGY) 10 MG tablet Take 1 tablet (10 mg total) by mouth daily. 12/02/21   Gailen ShelterFondaw, Niquita Digioia S, PA  divalproex (DEPAKOTE ER) 500 MG 24 hr tablet Take 2 tablets (1,000 mg total) by mouth 2 (two) times daily. 03/27/20   Aldean BakerSykes, Janet E, NP  Docusate Calcium (STOOL SOFTENER PO) Take 1 tablet by mouth 2 (two) times daily.    [provider]  docusate sodium (COLACE) 250 MG capsule Take 1 capsule (250 mg total) by mouth daily. 09/02/20   Lurene ShadowPhelps, Erin O, PA-C  fluticasone (FLONASE) 50 MCG/ACT nasal spray Place 2 sprays into both nostrils daily for 14 days. 12/02/21 12/16/21  Gailen ShelterFondaw, Tallie Hevia S, PA  hydrocortisone (ANUSOL-HC) 2.5 % rectal cream Place 1 application rectally 2 (two) times daily. 11/16/20   Couture, Cortni S, PA-C  lithium carbonate (LITHOBID) 300 MG CR tablet Take one tablet (300 mg) by mouth every morning and two tablets (600 mg) by mouth at bedtime. 03/27/20   Aldean BakerSykes, Janet E, NP  omeprazole (PRILOSEC) 20 MG capsule Take 1 capsule (20 mg total) by mouth daily. 11/16/20   Couture, Cortni S, PA-C  polyethylene glycol (MIRALAX / GLYCOLAX) 17 g packet Take 17 g by mouth daily. 09/02/20   Lurene ShadowPhelps, Erin O, PA-C  propranolol (INDERAL) 20 MG tablet Take 1 tablet (20 mg total) by mouth 2 (two) times daily. 03/27/20   Aldean Baker, NP  QUEtiapine (SEROQUEL) 400 MG tablet Take 1 tablet (400 mg total) by mouth at bedtime for 14 days. 01/30/21 02/13/21  Long, Arlyss Repress, MD  QUEtiapine (SEROQUEL) 50 MG tablet Take 1 tablet (50 mg total) by mouth daily. 03/27/20   Aldean Baker, NP  risperiDONE (RISPERDAL) 2 MG tablet Take 1.5 tablets (3 mg total) by mouth at bedtime. 03/27/20   Aldean Baker, NP      Allergies    Patient has no known allergies.    Review of Systems   Review of Systems  Constitutional:  Positive for fatigue.  Negative for chills and fever.  HENT:  Positive for congestion.   Eyes:  Negative for pain.  Respiratory:  Positive for cough. Negative for shortness of breath.   Cardiovascular:  Negative for chest pain and leg swelling.  Gastrointestinal:  Negative for abdominal pain and vomiting.  Genitourinary:  Positive for frequency. Negative for dysuria.  Musculoskeletal:  Negative for myalgias.  Skin:  Positive for rash.  Neurological:  Negative for dizziness and headaches.   Physical Exam Updated Vital Signs BP 107/75    Pulse 91    Temp 98 F (36.7 C) (Oral)    Resp 17    Ht 5\' 11"  (1.803 m)    Wt (!) 161.9 kg    SpO2 97%    BMI 49.78 kg/m  Physical Exam Vitals and nursing note reviewed.  Constitutional:      General: He is not in acute distress.    Comments: Morbidly obese 41 year old gentleman appears chronically ill.  In no acute distress.  HENT:     Head: Normocephalic and atraumatic.     Nose: Nose normal.     Mouth/Throat:     Mouth: Mucous membranes are dry.  Eyes:     General: No scleral icterus. Cardiovascular:     Rate and Rhythm: Normal rate and regular rhythm.     Pulses: Normal pulses.     Heart sounds: Normal heart sounds.  Pulmonary:     Effort: Pulmonary effort is normal. No respiratory distress.     Breath sounds: No wheezing.  Abdominal:     Palpations: Abdomen is soft.     Tenderness: There is no abdominal tenderness.  Musculoskeletal:     Cervical back: Normal range of motion.     Right lower leg: No edema.     Left lower leg: No edema.  Skin:    General: Skin is warm and dry.     Capillary Refill: Capillary refill takes less than 2 seconds.     Comments: Approximately 4 x 2 cm abscess in the left anterior thigh Fluctuant.  Mild surrounding cellulitis  Neurological:     Mental Status: He is alert. Mental status is at baseline.  Psychiatric:        Mood and Affect: Mood normal.        Behavior: Behavior normal.    ED Results / Procedures / Treatments    Labs (all labs ordered are listed, but only abnormal results are displayed) Labs Reviewed  CBC WITH DIFFERENTIAL/PLATELET - Abnormal; Notable for the following components:      Result Value   WBC 13.9 (*)    MCHC 36.7 (*)    nRBC 0.4 (*)    Lymphs Abs 5.4 (*)    Monocytes Absolute 1.1 (*)  Abs Immature Granulocytes 0.37 (*)    All other components within normal limits  BASIC METABOLIC PANEL - Abnormal; Notable for the following components:   Sodium 124 (*)    Chloride 85 (*)    Glucose, Bld 586 (*)    Anion gap 17 (*)    All other components within normal limits  HEPATIC FUNCTION PANEL - Abnormal; Notable for the following components:   Total Protein 8.2 (*)    Total Bilirubin 1.5 (*)    Indirect Bilirubin 1.3 (*)    All other components within normal limits  CBG MONITORING, ED - Abnormal; Notable for the following components:   Glucose-Capillary >600 (*)    All other components within normal limits  I-STAT VENOUS BLOOD GAS, ED - Abnormal; Notable for the following components:   pH, Ven 7.490 (*)    pCO2, Ven 34.6 (*)    pO2, Ven 87.0 (*)    Acid-Base Excess 3.0 (*)    Sodium 125 (*)    Calcium, Ion 1.14 (*)    All other components within normal limits  CBG MONITORING, ED - Abnormal; Notable for the following components:   Glucose-Capillary 498 (*)    All other components within normal limits  RESP PANEL BY RT-PCR (FLU A&B, COVID) ARPGX2  URINALYSIS, ROUTINE W REFLEX MICROSCOPIC    EKG None  Radiology No results found.  Procedures .Marland KitchenIncision and Drainage  Date/Time: 12/03/2021 12:08 AM Performed by: Gailen Shelter, PA Authorized by: Gailen Shelter, PA   Consent:    Consent obtained:  Verbal   Consent given by:  Patient   Risks discussed:  Bleeding, incomplete drainage, pain and damage to other organs   Alternatives discussed:  No treatment Universal protocol:    Procedure explained and questions answered to patient or proxy's satisfaction: yes      Relevant documents present and verified: yes     Test results available : yes     Imaging studies available: yes     Required blood products, implants, devices, and special equipment available: yes     Site/side marked: yes     Immediately prior to procedure, a time out was called: yes     Patient identity confirmed:  Verbally with patient Location:    Type:  Abscess   Size:  4 x 2 cm   Location:  Lower extremity   Lower extremity location: Left thigh. Pre-procedure details:    Skin preparation:  Betadine Anesthesia:    Anesthesia method:  None Procedure type:    Complexity:  Simple Procedure details:    Incision type: Aspiration with 18-gauge needle.   Drainage:  Purulent   Drainage amount:  Moderate (3 cc) Post-procedure details:    Procedure completion:  Tolerated well, no immediate complications   Medications Ordered in ED Medications  living well with diabetes book MISC (has no administration in time range)  sodium chloride 0.9 % bolus 1,000 mL (has no administration in time range)  insulin aspart (novoLOG) injection 10 Units (has no administration in time range)  doxycycline (VIBRA-TABS) tablet 100 mg (has no administration in time range)  albuterol (VENTOLIN HFA) 108 (90 Base) MCG/ACT inhaler 1 puff (1 puff Inhalation Given 12/02/21 2208)  sodium chloride 0.9 % bolus 1,000 mL ( Intravenous Stopped 12/02/21 2349)    ED Course/ Medical Decision Making/ A&P Clinical Course as of 12/03/21 0009  Sun Dec 02, 2021  2311 Sodium(!): 124 Sodium corrects to 132-136 depending on which calculation. Chloride somewhat low  will provide with normal saline.  Blood sugar elevated at 76 mild anion gap some dehydration.  I-STAT VBG without any acidosis.  She did not mildly alkalotic.  Normal bicarb.   CBC with mildly elevated consistent with cellulitic rash patient has.  LFTs without abnormality.  COVID influenza negative.  Flu [WF]  2342 Discussed with patient's wife who states that he is  taking his medications as prescribed. [WF]    Clinical Course User Index [WF] Gailen ShelterFondaw, Aster Eckrich S, [place]                           Medical Decision Making Patient is a 41 year old gentleman with diabetic hyperglycemia today multiple complaints seems that he is having symptoms of a viral illness but also is hyperglycemic with polyuria polydipsia.  Blood sugar elevated at greater than 600 initially provided IV fluids is downtrending now 500 we will provide him additional bolus and subcu insulin.  VBG shows no acidosis normal bicarb.  Hyponatremia corrects to normal.  Mild anion gap.  Will hydrate provided insulin we will treat patient's abscess with aspiration and doxycycline which she will be discharged home with.  CBC with mild leukocytosis No anemia  COVID was negative.  Anticipate discharge home after fluids and insulin.  Strict return precautions given and will need to follow-up closely with PCP.  He is understanding of plan.  Patient care handed off to oncoming team 12:11 AM   Final Clinical Impression(s) / ED Diagnoses Final diagnoses:  Acute cough  Abscess of left thigh    Rx / DC Orders ED Discharge Orders          Ordered    cephALEXin (KEFLEX) 500 MG capsule  3 times daily,   Status:  Discontinued        12/02/21 2200    cetirizine (ZYRTEC ALLERGY) 10 MG tablet  Daily,   Status:  Discontinued        12/02/21 2200    fluticasone (FLONASE) 50 MCG/ACT nasal spray  Daily,   Status:  Discontinued        12/02/21 2200    doxycycline (VIBRAMYCIN) 100 MG capsule  2 times daily        12/02/21 2351    cetirizine (ZYRTEC ALLERGY) 10 MG tablet  Daily        12/02/21 2352    fluticasone (FLONASE) 50 MCG/ACT nasal spray  Daily        12/02/21 2352              Solon AugustaFondaw, Jemario Poitras John[place]S, [place] 12/03/21 0012    Gloris Manchesterixon, Ryan, MD 12/05/21 [phone]

## 2021-12-03 DIAGNOSIS — R739 Hyperglycemia, unspecified: Secondary | ICD-10-CM | POA: Diagnosis present

## 2021-12-03 DIAGNOSIS — L02416 Cutaneous abscess of left lower limb: Secondary | ICD-10-CM | POA: Diagnosis not present

## 2021-12-03 LAB — BASIC METABOLIC PANEL
Anion gap: 15 (ref 5–15)
Anion gap: 13 (ref 5–15)
Anion gap: 12 (ref 5–15)
Anion gap: 12 (ref 5–15)
BUN: 9 mg/dL (ref 6–20)
BUN: 7 mg/dL (ref 6–20)
BUN: 7 mg/dL (ref 6–20)
BUN: 7 mg/dL (ref 6–20)
CO2: 22 mmol/L (ref 22–32)
CO2: 22 mmol/L (ref 22–32)
CO2: 23 mmol/L (ref 22–32)
CO2: 22 mmol/L (ref 22–32)
Calcium: 8.7 mg/dL — ABNORMAL LOW (ref 8.9–10.3)
Calcium: 8.7 mg/dL — ABNORMAL LOW (ref 8.9–10.3)
Calcium: 8.5 mg/dL — ABNORMAL LOW (ref 8.9–10.3)
Calcium: 8.6 mg/dL — ABNORMAL LOW (ref 8.9–10.3)
Chloride: 92 mmol/L — ABNORMAL LOW (ref 98–111)
Chloride: 95 mmol/L — ABNORMAL LOW (ref 98–111)
Chloride: 98 mmol/L (ref 98–111)
Chloride: 100 mmol/L (ref 98–111)
Creatinine, Ser: 0.7 mg/dL (ref 0.61–1.24)
Creatinine, Ser: 0.6 mg/dL — ABNORMAL LOW (ref 0.61–1.24)
Creatinine, Ser: 0.5 mg/dL — ABNORMAL LOW (ref 0.61–1.24)
Creatinine, Ser: 0.48 mg/dL — ABNORMAL LOW (ref 0.61–1.24)
GFR, Estimated: >60 mL/min (ref >60)
GFR, Estimated: >60 mL/min (ref >60)
GFR, Estimated: >60 mL/min (ref >60)
GFR, Estimated: >60 mL/min (ref >60)
Glucose, Bld: 410 mg/dL — ABNORMAL HIGH (ref 70–99)
Glucose, Bld: 289 mg/dL — ABNORMAL HIGH (ref 70–99)
Glucose, Bld: 193 mg/dL — ABNORMAL HIGH (ref 70–99)
Glucose, Bld: 184 mg/dL — ABNORMAL HIGH (ref 70–99)
Potassium: 3.8 mmol/L (ref 3.5–5.1)
Potassium: 3.8 mmol/L (ref 3.5–5.1)
Potassium: 3.1 mmol/L — ABNORMAL LOW (ref 3.5–5.1)
Potassium: 3.9 mmol/L (ref 3.5–5.1)
Sodium: 129 mmol/L — ABNORMAL LOW (ref 135–145)
Sodium: 130 mmol/L — ABNORMAL LOW (ref 135–145)
Sodium: 133 mmol/L — ABNORMAL LOW (ref 135–145)
Sodium: 134 mmol/L — ABNORMAL LOW (ref 135–145)

## 2021-12-03 LAB — CBG MONITORING, ED
Glucose-Capillary: 336 mg/dL — ABNORMAL HIGH (ref 70–99)
Glucose-Capillary: 273 mg/dL — ABNORMAL HIGH (ref 70–99)
Glucose-Capillary: 220 mg/dL — ABNORMAL HIGH (ref 70–99)
Glucose-Capillary: 245 mg/dL — ABNORMAL HIGH (ref 70–99)
Glucose-Capillary: 219 mg/dL — ABNORMAL HIGH (ref 70–99)
Glucose-Capillary: 177 mg/dL — ABNORMAL HIGH (ref 70–99)
Glucose-Capillary: 181 mg/dL — ABNORMAL HIGH (ref 70–99)
Glucose-Capillary: 191 mg/dL — ABNORMAL HIGH (ref 70–99)
Glucose-Capillary: 182 mg/dL — ABNORMAL HIGH (ref 70–99)
Glucose-Capillary: 202 mg/dL — ABNORMAL HIGH (ref 70–99)
Glucose-Capillary: 192 mg/dL — ABNORMAL HIGH (ref 70–99)

## 2021-12-03 LAB — URINALYSIS, ROUTINE W REFLEX MICROSCOPIC
Bilirubin Urine: NEGATIVE
Glucose, UA: >=500 mg/dL — AB
Ketones, ur: 80 mg/dL — AB
Leukocytes,Ua: NEGATIVE
Nitrite: NEGATIVE
Protein, ur: NEGATIVE mg/dL
Specific Gravity, Urine: 1.01 (ref 1.005–1.030)
pH: 5.5 (ref 5.0–8.0)

## 2021-12-03 LAB — URINALYSIS, MICROSCOPIC (REFLEX)

## 2021-12-03 LAB — LACTIC ACID, PLASMA: Lactic Acid, Venous: 0.9 mmol/L (ref 0.5–1.9)

## 2021-12-03 LAB — BETA-HYDROXYBUTYRIC ACID
Beta-Hydroxybutyric Acid: 2.23 mmol/L — ABNORMAL HIGH (ref 0.05–0.27)
Beta-Hydroxybutyric Acid: 0.82 mmol/L — ABNORMAL HIGH (ref 0.05–0.27)

## 2021-12-03 LAB — VALPROIC ACID LEVEL: Valproic Acid Lvl: 35 ug/mL — ABNORMAL LOW (ref 50.0–100.0)

## 2021-12-03 LAB — OSMOLALITY: Osmolality: 293 mOsm/kg (ref 275–295)

## 2021-12-03 LAB — LITHIUM LEVEL: Lithium Lvl: 0.19 mmol/L — ABNORMAL LOW (ref 0.60–1.20)

## 2021-12-03 MED ORDER — LACTATED RINGERS IV BOLUS
20.0000 mL/kg | Freq: Once | INTRAVENOUS | Status: DC
Start: 2021-12-03 — End: 2021-12-03

## 2021-12-03 MED ORDER — SODIUM CHLORIDE 0.9 % IV BOLUS
1000.0000 mL | Freq: Once | INTRAVENOUS | Status: AC
  Administered 2021-12-03: 1000 mL via INTRAVENOUS

## 2021-12-03 MED ORDER — DEXTROSE 50 % IV SOLN: 0.0000 mL | INTRAVENOUS | Status: DC | PRN

## 2021-12-03 MED ORDER — DOXYCYCLINE HYCLATE 100 MG PO CAPS: 100.0000 mg | ORAL_CAPSULE | Freq: Two times a day (BID) | ORAL | 0 refills | Status: DC

## 2021-12-03 MED ORDER — POTASSIUM CHLORIDE 10 MEQ/100ML IV SOLN
10.0000 meq | INTRAVENOUS | Status: AC
  Administered 2021-12-03 (×3): 10 meq via INTRAVENOUS
  Filled 2021-12-03 (×3): qty 100

## 2021-12-03 MED ORDER — RISPERIDONE 0.5 MG PO TABS: 1.5000 mg | ORAL_TABLET | Freq: Every day | ORAL | Status: DC

## 2021-12-03 MED ORDER — BENZTROPINE MESYLATE 1 MG PO TABS: 1.0000 mg | ORAL_TABLET | Freq: Two times a day (BID) | ORAL | Status: DC

## 2021-12-03 MED ORDER — QUETIAPINE FUMARATE 50 MG PO TABS: 450.0000 mg | ORAL_TABLET | Freq: Every day | ORAL | Status: DC

## 2021-12-03 MED ORDER — INSULIN REGULAR(HUMAN) IN NACL 100-0.9 UT/100ML-% IV SOLN
INTRAVENOUS | Status: DC
  Administered 2021-12-03: 8 [IU]/h via INTRAVENOUS
  Administered 2021-12-03: 16 [IU]/h via INTRAVENOUS
  Filled 2021-12-03 (×2): qty 100

## 2021-12-03 MED ORDER — DIVALPROEX SODIUM ER 250 MG PO TB24
1000.0000 mg | ORAL_TABLET | Freq: Two times a day (BID) | ORAL | Status: DC
  Administered 2021-12-03: 1000 mg via ORAL
  Filled 2021-12-03: qty 4

## 2021-12-03 MED ORDER — LACTATED RINGERS IV SOLN: INTRAVENOUS | Status: DC

## 2021-12-03 MED ORDER — RISPERIDONE 0.5 MG PO TABS: 3.0000 mg | ORAL_TABLET | Freq: Every day | ORAL | Status: DC

## 2021-12-03 MED ORDER — QUETIAPINE FUMARATE 50 MG PO TABS: 50.0000 mg | ORAL_TABLET | Freq: Every day | ORAL | Status: DC

## 2021-12-03 MED ORDER — POTASSIUM CHLORIDE CRYS ER 20 MEQ PO TBCR
40.0000 meq | EXTENDED_RELEASE_TABLET | Freq: Once | ORAL | Status: AC
  Administered 2021-12-03: 40 meq via ORAL
  Filled 2021-12-03: qty 2

## 2021-12-03 MED ORDER — LITHIUM CARBONATE ER 300 MG PO TBCR: 300.0000 mg | EXTENDED_RELEASE_TABLET | Freq: Two times a day (BID) | ORAL | Status: DC

## 2021-12-03 MED ORDER — DEXTROSE IN LACTATED RINGERS 5 % IV SOLN: INTRAVENOUS | Status: DC

## 2021-12-03 NOTE — ED Notes (Signed)
Wife of patient informed me that she gave the patient his dose of lithium and cogentin.

## 2021-12-03 NOTE — ED Notes (Signed)
Patient has drank 3 glasses of water with no c/o n/v.

## 2021-12-03 NOTE — Progress Notes (Signed)
Inpatient Diabetes Program Recommendations  AACE/ADA: New Consensus Statement on Inpatient Glycemic Control (2015)  Target Ranges:  Prepandial:   less than 140 mg/dL      Peak postprandial:   less than 180 mg/dL (1-2 hours)      Critically ill patients:  140 - 180 mg/dL    Latest Reference Range & Units 12/02/21 22:03 12/02/21 23:39 12/03/21 02:36 12/03/21 04:56 12/03/21 05:43 12/03/21 06:09 12/03/21 07:17  Glucose-Capillary 70 - 99 mg/dL >233 (HH) 007 (H) 622 (H) 273 (H) 245 (H) 220 (H) 219 (H)     Admit Hyperglycemia (Glucose 586 [AG 17/ CO2 level 22]/ Thigh Abscess   Seen at PCP office Oct 2022--New diagnosis DM made (told to take Metformin 500 mg QPM)--Seen again Nov 2022 and A1c was up to 10.7%--Told to Increase Metformin to 500 BID   Home: Metformin 500 mg BID   Given IVF in the ED + 10 units Novolog X 1 dose.  Started IV Insulin drip at 5am today.    Being transferred to [facility]     MD- Note 12:32 BMET shows the following: Glucose 184 mg/dl Anion Gap 12 CO2 level 22  When you allow pt to transition to SQ Insulin, please consider:  Semglee 24 units Daily (0.15 units/kg based on weight of 161 kg) Please make sure to give Semglee at least 2 hours prior to d/c of the IV Insulin Drip  Start Novolog Moderate (0-15 units) TID AC + HS     --Will follow patient during hospitalization--  Ambrose Finland RN, MSN, CDE Diabetes Coordinator Inpatient Glycemic Control Team Team Pager: [phone] (8a-5p)

## 2021-12-03 NOTE — ED Provider Notes (Signed)
Care assumed from [facility].  Patient with URI symptoms and hyperglycemia and urinary frequency.  Anion gap is 17 with bicarb of 21 and hyponatremia to 125 that corrects to normal.  Anion gap has improved with IV fluids and insulin. However beta hydroxybutyrate is elevated  Anion gap is improved.  Has persistent large ketones in the urine as well as elevated beta hydroxybutyrate.  Concern for hyperosmotic hyperglycemia syndrome.  Will initiate insulin drip and plan for admission.  D/w Dr. Arlean Hopping.  .Critical Care Performed by: Glynn Octave, MD Authorized by: Glynn Octave, MD   Critical care provider statement:    Critical care time (minutes):  35   Critical care time was exclusive of:  Separately billable procedures and treating other patients   Critical care was necessary to treat or prevent imminent or life-threatening deterioration of the following conditions:  Endocrine crisis, metabolic crisis and dehydration   Critical care was time spent personally by me on the following activities:  Development of treatment plan with patient or surrogate, discussions with consultants, evaluation of patient's response to treatment, examination of patient, ordering and review of laboratory studies, ordering and review of radiographic studies, ordering and performing treatments and interventions, pulse oximetry, re-evaluation of patient's condition and review of old charts   I assumed direction of critical care for this patient from another provider in my specialty: yes     Care discussed with: admitting provider        Glynn Octave, MD 12/03/21 [phone]

## 2021-12-03 NOTE — ED Notes (Signed)
Called [facility] to cancel Bed Request per Doctor.

## 2021-12-03 NOTE — Progress Notes (Signed)
Patient: Steven Richardson, Steven Richardson  DOB: 03-Oct-1981 FUX:[id number] Transferring facility: [facility] Requesting provider: Dr Manus Gunning (EDP at [facility]) Reason for transfer: admission for further evaluation and management of HH NK versus DKA.  41 year old male with poorly controlled/recently diagnosed type 2 diabetes mellitus, on metformin alone as an outpatient, who presents to [facility] emergency department complaining of 3 to 4 days of polyuria.  Noted to have elevated blood sugar, anion gap metabolic acidosis, elevated beta hydroxybutyric acid.  Started on IV fluids, insulin drip, Endo tool.  Additionally, noted to have abscess on thigh, status post aspiration, and for which he was started on antibiotics in the ED. COVID-19/influenza negative.   Subsequently, I accepted this patient for transfer for observation to a pcu bed at [facility] for further work-up and management of HH NK versus DKA.     Newton Pigg, DO Hospitalist

## 2021-12-03 NOTE — ED Provider Notes (Signed)
Patient had been up for admission, but states feels improved, has eaten/drank, no nv, feels well and ready for d/c.   Repeat labs/bmet repeated,  hco3 normal, k normal, no gap.   Vitals normal.   Pt currently appears stable for d/c.   Rec pcp f/u.  Return precautions provided.      Cathren Laine, MD 12/03/21 1423

## 2023-08-14 ENCOUNTER — Encounter (HOSPITAL_COMMUNITY): Payer: Self-pay

## 2023-08-14 ENCOUNTER — Emergency Department (HOSPITAL_COMMUNITY)
Admission: EM | Admit: 2023-08-14 | Discharge: 2023-08-14 | Disposition: A | Discharge status: Home / Self Care | Payer: Medicare HMO | Attending: Emergency Medicine | Admitting: Emergency Medicine

## 2023-08-14 ENCOUNTER — Other Ambulatory Visit: Payer: Self-pay

## 2023-08-14 ENCOUNTER — Emergency Department (HOSPITAL_BASED_OUTPATIENT_CLINIC_OR_DEPARTMENT_OTHER)
Admit: 2023-08-14 | Discharge: 2023-08-14 | Disposition: A | Discharge status: Home / Self Care | Payer: Medicare HMO | Attending: Emergency Medicine | Admitting: Emergency Medicine

## 2023-08-14 DIAGNOSIS — M7989 Other specified soft tissue disorders: Secondary | ICD-10-CM

## 2023-08-14 DIAGNOSIS — L03115 Cellulitis of right lower limb: Secondary | ICD-10-CM | POA: Insufficient documentation

## 2023-08-14 DIAGNOSIS — Z79899 Other long term (current) drug therapy: Secondary | ICD-10-CM | POA: Insufficient documentation

## 2023-08-14 DIAGNOSIS — R2241 Localized swelling, mass and lump, right lower limb: Secondary | ICD-10-CM | POA: Diagnosis present

## 2023-08-14 DIAGNOSIS — R600 Localized edema: Secondary | ICD-10-CM | POA: Diagnosis present

## 2023-08-14 LAB — BASIC METABOLIC PANEL
Anion gap: 10 (ref 5–15)
BUN: 9 mg/dL (ref 6–20)
CO2: 26 mmol/L (ref 22–32)
Calcium: 9.2 mg/dL (ref 8.9–10.3)
Chloride: 102 mmol/L (ref 98–111)
Creatinine, Ser: 0.72 mg/dL (ref 0.61–1.24)
GFR, Estimated: >60 mL/min (ref >60)
Glucose, Bld: 113 mg/dL — ABNORMAL HIGH (ref 70–99)
Potassium: 3.3 mmol/L — ABNORMAL LOW (ref 3.5–5.1)
Sodium: 138 mmol/L (ref 135–145)

## 2023-08-14 LAB — CBC WITH DIFFERENTIAL/PLATELET
Abs Immature Granulocytes: 0.03 10*3/uL (ref 0.00–0.07)
Basophils Absolute: 0 10*3/uL (ref 0.0–0.1)
Basophils Relative: 0 %
Eosinophils Absolute: 0.1 10*3/uL (ref 0.0–0.5)
Eosinophils Relative: 1 %
HCT: 42.5 % (ref 39.0–52.0)
Hemoglobin: 14.1 g/dL (ref 13.0–17.0)
Immature Granulocytes: 0 %
Lymphocytes Relative: 24 %
Lymphs Abs: 2.2 10*3/uL (ref 0.7–4.0)
MCH: 28.3 pg (ref 26.0–34.0)
MCHC: 33.2 g/dL (ref 30.0–36.0)
MCV: 85.3 fL (ref 80.0–100.0)
Monocytes Absolute: 0.8 10*3/uL (ref 0.1–1.0)
Monocytes Relative: 9 %
Neutro Abs: 5.9 10*3/uL (ref 1.7–7.7)
Neutrophils Relative %: 66 %
Platelets: 282 10*3/uL (ref 150–400)
RBC: 4.98 MIL/uL (ref 4.22–5.81)
RDW: 13.2 % (ref 11.5–15.5)
WBC: 9.1 10*3/uL (ref 4.0–10.5)
nRBC: 0 % (ref 0.0–0.2)

## 2023-08-14 LAB — BRAIN NATRIURETIC PEPTIDE: B Natriuretic Peptide: 12.6 pg/mL (ref 0.0–100.0)

## 2023-08-14 MED ORDER — DEXTROSE 5 % IV SOLN
1500.0000 mg | Freq: Once | INTRAVENOUS | Status: AC
  Administered 2023-08-14: 1500 mg via INTRAVENOUS
  Filled 2023-08-14: qty 75

## 2023-08-14 NOTE — Progress Notes (Signed)
Right lower extremity venous duplex has been completed. Preliminary results can be found in CV Proc through chart review.  Results were given to Dr. Freida Busman.  08/14/23 5:06 PM Olen Cordial RVT

## 2023-08-14 NOTE — ED Provider Notes (Signed)
[place] EMERGENCY DEPARTMENT AT [facility] Provider Note   CSN: [id number] Arrival date & time: 08/14/23  1559     History  Chief Complaint  Patient presents with   Leg Swelling    Steven Richardson is a 42 y.o. male.  HPI Per report from police who accompany patient to the emergency department today, he was noted by the jails nurse practitioner to have increased swelling as well as redness in his right leg.  For this reason, she recommended he come to the emergency department to be evaluated.  Patient is unable to provide any additional history.    Home Medications Prior to Admission medications   Medication Sig Start Date End Date Taking? Authorizing Provider  AMBULATORY NON FORMULARY MEDICATION Medication Name: Diltiazem 2%/ Lidocaine 2%  Apply pea sized amount to the first knuckle and to external anal area 3 times a day for 6 weeks. 12/07/20   Arnaldo Natal, NP  benztropine (COGENTIN) 1 MG tablet Take 1 tablet (1 mg total) by mouth 2 (two) times daily. 03/27/20   Aldean Baker, NP  cetirizine (ZYRTEC ALLERGY) 10 MG tablet Take 1 tablet (10 mg total) by mouth daily. 12/02/21   Gailen Shelter, PA  divalproex (DEPAKOTE ER) 500 MG 24 hr tablet Take 2 tablets (1,000 mg total) by mouth 2 (two) times daily. 03/27/20   Aldean Baker, NP  Docusate Calcium (STOOL SOFTENER PO) Take 1 tablet by mouth 2 (two) times daily.    [provider]  docusate sodium (COLACE) 250 MG capsule Take 1 capsule (250 mg total) by mouth daily. 09/02/20   Lurene Shadow, PA-C  doxycycline (VIBRAMYCIN) 100 MG capsule Take 1 capsule (100 mg total) by mouth 2 (two) times daily. 12/02/21   Gailen Shelter, PA  doxycycline (VIBRAMYCIN) 100 MG capsule Take 1 capsule (100 mg total) by mouth 2 (two) times daily. 12/03/21   Cathren Laine, MD  fluticasone (FLONASE) 50 MCG/ACT nasal spray Place 2 sprays into both nostrils daily for 14 days. 12/02/21 12/16/21  Gailen Shelter, PA  hydrocortisone  (ANUSOL-HC) 2.5 % rectal cream Place 1 application rectally 2 (two) times daily. 11/16/20   Couture, Cortni S, PA-C  lithium carbonate (LITHOBID) 300 MG CR tablet Take one tablet (300 mg) by mouth every morning and two tablets (600 mg) by mouth at bedtime. 03/27/20   Aldean Baker, NP  omeprazole (PRILOSEC) 20 MG capsule Take 1 capsule (20 mg total) by mouth daily. 11/16/20   Couture, Cortni S, PA-C  polyethylene glycol (MIRALAX / GLYCOLAX) 17 g packet Take 17 g by mouth daily. 09/02/20   Lurene Shadow, PA-C  propranolol (INDERAL) 20 MG tablet Take 1 tablet (20 mg total) by mouth 2 (two) times daily. 03/27/20   Aldean Baker, NP  QUEtiapine (SEROQUEL) 400 MG tablet Take 1 tablet (400 mg total) by mouth at bedtime for 14 days. Patient taking differently: Take 450 mg by mouth at bedtime. 01/30/21 02/13/21  Long, Arlyss Repress, MD  QUEtiapine (SEROQUEL) 50 MG tablet Take 1 tablet (50 mg total) by mouth daily. Patient taking differently: Take 450 mg by mouth daily. 03/27/20   Aldean Baker, NP  risperiDONE (RISPERDAL) 2 MG tablet Take 1.5 tablets (3 mg total) by mouth at bedtime. 03/27/20   Aldean Baker, NP      Allergies    Patient has no known allergies.    Review of Systems   Review of Systems  Physical Exam  Updated Vital Signs BP (!) 145/57   Pulse 80   Temp 98.1 F (36.7 C) (Oral)   Resp 18   SpO2 100%  Physical Exam Vitals and nursing note reviewed.  Constitutional:      General: He is not in acute distress.    Appearance: He is well-developed.  HENT:     Head: Normocephalic and atraumatic.  Eyes:     Conjunctiva/sclera: Conjunctivae normal.  Cardiovascular:     Rate and Rhythm: Normal rate and regular rhythm.     Heart sounds: No murmur heard. Pulmonary:     Effort: Pulmonary effort is normal. No respiratory distress.     Breath sounds: Normal breath sounds.  Abdominal:     Palpations: Abdomen is soft.     Tenderness: There is no abdominal tenderness.  Musculoskeletal:         General: Swelling present.     Cervical back: Neck supple.  Skin:    General: Skin is warm and dry.     Capillary Refill: Capillary refill takes less than 2 seconds.     Findings: Erythema present.  Neurological:     Mental Status: He is alert.  Psychiatric:        Mood and Affect: Mood normal.     ED Results / Procedures / Treatments   Labs (all labs ordered are listed, but only abnormal results are displayed) Labs Reviewed  BASIC METABOLIC PANEL - Abnormal; Notable for the following components:      Result Value   Potassium 3.3 (*)    Glucose, Bld 113 (*)    All other components within normal limits  CBC WITH DIFFERENTIAL/PLATELET  BRAIN NATRIURETIC PEPTIDE    EKG None  Radiology VAS [place] LOWER EXTREMITY VENOUS (DVT) (ONLY [facility] & [facility])  Result Date: 08/14/2023  Lower Venous DVT Study Patient Name:  Steven Richardson  Date of Exam:   08/14/2023 Medical Rec #: [id number]     Accession #:    [id number] Date of Birth: [birth year]    Patient Gender: M Patient Age:   72 years Exam Location:  [facility] Procedure:      VAS [place] LOWER EXTREMITY VENOUS (DVT) Referring Phys: Lorre Nick --------------------------------------------------------------------------------  Indications: Swelling.  Risk Factors: None identified. Limitations: Body habitus, poor ultrasound/tissue interface and open wound. Comparison Study: No prior studies. Performing Technologist: Chanda Busing RVT  Examination Guidelines: A complete evaluation includes B-mode imaging, spectral Doppler, color Doppler, and power Doppler as needed of all accessible portions of each vessel. Bilateral testing is considered an integral part of a complete examination. Limited examinations for reoccurring indications may be performed as noted. The reflux portion of the exam is performed with the patient in reverse Trendelenburg.  +---------+---------------+---------+-----------+----------+-------------------+ RIGHT     CompressibilityPhasicitySpontaneityPropertiesThrombus Aging      +---------+---------------+---------+-----------+----------+-------------------+ CFV      Full           Yes      Yes                                      +---------+---------------+---------+-----------+----------+-------------------+ SFJ      Full                                                             +---------+---------------+---------+-----------+----------+-------------------+  FV Prox  Full                                                             +---------+---------------+---------+-----------+----------+-------------------+ FV Mid   Full                                                             +---------+---------------+---------+-----------+----------+-------------------+ FV Distal               Yes      Yes                                      +---------+---------------+---------+-----------+----------+-------------------+ PFV      Full                                                             +---------+---------------+---------+-----------+----------+-------------------+ POP      Full           Yes      Yes                                      +---------+---------------+---------+-----------+----------+-------------------+ PTV      Full                                                             +---------+---------------+---------+-----------+----------+-------------------+ PERO                                                  Not well visualized +---------+---------------+---------+-----------+----------+-------------------+   +----+---------------+---------+-----------+----------+--------------+ LEFTCompressibilityPhasicitySpontaneityPropertiesThrombus Aging +----+---------------+---------+-----------+----------+--------------+ CFV Full           Yes      Yes                                  +----+---------------+---------+-----------+----------+--------------+    Summary: RIGHT: - There is no evidence of deep vein thrombosis in the lower extremity. However, portions of this examination were limited- see technologist comments above.  - No cystic structure found in the popliteal fossa.  LEFT: - No evidence of common femoral vein obstruction.   *See table(s) above for measurements and observations. Electronically signed by Heath Lark on 08/14/2023 at 10:46:40 PM.    Final     Procedures Procedures    Medications Ordered in ED Medications  dalbavancin (DALVANCE) 1,500 mg in dextrose 5 % 500 mL  IVPB (0 mg Intravenous Stopped 08/14/23 1838)    ED Course/ Medical Decision Making/ A&P                                 Medical Decision Making Amount and/or Complexity of Data Reviewed Labs: ordered.   Patient is a 42 year old male with a history of hyperglycemia, schizophrenia presenting for leg swelling.  On initial evaluation, he is afebrile, hemodynamically stable, in no acute distress.  Per report from jail, he was noted to have increased swelling primarily of the right lower extremity with associated redness earlier today.  On my exam, there is asymmetry between the legs as well as erythema and warmth of the right lower extremity.  Presentation is most concerning for cellulitis.  Given leg asymmetry, will also obtain lower extremity ultrasound to evaluate for DVT.  BNP sent to evaluate for heart failure which may be contributing to lower extremity edema.  Results reviewed.  CBC without leukocytosis or anemia.  BMP with overall normal electrolytes, no AKI or anion gap.  BNP WNL.  DVT ultrasound without evidence of DVT.  Patient was given 1 dose of dalbavancin in the ED.  Return precautions provided to jail including failure of symptoms to improve, worsening swelling or redness, fever.  Patient discharged that further acute eval under my care in the emergency  department.        Final Clinical Impression(s) / ED Diagnoses Final diagnoses:  Cellulitis of right lower extremity    Rx / DC Orders ED Discharge Orders          Ordered    Ambulatory referral to Infectious Disease       Comments: Cellulitis patient:  Received dalbavancin on 08/14/2023.   08/14/23 1716              Claretha Cooper, DO 08/15/23 0056    Lorre Nick, MD 08/15/23 1630

## 2023-08-14 NOTE — ED Triage Notes (Signed)
Per [facility]  Leg inflammation Leg swelling

## 2023-08-14 NOTE — Progress Notes (Signed)
Pharmacy Note: dalbavancin (DALVANCE) for Acute Bacterial Skin and Skin Structure Infection (ABSSSI) Patients to [facility] Discharge   Steven Richardson is a 42 y.o. male who presented to [facility] on 08/14/2023 with an ABSSSI.  Inclusion criteria:  Indication: Cellulitis  Patient has at least one SIRS criteria present: HR > 90   Exclusion criteria: Patient was evaluated and negative for hardware involvement, hypotension / shock, elevated lactate > 2 without other explanation, gram-negative infection risk factors (bites, water exposure, infection after trauma, infection after skin graft, burns, severe immunocompromise), necrotizing fasciitis possible / confirmed, known or suspected osteomyelitis or septic arthritis, endocarditis, diabetic foot infection, ischemic ulcers, post-operative wound infection, perirectal infection, need for drainage in the OR, hand / facial infections, injection drug users with  fever, bacteremia, pregnancy or breastfeeding, allergy related to antibiotics like vancomycin, known liver disease ( T.Bili > 2x ULN or AST/ALT > 3x ULN).  Daylene Posey 08/14/2023, 5:23 PM Clinical Pharmacist

## 2023-08-14 NOTE — ED Provider Notes (Signed)
I saw and evaluated the patient, reviewed the resident's note and I agree with the findings and plan.       42 year old male who presents with bilateral lower extremity edema.  Doppler ultrasound negative for DVT.  Concern for cellulitis.  Will treat with likely Dalvance and discharged back to law enforcement   Lorre Nick, MD 08/14/23 1718

## 2023-08-14 NOTE — Discharge Instructions (Signed)
You were seen today for leg swelling.  Your ultrasound did not show any DVTs.  We treated you for a skin infection with 1 dose of dalbavancin.  This antibiotic is one-time dosing so you do not need any further prescriptions.  Please return to the emergency department if the swelling is getting worse or not improving over the next few days or if you develop fever.

## 2023-09-04 ENCOUNTER — Encounter (HOSPITAL_COMMUNITY): Payer: Self-pay

## 2023-09-04 ENCOUNTER — Other Ambulatory Visit: Payer: Self-pay

## 2023-09-04 ENCOUNTER — Emergency Department (HOSPITAL_COMMUNITY)
Admission: AD | Admit: 2023-09-04 | Discharge: 2023-09-16 | Disposition: A | Discharge status: Other Acute Inpatient Hospital | Payer: Medicare HMO | Source: Other Acute Inpatient Hospital | Attending: Family Medicine | Admitting: Family Medicine

## 2023-09-04 ENCOUNTER — Ambulatory Visit (INDEPENDENT_AMBULATORY_CARE_PROVIDER_SITE_OTHER)
Admission: EM | Admit: 2023-09-04 | Discharge: 2023-09-04 | Disposition: A | Discharge status: Other Acute Inpatient Hospital | Payer: Medicare HMO | Source: Home / Self Care

## 2023-09-04 DIAGNOSIS — F17210 Nicotine dependence, cigarettes, uncomplicated: Secondary | ICD-10-CM | POA: Diagnosis not present

## 2023-09-04 DIAGNOSIS — R461 Bizarre personal appearance: Secondary | ICD-10-CM | POA: Insufficient documentation

## 2023-09-04 DIAGNOSIS — F201 Disorganized schizophrenia: Secondary | ICD-10-CM | POA: Insufficient documentation

## 2023-09-04 DIAGNOSIS — R443 Hallucinations, unspecified: Secondary | ICD-10-CM | POA: Insufficient documentation

## 2023-09-04 DIAGNOSIS — Z008 Encounter for other general examination: Secondary | ICD-10-CM | POA: Insufficient documentation

## 2023-09-04 DIAGNOSIS — F259 Schizoaffective disorder, unspecified: Secondary | ICD-10-CM | POA: Insufficient documentation

## 2023-09-04 DIAGNOSIS — Z5986 Financial insecurity: Secondary | ICD-10-CM | POA: Diagnosis not present

## 2023-09-04 DIAGNOSIS — Z833 Family history of diabetes mellitus: Secondary | ICD-10-CM | POA: Diagnosis not present

## 2023-09-04 DIAGNOSIS — E876 Hypokalemia: Secondary | ICD-10-CM | POA: Insufficient documentation

## 2023-09-04 DIAGNOSIS — E119 Type 2 diabetes mellitus without complications: Secondary | ICD-10-CM | POA: Insufficient documentation

## 2023-09-04 DIAGNOSIS — F250 Schizoaffective disorder, bipolar type: Secondary | ICD-10-CM | POA: Insufficient documentation

## 2023-09-04 DIAGNOSIS — F22 Delusional disorders: Secondary | ICD-10-CM | POA: Insufficient documentation

## 2023-09-04 DIAGNOSIS — R440 Auditory hallucinations: Principal | ICD-10-CM

## 2023-09-04 LAB — CBC WITH DIFFERENTIAL/PLATELET
Abs Immature Granulocytes: 0.01 10*3/uL (ref 0.00–0.07)
Basophils Absolute: 0 10*3/uL (ref 0.0–0.1)
Basophils Relative: 1 %
Eosinophils Absolute: 0.1 10*3/uL (ref 0.0–0.5)
Eosinophils Relative: 2 %
HCT: 41.5 % (ref 39.0–52.0)
Hemoglobin: 14.4 g/dL (ref 13.0–17.0)
Immature Granulocytes: 0 %
Lymphocytes Relative: 44 %
Lymphs Abs: 2.9 10*3/uL (ref 0.7–4.0)
MCH: 28.4 pg (ref 26.0–34.0)
MCHC: 34.7 g/dL (ref 30.0–36.0)
MCV: 81.9 fL (ref 80.0–100.0)
Monocytes Absolute: 0.5 10*3/uL (ref 0.1–1.0)
Monocytes Relative: 8 %
Neutro Abs: 2.9 10*3/uL (ref 1.7–7.7)
Neutrophils Relative %: 45 %
Platelets: 259 10*3/uL (ref 150–400)
RBC: 5.07 MIL/uL (ref 4.22–5.81)
RDW: 13 % (ref 11.5–15.5)
WBC: 6.5 10*3/uL (ref 4.0–10.5)
nRBC: 0 % (ref 0.0–0.2)

## 2023-09-04 LAB — COMPREHENSIVE METABOLIC PANEL
ALT: 41 U/L (ref 0–44)
AST: 32 U/L (ref 15–41)
Albumin: 3.6 g/dL (ref 3.5–5.0)
Alkaline Phosphatase: 68 U/L (ref 38–126)
Anion gap: 8 (ref 5–15)
BUN: 8 mg/dL (ref 6–20)
CO2: 26 mmol/L (ref 22–32)
Calcium: 9.3 mg/dL (ref 8.9–10.3)
Chloride: 106 mmol/L (ref 98–111)
Creatinine, Ser: 0.58 mg/dL — ABNORMAL LOW (ref 0.61–1.24)
GFR, Estimated: >60 mL/min (ref >60)
Glucose, Bld: 95 mg/dL (ref 70–99)
Potassium: 3.2 mmol/L — ABNORMAL LOW (ref 3.5–5.1)
Sodium: 140 mmol/L (ref 135–145)
Total Bilirubin: 1.7 mg/dL — ABNORMAL HIGH (ref 0.3–1.2)
Total Protein: 6.6 g/dL (ref 6.5–8.1)

## 2023-09-04 LAB — VALPROIC ACID LEVEL: Valproic Acid Lvl: <10 ug/mL — ABNORMAL LOW (ref 50.0–100.0)

## 2023-09-04 LAB — RAPID URINE DRUG SCREEN, HOSP PERFORMED
Amphetamines: NOT DETECTED
Barbiturates: NOT DETECTED
Benzodiazepines: NOT DETECTED
Cocaine: NOT DETECTED
Opiates: NOT DETECTED
Tetrahydrocannabinol: NOT DETECTED

## 2023-09-04 LAB — LITHIUM LEVEL: Lithium Lvl: <0.06 mmol/L — ABNORMAL LOW (ref 0.60–1.20)

## 2023-09-04 LAB — ETHANOL: Alcohol, Ethyl (B): <10 mg/dL (ref <10)

## 2023-09-04 MED ORDER — STERILE WATER FOR INJECTION IJ SOLN
INTRAMUSCULAR | Status: AC
  Administered 2023-09-04: 1.2 mL via INTRAMUSCULAR
  Filled 2023-09-04: qty 10

## 2023-09-04 MED ORDER — ZIPRASIDONE MESYLATE 20 MG IM SOLR: 20.0000 mg | INTRAMUSCULAR | Status: DC | PRN

## 2023-09-04 MED ORDER — LORAZEPAM 1 MG PO TABS
1.0000 mg | ORAL_TABLET | ORAL | Status: AC | PRN
  Administered 2023-09-05: 1 mg via ORAL
  Filled 2023-09-04: qty 1

## 2023-09-04 MED ORDER — RISPERIDONE 1 MG PO TBDP
2.0000 mg | ORAL_TABLET | Freq: Three times a day (TID) | ORAL | Status: DC | PRN
  Administered 2023-09-05 (×2): 2 mg via ORAL
  Filled 2023-09-04 (×3): qty 2

## 2023-09-04 MED ORDER — ZIPRASIDONE MESYLATE 20 MG IM SOLR
20.0000 mg | Freq: Once | INTRAMUSCULAR | Status: AC
  Administered 2023-09-04: 20 mg via INTRAMUSCULAR
  Filled 2023-09-04: qty 20

## 2023-09-04 NOTE — ED Provider Notes (Addendum)
[place] EMERGENCY DEPARTMENT AT [facility] Provider Note   CSN: [id number] Arrival date & time: 09/04/23  1815     History  Chief Complaint  Patient presents with   Medical Clearance    Steven Richardson is a 42 y.o. male.  HPI 42 year old male presents today under IVC.  Reportedly he was in the jail.  Upon being discharged he acted more psychotic.  Review of his IVC papers reports that he has been evaluated for [place] their physician and needs to be evaluated in custody.  Auditory hallucinations and conversing with those voices and that he responded to a book to his judge that he would rape her.  Patient gives me no further history and is speaking intermittently in [place] and speaking when no one is there is not answering questions clearly.    Home Medications Prior to Admission medications   Medication Sig Start Date End Date Taking? Authorizing Provider  AMBULATORY NON FORMULARY MEDICATION Medication Name: Diltiazem 2%/ Lidocaine 2%  Apply pea sized amount to the first knuckle and to external anal area 3 times a day for 6 weeks. 12/07/20   Arnaldo Natal, NP  benztropine (COGENTIN) 1 MG tablet Take 1 tablet (1 mg total) by mouth 2 (two) times daily. 03/27/20   Aldean Baker, NP  cetirizine (ZYRTEC ALLERGY) 10 MG tablet Take 1 tablet (10 mg total) by mouth daily. 12/02/21   Gailen Shelter, PA  divalproex (DEPAKOTE ER) 500 MG 24 hr tablet Take 2 tablets (1,000 mg total) by mouth 2 (two) times daily. 03/27/20   Aldean Baker, NP  Docusate Calcium (STOOL SOFTENER PO) Take 1 tablet by mouth 2 (two) times daily.    [provider]  docusate sodium (COLACE) 250 MG capsule Take 1 capsule (250 mg total) by mouth daily. 09/02/20   Lurene Shadow, PA-C  doxycycline (VIBRAMYCIN) 100 MG capsule Take 1 capsule (100 mg total) by mouth 2 (two) times daily. 12/02/21   Gailen Shelter, PA  doxycycline (VIBRAMYCIN) 100 MG capsule Take 1 capsule (100 mg total) by mouth 2  (two) times daily. 12/03/21   Cathren Laine, MD  fluticasone (FLONASE) 50 MCG/ACT nasal spray Place 2 sprays into both nostrils daily for 14 days. 12/02/21 12/16/21  Gailen Shelter, PA  hydrocortisone (ANUSOL-HC) 2.5 % rectal cream Place 1 application rectally 2 (two) times daily. 11/16/20   Couture, Cortni S, PA-C  lithium carbonate (LITHOBID) 300 MG CR tablet Take one tablet (300 mg) by mouth every morning and two tablets (600 mg) by mouth at bedtime. 03/27/20   Aldean Baker, NP  omeprazole (PRILOSEC) 20 MG capsule Take 1 capsule (20 mg total) by mouth daily. 11/16/20   Couture, Cortni S, PA-C  polyethylene glycol (MIRALAX / GLYCOLAX) 17 g packet Take 17 g by mouth daily. 09/02/20   Lurene Shadow, PA-C  propranolol (INDERAL) 20 MG tablet Take 1 tablet (20 mg total) by mouth 2 (two) times daily. 03/27/20   Aldean Baker, NP  QUEtiapine (SEROQUEL) 400 MG tablet Take 1 tablet (400 mg total) by mouth at bedtime for 14 days. Patient taking differently: Take 450 mg by mouth at bedtime. 01/30/21 02/13/21  Long, Arlyss Repress, MD  QUEtiapine (SEROQUEL) 50 MG tablet Take 1 tablet (50 mg total) by mouth daily. Patient taking differently: Take 450 mg by mouth daily. 03/27/20   Aldean Baker, NP  risperiDONE (RISPERDAL) 2 MG tablet Take 1.5 tablets (3 mg total) by mouth at  bedtime. 03/27/20   Aldean Baker, NP      Allergies    Patient has no known allergies.    Review of Systems   Review of Systems  Physical Exam Updated Vital Signs BP 126/75 (BP Location: Right Arm)   Pulse 74   Temp 98.2 F (36.8 C) (Oral)   Resp 17   Ht 1.803 m (5\' 11" )   Wt (!) 162 kg   SpO2 99%   BMI 49.81 kg/m  Physical Exam Vitals reviewed.  HENT:     Head: Normocephalic.     Right Ear: External ear normal.     Left Ear: External ear normal.     Nose: Nose normal.  Eyes:     Pupils: Pupils are equal, round, and reactive to light.  Cardiovascular:     Rate and Rhythm: Normal rate.     Pulses: Normal pulses.  Pulmonary:      Effort: Pulmonary effort is normal.  Abdominal:     General: Abdomen is flat.     Palpations: Abdomen is soft.  Musculoskeletal:        General: Normal range of motion.     Cervical back: Normal range of motion.  Skin:    General: Skin is warm.     Capillary Refill: Capillary refill takes less than 2 seconds.     Comments: Chronic skin changes noted bilateral lower extremities with some erythema and some abrasions to right lower extremity  Neurological:     General: No focal deficit present.     Mental Status: He is alert.     Cranial Nerves: No cranial nerve deficit.     Motor: No weakness.     Coordination: Coordination normal.  Psychiatric:        Attention and Perception: He is inattentive.        Mood and Affect: Affect is labile and inappropriate.        Speech: Speech is rapid and pressured.        Behavior: Behavior is agitated and hyperactive.     Comments: Unable to assess thought content condition and memory or judgment     ED Results / Procedures / Treatments   Labs (all labs ordered are listed, but only abnormal results are displayed) Labs Reviewed  COMPREHENSIVE METABOLIC PANEL - Abnormal; Notable for the following components:      Result Value   Potassium 3.2 (*)    Creatinine, Ser 0.58 (*)    Total Bilirubin 1.7 (*)    All other components within normal limits  VALPROIC ACID LEVEL - Abnormal; Notable for the following components:   Valproic Acid Lvl <10 (*)    All other components within normal limits  LITHIUM LEVEL - Abnormal; Notable for the following components:   Lithium Lvl <0.06 (*)    All other components within normal limits  ETHANOL  CBC WITH DIFFERENTIAL/PLATELET  RAPID URINE DRUG SCREEN, HOSP PERFORMED    EKG None  Radiology No results found.  Procedures .Critical Care  Performed by: Margarita Grizzle, MD Authorized by: Margarita Grizzle, MD   Critical care provider statement:    Critical care time (minutes):  30   Critical care was  necessary to treat or prevent imminent or life-threatening deterioration of the following conditions:  CNS failure or compromise   Critical care was time spent personally by me on the following activities:  Development of treatment plan with patient or surrogate, discussions with consultants, evaluation of patient's response to treatment,  examination of patient, ordering and review of laboratory studies, ordering and review of radiographic studies, ordering and performing treatments and interventions, pulse oximetry, re-evaluation of patient's condition and review of old charts     Medications Ordered in ED Medications  risperiDONE (RISPERDAL M-TABS) disintegrating tablet 2 mg (has no administration in time range)    And  LORazepam (ATIVAN) tablet 1 mg (has no administration in time range)    And  ziprasidone (GEODON) injection 20 mg (has no administration in time range)  ziprasidone (GEODON) injection 20 mg (20 mg Intramuscular Given 09/04/23 1900)  sterile water (preservative free) injection (1.2 mLs Intramuscular Given 09/04/23 1921)    ED Course/ Medical Decision Making/ A&P Clinical Course as of 09/04/23 2313  Thu Sep 04, 2023  2032 ECG reviewed interpreted and within normal limits Alcohol level is negative Complete metabolic panel reviewed interpreted send for mild hypokalemia with potassium of 3.2 Urine drug screen has not been collected [DR]    Clinical Course User Index [DR] Margarita Grizzle, MD                                 Medical Decision Making Amount and/or Complexity of Data Reviewed Labs: ordered.  Risk Prescription drug management.   This is a 42 year old male with a history of schizophrenia who presents today with law enforcement with IVC in place.  Per their report patient has been agitated confused and responding to [place] hallucinations. On my initial evaluation here patient is very agitated and unable to answer my questions. Patient treated here with Geodon  IM now resting comfortably. Labs are reviewed and essentially normal mild hypokalemia Urine drug screen is not resulted yet Alcohol level is normal Vital signs are stable Patient appears to be medically cleared behavioral health evaluation   Depakote and lithium levels are not actable    Final Clinical Impression(s) / ED Diagnoses Final diagnoses:  Auditory hallucination    Rx / DC Orders ED Discharge Orders          Ordered    Lithium level  Status:  Canceled        09/04/23 2040              Margarita Grizzle, MD 09/04/23 0981    Margarita Grizzle, MD 09/04/23 1914    Margarita Grizzle, MD 09/04/23 2313

## 2023-09-04 NOTE — Progress Notes (Signed)
   09/04/23 1548  [facility] Triage Screening (Walk-ins at [facility] only)  How Did You Hear About [place]? Legal System (Patient placed on IVC from the jail)  What Is the Reason for Your Visit/Call Today? Patient was brought from the jail on IVC petitioned by Dr. Glorianne Manchester.  Patient was in custory for a violation of a protection order.  When he appeared in court, he told the judge that he was going to rape her. Patient has appaently been off of his medications and experiencing auditory hallucinations.  Patient denies SI/HI.  When asked why he was in jail, he states "I was on vacation."  Patient is disorganized and having a hrd time focusing to answer questions.  He smells of urine and most likely has not bathed in awhile.  He is somewhat mainc and not very cooperative.  Due to his potential for aggression towards women, he will have to be sent to the ED.  Patient is urgent due to his hallucinations and aggression.  How Long Has This Been Causing You Problems? <Week  Have You Recently Had Any Thoughts About Hurting Yourself? No  Are You Planning to Commit Suicide/Harm Yourself At This time? No  Have you Recently Had Thoughts About Hurting Someone Karolee Ohs? No  Are You Planning To Harm Someone At This Time? No  Are you currently experiencing any auditory, visual or other hallucinations? No  Have You Used Any Alcohol or Drugs in the Past 24 Hours? No  Do you have any current medical co-morbidities that require immediate attention? No  Clinician description of patient physical appearance/behavior: patient is unkept in appearance and not very cooperative  What Do You Feel Would Help You the Most Today? Treatment for Depression or other mood problem  If access to [facility] was not available, would you have sought care in the Emergency Department? Yes  Determination of Need Urgent (48 hours)  Options For Referral Inpatient Hospitalization

## 2023-09-04 NOTE — ED Notes (Signed)
Nurse to nurse report given to [facility], charge nurse at [facility] of pt's acceptance by Dr. Donnald Garre, MD. [employer] called for transport.

## 2023-09-04 NOTE — Discharge Instructions (Addendum)
Transferred to [facility].

## 2023-09-04 NOTE — ED Provider Notes (Signed)
Behavioral Health Urgent Care Medical Screening Exam  Patient Name: Steven Richardson MRN: [id number] Date of Evaluation: 09/04/23 Chief Complaint:  "Hey Baby, come sit with me" Diagnosis:  Final diagnoses:  Disorganized schizophrenia (HCC)    History of Present illness: Steven Richardson is a 42 y.o. male, schizophrenia, type 2 diabetes, morbid obesity.  Steven Richardson 42 y.o., male patient presented to [facility] as a walk in, under IVC petition ,  accompanied by [employer], transported from jail for mental health evaluation and restart of medications.   Steven Richardson, 42 y.o., male patient seen face to face by this provider, consulted with Dr. Lucianne Richardson; and chart reviewed on 09/04/23  IVC petition indicates that forensic provider Steven Richardson had recommended the patient be transported here for psychiatric evaluation.  Per report from security staff patient has been discharged from jail and was brought here for mental health evaluation.  Patient has been disorganized with his thoughts and threatening to rape a judge and that was the reason he was incarcerated.  However it is unclear as to whether or not patient has been taking his medication while he was in jail as he is unwilling to disclose this information when asked several times.  Patient is going back and forth from speaking in Arabic, speaking in a nonsensical manner, and [place]. When this writer initially walks up to him he states "Hey baby come sit with me".  When this writer began to ask him questions about his mental health he begins to speak initially Arabic and then transitions to nonsensical language.  He did at some point tell this Clinical research associate that he is hearing hallucinations but when asked what the hallucinations were he refused to disclose this information.  He also urinated upon entering the facility on the floor.  He appears disheveled and smells of urine. He is mildly agitated and is pushing and pulling the table in the exam room and required redirection by  security frequently during interview.  He making kissing gestures intermittently during interview.  Per chart review, patient's last psychiatric hospitalization was May 04, 2023 and patient was discharged on the following medications Haloperidol 10 mg tablet BID,  hydroxyzine pamoate 50 mg capsule, TID PRN, lithium carbonate 900 mg ER tablet every 12 hours, and Trazodone  50 mg tablet Insomnia.  It is unclear when the patient last took his medications.   During evaluation Steven Richardson is in no acute distress. He is alert, oriented to self and time. He is hyperactive, anxious, with intrusive-bizarre  behaviors. His mood is euphoric and liable with congruent affect. He is speaking  in a pressure nonsensical manner. Objectively , patient appears to be having decompensated symptoms related to schizophrenia in addition to exhibiting some mania type behaviors and odd behaviors.  Patient is easily distractible and inattentive.  On evaluation patient does not appear to be responding to internal and external stimuli.  Patient endorses hallucinations however would not elaborate.   Due to sexually intrusiveness and risk for safety of other patients, there are  no appropriate beds currently in the milieu. Patient also has other medical co morbidities  that require medical evaluation therefore , patient has been accepted to [facility] by Dr. Donnald Garre for medical clearance. At present patient would benefit from psychiatric inpatient for acute symptom management and medication management and psychiatric stabilization.     Flowsheet Row ED from 09/04/2023 in [facility] ED from 12/02/2021 in [facility] Emergency Department at [facility] ED from 08/18/2021 in [facility] at [facility]  C-SSRS RISK CATEGORY No Risk No Risk No Risk       Psychiatric Specialty Exam  Presentation  General Appearance:Disheveled  Eye  Contact:Fair  Speech:Pressured  Speech Volume:Increased  Handedness:Right   Mood and Affect  Mood: Labile; Dysphoric  Affect: Inappropriate; Labile   Thought Process  Thought Processes: Disorganized  Descriptions of Associations:Tangential  Orientation:Full (Time, Place and Person)  Thought Content:Illogical; Tangential    Hallucinations:Other (comment) Endorses hallucinations will not disclose what the voices are saying  Ideas of Reference:Paranoia  Suicidal Thoughts:No  Homicidal Thoughts:No   Sensorium  Memory: Immediate Fair  Judgment: Impaired  Insight: None   Executive Functions  Concentration: Poor  Attention Span: Poor  Recall: Poor  Fund of Knowledge: Poor  Language: Poor   Psychomotor Activity  Psychomotor Activity: Restlessness   Assets  Assets: Manufacturing systems engineer; Resilience   Sleep  Sleep: -- (Unknown due to patient lack of willingness to engage in interview)  Number of hours:  0 (unknown)   Physical Exam: Physical Exam Vitals reviewed.  Constitutional:      Appearance: He is obese.     Comments: Chronically ill appearing   HENT:     Head: Normocephalic and atraumatic.  Eyes:     Extraocular Movements: Extraocular movements intact.     Pupils: Pupils are equal, round, and reactive to light.  Cardiovascular:     Rate and Rhythm: Normal rate and regular rhythm.  Pulmonary:     Effort: Pulmonary effort is normal.     Breath sounds: Normal breath sounds.  Musculoskeletal:     Cervical back: Normal range of motion.     Right lower leg: 2+ Edema present.     Left lower leg: 2+ Edema present.  Neurological:     General: No focal deficit present.     Review of Systems  Psychiatric/Behavioral:  Positive for hallucinations.    Blood pressure (!) 150/99, pulse 79, temperature 98.4 F (36.9 C), temperature source Oral, resp. rate 18, SpO2 98%. There is no height or weight on file to calculate  BMI.  Musculoskeletal: Strength & Muscle Tone: within normal limits Gait & Station: normal Patient leans: N/A   [facility] MSE Discharge Disposition for Follow up and Recommendations: Based on my evaluation the patient appears to have an emergency medical condition for which I recommend the patient be transferred to the emergency department for further evaluation.  Transferred to [facility] for medical clearance. At present, inpatient psychiatric treatment is recommended, once medically cleared. Dr. Donnald Garre is accepting physician.    Joaquin Courts, NP 09/04/2023, 4:02 PM

## 2023-09-04 NOTE — ED Notes (Signed)
IVC expires 09/11/2023

## 2023-09-04 NOTE — ED Triage Notes (Signed)
Pt was seen today for IVC, was recommended he have inpatient treatment but was sent to ED for medical clearance prior to placement due to not having space a [facility]UC.

## 2023-09-05 DIAGNOSIS — R440 Auditory hallucinations: Secondary | ICD-10-CM

## 2023-09-05 DIAGNOSIS — F259 Schizoaffective disorder, unspecified: Secondary | ICD-10-CM | POA: Diagnosis present

## 2023-09-05 DIAGNOSIS — F250 Schizoaffective disorder, bipolar type: Secondary | ICD-10-CM

## 2023-09-05 MED ORDER — DIPHENHYDRAMINE HCL 25 MG PO CAPS
50.0000 mg | ORAL_CAPSULE | Freq: Four times a day (QID) | ORAL | Status: DC | PRN
  Administered 2023-09-05: 50 mg via ORAL
  Filled 2023-09-05: qty 2

## 2023-09-05 MED ORDER — DIPHENHYDRAMINE HCL 50 MG/ML IJ SOLN: 50.0000 mg | Freq: Four times a day (QID) | INTRAMUSCULAR | Status: DC | PRN

## 2023-09-05 MED ORDER — HALOPERIDOL 5 MG PO TABS
10.0000 mg | ORAL_TABLET | Freq: Two times a day (BID) | ORAL | Status: DC
  Administered 2023-09-05 – 2023-09-16 (×23): 10 mg via ORAL
  Filled 2023-09-05 (×23): qty 2

## 2023-09-05 MED ORDER — POTASSIUM CHLORIDE CRYS ER 20 MEQ PO TBCR
40.0000 meq | EXTENDED_RELEASE_TABLET | Freq: Once | ORAL | Status: AC
  Administered 2023-09-05: 40 meq via ORAL
  Filled 2023-09-05: qty 2

## 2023-09-05 MED ORDER — TRAZODONE HCL 50 MG PO TABS
50.0000 mg | ORAL_TABLET | Freq: Every day | ORAL | Status: DC
  Administered 2023-09-05 – 2023-09-12 (×8): 50 mg via ORAL
  Filled 2023-09-05 (×8): qty 1

## 2023-09-05 MED ORDER — CEPHALEXIN 250 MG PO CAPS
500.0000 mg | ORAL_CAPSULE | Freq: Two times a day (BID) | ORAL | Status: AC
  Administered 2023-09-05 – 2023-09-11 (×14): 500 mg via ORAL
  Filled 2023-09-05 (×14): qty 2

## 2023-09-05 MED ORDER — LORAZEPAM 1 MG PO TABS
2.0000 mg | ORAL_TABLET | Freq: Four times a day (QID) | ORAL | Status: DC | PRN
  Administered 2023-09-05 – 2023-09-06 (×2): 2 mg via ORAL
  Filled 2023-09-05 (×2): qty 2

## 2023-09-05 MED ORDER — LORAZEPAM 2 MG/ML IJ SOLN: 2.0000 mg | Freq: Four times a day (QID) | INTRAMUSCULAR | Status: DC | PRN

## 2023-09-05 MED ORDER — LORAZEPAM 1 MG PO TABS: 2.0000 mg | ORAL_TABLET | Freq: Four times a day (QID) | ORAL | Status: DC | PRN

## 2023-09-05 MED ORDER — LITHIUM CARBONATE ER 450 MG PO TBCR
900.0000 mg | EXTENDED_RELEASE_TABLET | Freq: Two times a day (BID) | ORAL | Status: DC
  Administered 2023-09-05 – 2023-09-16 (×23): 900 mg via ORAL
  Filled 2023-09-05 (×24): qty 2

## 2023-09-05 MED ORDER — HYDROXYZINE HCL 25 MG PO TABS
25.0000 mg | ORAL_TABLET | Freq: Four times a day (QID) | ORAL | Status: DC | PRN
  Administered 2023-09-08 – 2023-09-14 (×3): 25 mg via ORAL
  Filled 2023-09-05 (×3): qty 1

## 2023-09-05 NOTE — ED Notes (Signed)
Pt speaking English and nonsensical speech. Pt disorganized. Pt attempted to touch sitter's hand. Pt redirected multiple times to stay in room. Pt hyperactive, bizarre behavior, giddy, making odd noises.

## 2023-09-05 NOTE — ED Notes (Signed)
Patient is extremely manic; Patient comes out of the room and attempts to interact with male patients and/or staff; Pt does not know personal boundaries and has to be redirected constantly; Pt is not aggressive but hypersexual; pt in room beating on things and talking loud. RN was able to get patient to take PO oral meds-Monique,RN

## 2023-09-05 NOTE — ED Notes (Addendum)
Pt redirected back to room and pt pulled code blue alarm. Educated pt that he cannot do that.

## 2023-09-05 NOTE — ED Notes (Signed)
Pt came out of room again and had to be redirected back to his room.

## 2023-09-05 NOTE — ED Provider Notes (Signed)
Patient complaining of right leg pain and redness that has been going on for several months.  See image below.  Does have some pitting edema of the leg.  Did have an ultrasound on 08/14/2023 that did not show evidence of DVT.  Will go ahead and treat him with Keflex at this time for cellulitis.  Right lower extremity:    Rondel Baton, MD 09/05/23 1112

## 2023-09-05 NOTE — Progress Notes (Signed)
LCSW Progress Note  [id number]   Steven Richardson  09/05/2023  6:18 PM    Inpatient Behavioral Health Placement  Pt meets inpatient criteria per Phebe Colla, NP. There are no available beds within [facility] per [facility] AC Kelly Southard,RN. Referral was sent to the following facilities;   Destination  Service Provider Address Phone Fax  [facility]  [address] [place] [address] [phone]  [facility]  [address] [place], [place] [address] [phone]  [facility] Patient Placement  [facility] [place], [place] [phone]  [facility] [address]  [place] [address] [phone]  [facility]  [address] [place] [address] [phone]  [facility]  [address] [place] [address] [phone]  [facility]  [address], [place] [address] [phone]  [facility]  [address], [place] [address] [phone]  [facility]  [address], [place] [address] [phone]  [facility]  [address] [place], [place] [phone]  [facility]  [address], [place] [address] [phone]  [facility]  [address] [place] [address] [phone]  [facility]  [address], [place] [address] [phone]  [facility]  [address], [place] [address] [phone]  [facility]  [address], [place] [address] [phone]  [facility]   [address] [place] [address] [phone]  [facility] [address]  [address], [place] [address] [phone]  [facility]  [address] [place], [place] [address] [phone]  [facility]  [address] [place] [address] [phone]  [facility]  [address] [place] [address] [phone]  [facility]  [address] [place] [address] [phone]  [facility]  [address] [place] [address] [phone]  [facility]  [address] [place] [address] [phone]  [facility] EFAX  [place] [phone]    Situation ongoing,  CSW will follow up.    Maryjean Ka, MSW, LCSWA 09/05/2023 6:18 PM

## 2023-09-05 NOTE — ED Provider Notes (Signed)
Pt not currently receiving his normal bh meds.   Chart reviewed including last [facility] admission in [facility] system.  Patients BH meds re-started c/w what he was previously taking.   Will consult BH team to reassess, as no current update on plan for disposition and placement ? Possibly to [facility] if longer [facility] treatment is anticipated, or to [facility] if [facility] team feels brief obs stay with restarting of meds is plan.   Pt is alert, conversant. Some delusional thoughts noted. No thoughts of harm to self or others.      Cathren Laine, MD 09/05/23 [phone]

## 2023-09-05 NOTE — ED Notes (Signed)
Pt came out of room and had to be redirected back into room x 2.

## 2023-09-05 NOTE — ED Notes (Signed)
Pt moved to room 52 to keep him away from other pts in the zone d/t pts currently in purple are all females. Pt moved for other pt safety.

## 2023-09-05 NOTE — ED Notes (Signed)
Pt came out of room and tried to grab a newspaper, told pt that he could take a newspaper and then he had to go to his room. Pt then said he had to use the bathroom and attempted to enter another pt's room. Pt was quickly redirected w/ light touch back to his room.

## 2023-09-05 NOTE — ED Notes (Signed)
BH NP at bedside assessing pt

## 2023-09-05 NOTE — ED Notes (Signed)
Pt talking to self in room.

## 2023-09-05 NOTE — ED Notes (Signed)
Pt attempted to try to touch this RN's stomach. This RN backed away and told pt not to touch me. [employer] redirected pt back to room. While giving pt meds pt put pills in his mouth seductively, this RN told pt to take his meds and to stop that behavior.

## 2023-09-05 NOTE — ED Notes (Signed)
R leg swollen, not warm to touch, non-pitting. Notified EDP

## 2023-09-05 NOTE — ED Provider Notes (Signed)
Emergency Medicine Observation Re-evaluation Note  Steven Richardson is a 42 y.o. male, seen on rounds today.  Pt initially presented to the ED for complaints of auditory hallucinations and Medical Clearance Currently, the patient is not having any acute complaints.  Physical Exam  BP 111/75 (BP Location: Left Arm)   Pulse 79   Temp 98.8 F (37.1 C) (Oral)   Resp 16   Ht 5\' 11"  (1.803 m)   Wt (!) 162 kg   SpO2 100%   BMI 49.81 kg/m  Physical Exam General: Resting comfortably in stretcher Lungs: Normal work of breathing Psych: Calm  ED Course / MDM  EKG:   I have reviewed the labs performed to date as well as medications administered while in observation.  Recent changes in the last 24 hours include patient continues to be manic coming out of the room and does not know personal boundaries.  Has been hypersexual.  Awaiting TTS evaluation.  Plan  Current plan is for TTS evaluation.  Replenish potassium  Risperdal and Ativan as needed for agitation   Rondel Baton, MD 09/05/23 [phone]
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
## 2023-09-05 NOTE — ED Notes (Signed)
Pt bathroom privileges have been revoked d/t pt had urinated on the bathroom floor x 2. Urinal provided to pt.

## 2023-09-05 NOTE — ED Notes (Signed)
Pt oriented to self, year, intermittently place, disoriented to situation. When asking pt where he is at right now, pt states, "In your heart". Asked pt what building he is in and pt responded w/ nonsensical language and then mentioned [place]. Pt says he is here because this is where he lives.

## 2023-09-06 ENCOUNTER — Encounter (HOSPITAL_COMMUNITY): Payer: Self-pay

## 2023-09-06 DIAGNOSIS — F250 Schizoaffective disorder, bipolar type: Secondary | ICD-10-CM | POA: Diagnosis not present

## 2023-09-06 MED ORDER — HALOPERIDOL 5 MG PO TABS
5.0000 mg | ORAL_TABLET | Freq: Four times a day (QID) | ORAL | Status: DC | PRN
  Administered 2023-09-07 – 2023-09-13 (×6): 5 mg via ORAL
  Filled 2023-09-06 (×6): qty 1

## 2023-09-06 MED ORDER — HALOPERIDOL LACTATE 5 MG/ML IJ SOLN
5.0000 mg | Freq: Four times a day (QID) | INTRAMUSCULAR | Status: DC | PRN
  Administered 2023-09-07 – 2023-09-09 (×2): 5 mg via INTRAMUSCULAR
  Filled 2023-09-06 (×3): qty 1

## 2023-09-06 MED ORDER — LORAZEPAM 1 MG PO TABS
2.0000 mg | ORAL_TABLET | Freq: Four times a day (QID) | ORAL | Status: DC | PRN
  Administered 2023-09-07 – 2023-09-16 (×10): 2 mg via ORAL
  Filled 2023-09-06 (×10): qty 2

## 2023-09-06 MED ORDER — DIPHENHYDRAMINE HCL 50 MG/ML IJ SOLN
50.0000 mg | Freq: Four times a day (QID) | INTRAMUSCULAR | Status: DC | PRN
  Administered 2023-09-07 – 2023-09-09 (×2): 50 mg via INTRAMUSCULAR
  Filled 2023-09-06 (×2): qty 1

## 2023-09-06 MED ORDER — LORAZEPAM 2 MG/ML IJ SOLN
2.0000 mg | Freq: Four times a day (QID) | INTRAMUSCULAR | Status: DC | PRN
  Administered 2023-09-06 – 2023-09-09 (×3): 2 mg via INTRAMUSCULAR
  Filled 2023-09-06 (×3): qty 1

## 2023-09-06 MED ORDER — DIPHENHYDRAMINE HCL 25 MG PO CAPS
50.0000 mg | ORAL_CAPSULE | Freq: Four times a day (QID) | ORAL | Status: DC | PRN
  Administered 2023-09-06 – 2023-09-13 (×11): 50 mg via ORAL
  Filled 2023-09-06 (×11): qty 2

## 2023-09-06 NOTE — ED Notes (Signed)
Pt started drinking from toilet while in restroom.  Pt redirected back to room at this time.

## 2023-09-06 NOTE — ED Notes (Signed)
Pt constantly coming out of room to nurse station.  Most recently, pt stepped to desk and attempted to take this RN's water from desk.  Pt redirected back to room.  Pt then came back out and sat on floor.  Pt instructed to go back into room.
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
## 2023-09-06 NOTE — ED Notes (Signed)
Pt back up to restroom.  On way back to room, pt grabbed Glucometer from behind nurse station.  This RN and security took Glucometer from pt and instructed pt to go back into his room.
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
## 2023-09-06 NOTE — ED Notes (Signed)
Pt received for care at 1900.  At this time, pt sitting on bed, loudly talking to self.  Bed in lowest position, wheels locked.  Pt in view of sitter with necessary precautions maintained.

## 2023-09-06 NOTE — ED Notes (Addendum)
Patient coming out to hallway multiple times, Patient not so easily directed back to room by staff, Hesitant to return to room once told and directed. Patient becoming louder in room. Patient is throwing clothing, Patient has a tendency to intentionally touch male staff fingers when handed things.

## 2023-09-06 NOTE — ED Provider Notes (Signed)
Emergency Medicine Observation Re-evaluation Note  Steven Richardson is a 42 y.o. male, seen on rounds today.  Pt initially presented to the ED for complaints of Medical Clearance Currently, the patient is mildly agitated,  receiving PRN medications.  Physical Exam  BP 132/82 (BP Location: Right Arm)   Pulse 82   Temp 97.8 F (36.6 C) (Oral)   Resp 18   Ht 5\' 11"  (1.803 m)   Wt (!) 162 kg   SpO2 100%   BMI 49.81 kg/m  Physical Exam General: Mildly agitated, redirectable by nursing, not violent Cardiac: well perfused Lungs: even and unlabored Psych: mild agitation  ED Course / MDM  EKG:EKG Interpretation Date/Time:  Thursday September 04 2023 23:32:53 EDT Ventricular Rate:  69 PR Interval:  154 QRS Duration:  86 QT Interval:  394 QTC Calculation: 422 R Axis:   43  Text Interpretation: Undetermined rhythm Otherwise normal ECG No previous ECGs available Confirmed by Vonita Moss [phone]) on 09/05/2023 8:23:02 AM  I have reviewed the labs performed to date as well as medications administered while in observation.  Recent changes in the last 24 hours include pt meets inpatient criteria, mildly agitated this AM, not violent, coming out of his room but redirectable by staff.  Plan  Current plan is for inpatient psychiatric admission. Pt under IVC.    Ernie Avena, MD 09/06/23 1134

## 2023-09-06 NOTE — ED Notes (Signed)
Pt ambulatory out of room and attempting to touch sitter.  Pt redirected but then started picking up objects off the counter stating, "If I touch it, it is mine."  Pt redirected back to room.

## 2023-09-06 NOTE — Progress Notes (Signed)
LCSW Progress Note  [id number]   Steven Richardson  09/06/2023  7:07 PM    Inpatient Behavioral Health Placement  Pt meets inpatient criteria per Shearon Stalls. There are no available beds within [facility] BH system per [facility] AC Rosey Bath, RN. Referral was sent to the following facilities;   Destination  Service Provider Address Phone Fax  [facility]  [address] [place] [address] [phone]  [facility]  [address] [place], [place] [address] [phone]  [facility] Patient Placement  [facility] [place], [place] [phone]  [facility] [address]  [place] [address] [phone]  [facility]  [address] [place] [address] [phone]  [facility]  [address] [place] [address] [phone]  [facility]  [address], [place] [address] [phone]  [facility]  [address] [place] [address] [phone]  [facility]  [address] [place] [address] [phone]  [facility] EFAX  [address] [place], [place] [phone]  [facility]  [address], [place] [address] [phone]  [facility]  [address] [place] [address] [phone]  [facility]  [address] [place] [address] [phone]  [facility]  [address] [place] [address] [phone]  [facility]  [address], [place] [address] [phone]  [facility]  [address] [place] [address] [phone]  [facility] [address]  [address] [place] [address] [phone]  [facility]  [address] [place], [place] [address] [phone]  [facility]  [address] [place] [address] [phone]  [facility]  [address], [place] [address] [phone]  [facility]  [address] [place] [address] [phone]  [facility]  [address], [place] [address] [phone]  [facility]  [address], [place] [address] [phone]  [facility]patient [facility]  [place] [phone]  [facility]  [address] [place] [address] [phone]  [facility]  [address] [place], [place] [address] [phone]  [facility]  [place] [phone]  [facility]  [address], [place] [address] [phone]  [facility]  [address], [place] [address] [phone]    Situation ongoing,  CSW will follow up.    Maryjean Ka, MSW, LCSWA 09/06/2023 7:07 PM

## 2023-09-07 ENCOUNTER — Encounter (HOSPITAL_COMMUNITY): Payer: Self-pay

## 2023-09-07 DIAGNOSIS — F250 Schizoaffective disorder, bipolar type: Secondary | ICD-10-CM | POA: Diagnosis not present
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
## 2023-09-07 NOTE — ED Notes (Signed)
Pt attempted to grab the sitter's R thigh when returning urinal to pt room. Sitter told pt to not grab her as she backed away. [employer] and security in zone and security at doorway

## 2023-09-07 NOTE — ED Notes (Signed)
While this RN and techs still in another room cleaning a pt, this pt pushed Code Blue alarm again.  Other ED staff responded and redirected pt to his bed.

## 2023-09-07 NOTE — ED Notes (Signed)
Pt difficult to get back into room.  Pt trying to get close to male sitter and calling her "baby."  This RN and male sitter stepped between pt and male sitter and were able to redirect pt back to his room.

## 2023-09-07 NOTE — ED Notes (Addendum)
While this RN and tech cleaning another pt, this pt walked in on another pt in restroom despite being told by security that pt was in restroom.  Pt stated, "It is just a woman."  Pt escorted back to room by security.

## 2023-09-07 NOTE — ED Notes (Signed)
Pt continues to be hyper-sexual towards any females.  Pt keeps trying to get close to male sitter on unit and pt yells "Hey baby" to any male that passes his door.
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
## 2023-09-07 NOTE — ED Notes (Signed)
Pt to restroom again; stepped to door of another pt's room.  Pt directed back to room.

## 2023-09-07 NOTE — ED Notes (Signed)
Pt awake again at this time; tried to open door to triage area.  Pt then walked over towards male sitter before walking into restroom.

## 2023-09-07 NOTE — ED Provider Notes (Signed)
Emergency Medicine Observation Re-evaluation Note  Steven Richardson is a 42 y.o. male, seen on rounds today.  Pt initially presented to the ED for complaints of Medical Clearance Currently, the patient is resting in bed.  Physical Exam  BP (!) 145/86 (BP Location: Right Arm)   Pulse 98   Temp 98.4 F (36.9 C) (Oral)   Resp 16   Ht 5\' 11"  (1.803 m)   Wt (!) 162 kg   SpO2 99%   BMI 49.81 kg/m  Physical Exam General: NAD, resting in bed Cardiac: well perfused Lungs: No respiratory distress Psych: Calm in bed  ED Course / MDM  EKG:EKG Interpretation Date/Time:  Thursday September 04 2023 23:32:53 EDT Ventricular Rate:  69 PR Interval:  154 QRS Duration:  86 QT Interval:  394 QTC Calculation: 422 R Axis:   43  Text Interpretation: Undetermined rhythm Otherwise normal ECG No previous ECGs available Confirmed by Vonita Moss [phone]) on 09/05/2023 8:23:02 AM  I have reviewed the labs performed to date as well as medications administered while in observation.  Recent changes in the last 24 hours include pt rounded on by psychiatry, continues to meet inpatient criteria. Has required PRNs for intermittent agitation.   Plan  Current plan is for inpatient psychiatric admission. Pt under IVC.        Ernie Avena, MD 09/07/23 1210

## 2023-09-07 NOTE — ED Notes (Signed)
Pt came out of room, pt redirected by security back to room. Pt not following verbal commands. Pt responding to internal stimuli via talking to self at this time.

## 2023-09-07 NOTE — ED Notes (Signed)
Pt ambulatory from room to bathroom.  Pt then walked out of bathroom with with his penis hanging over the waistband of his pants.  Pt instructed to cover himself and then this RN, sitter, and security redirected pt back to room.  Pt difficult to redirect.  Security able to bring pt back to room.

## 2023-09-07 NOTE — ED Notes (Signed)
Pt urinated in sink in his room with urinal in room within reach.

## 2023-09-07 NOTE — ED Notes (Signed)
Pt urinated on floor in room.  Pt cleaned up urine and staff reiterated that this behavior was not appropriate.

## 2023-09-07 NOTE — Progress Notes (Addendum)
LCSW Progress Note  [id number]   Steven Richardson  09/07/2023  6:10 PM    Inpatient Behavioral Health Placement  Pt meets inpatient criteria per Shearon Stalls. There are no available beds within [facility] system per [facility] Antoinette Cillo, RN. Referral was sent to the following facilities;    Destination  Service Provider Address Phone Fax  [facility]  [address], [place] [address] [phone]  [facility]  [address] [place], [place] [address] [phone]  [facility] Patient Placement  [facility] [place], [place] [phone]  [facility] [address]  [place] [address] [phone]  [facility]  [address] [place] [address] [phone]  [facility]  [address] [place] [address] [phone]  [facility]  [address] [place] [address] [phone]  [facility]  [address], [place] [address] [phone]  [facility]  [address] [place] [address] [phone]  [facility] EFAX  [address] [place], [place] [phone]  [facility]  [address], [place] [address] [phone]  [facility]  [address] [place] [address] [phone]  [facility]  [address] [place] [address] [phone]  [facility]  [address] [place] [address] [phone]  [facility]  [address] [place] [address] [phone]  [facility]  [address] [place] [address] [phone]  [facility] [address]  [address], [place] [address] [phone]  [facility]  [address] [place], [place] [address] [phone]  [facility]  [address] [place] [address] [phone]  [facility]  [address], [place] [address] [phone]  [facility]  [address], [place] [address] [phone]  [facility]  [address], [place] [address] [phone]  [facility]  [address], [place] [address] [phone]  [facility]iat[facility] Inpatient [facility]  [place] [phone]  [facility]  [address], [place] [address] [phone]  [facility]  [address] [place], [place] [address] [phone]  [facility]  [place] [phone]  [facility]  [address] [place] [address] [phone]  [facility]  [address] [place] [address] [phone]     Situation ongoing,  CSW will follow up.    Maryjean Ka, MSW, LCSWA 09/07/2023 6:04 PM

## 2023-09-07 NOTE — ED Notes (Signed)
Pt pushed Code Blue button on wall before walking to restroom.  Staff once again told pt to not hit Code Blue button.  Pt somewhat difficult to direct back to room following restroom.

## 2023-09-07 NOTE — ED Notes (Signed)
Pt continues to come out of room and is difficult to redirect.  Pt walking to doors of other rooms.  This RN to administer ordered PRN medications.
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
## 2023-09-07 NOTE — ED Notes (Signed)
Pt came to doorway and pulled his genitals out of his pants. Pt then put his genitals back in his pants. Pt then came out of room and hopped on another bed and laid down. Pt assisted off of the bed and assisted back into his room via [employer] and security.

## 2023-09-08 DIAGNOSIS — F250 Schizoaffective disorder, bipolar type: Secondary | ICD-10-CM | POA: Diagnosis not present

## 2023-09-08 LAB — POTASSIUM: Potassium: 4.2 mmol/L (ref 3.5–5.1)

## 2023-09-08 NOTE — Progress Notes (Signed)
Patient has been denied by [facility] due to no appropriate beds available. Patient meets BH inpatient criteria per Alona Bene, NP. Patient has been faxed out to the following facilities:   [facility]  [address], [place] [address] [phone]  [facility]  [address] [place], [place] [address] [phone]  [facility] Patient Placement  [facility] [place], [place] [phone]  [facility] [address]  [place] [address] [phone]  [facility]  [address] [place] [address] [phone]  [facility]  [address] [place] [address] [phone]  [facility]  [address], [place] [address] [phone]  [facility]  [address] [place] [address] [phone]  [facility]  [address] [place] [address] [phone]  [facility]  [address] [place], [place] [phone]  [facility]  [address], [place] [address] [phone]  [facility]  [address] [place] [address] [phone]  [facility]  [address] [place] [address] [phone]  [facility]  [address] [place] [address] [phone]  [facility]  [address] [place] [address] [phone]  [facility]  [address] [place] [address] [phone]  [facility] [address]  [address] [place] [address] [phone]  [facility]  [address] [place], [place] [address] [phone]  [facility]  [address] [place] [address] [phone]  [facility]  [address], [place] [address] [phone]  [facility]  [address] [place] [address] [phone]  [facility]  [address] [place] [address] [phone]  [facility]  [address], [place] [address] [phone]  [facility]  [place] [phone]  [facility]  [address] [place] [address] [phone]  [facility]  [address] [place], [place] [address] [phone]  [facility]  [place] [phone]  [facility]  [address], [place] [address] [phone]  [facility]  [address] [place] [address] [phone]   Damita Dunnings, MSW, LCSW-A  10:08 PM 09/08/2023

## 2023-09-08 NOTE — ED Notes (Signed)
Pt exited room requesting water. Pt had removed pants. Pt instructed to put his pants back on and provided water. Pt then tried to remove his shirt. Pt instructed to return to bed by [employer].

## 2023-09-08 NOTE — ED Provider Notes (Signed)
  Physical Exam  BP 120/83 (BP Location: Right Arm)   Pulse 92   Temp 98 F (36.7 C) (Oral)   Resp 18   Ht 5\' 11"  (1.803 m)   Wt (!) 162 kg   SpO2 99%   BMI 49.81 kg/m   Physical Exam  Procedures  Procedures  ED Course / MDM   Clinical Course as of 09/08/23 0824  Thu Sep 04, 2023  2032 ECG reviewed interpreted and within normal limits Alcohol level is negative Complete metabolic panel reviewed interpreted send for mild hypokalemia with potassium of 3.2 Urine drug screen has not been collected [DR]    Clinical Course User Index [DR] Margarita Grizzle, MD   Medical Decision Making Amount and/or Complexity of Data Reviewed Labs: ordered.  Risk Prescription drug management.   Patient is pending inpatient psychiatric treatment.  Resting comfortably in room.       Benjiman Core, MD 09/08/23 [phone]

## 2023-09-08 NOTE — Progress Notes (Signed)
LCSW Progress Note  [id number]   Steven Richardson  09/08/2023  12:38 PM  Description:   Inpatient Psychiatric Referral  Patient was recommended inpatient per [facility], PMHNP. There are no [facility] beds at [facility], per [facility] Rona Ravens, RN. Patient was referred to the following out of network facilities:   Radiation protection practitioner Fax  [facility] [place]  [place] [address] [phone]  [facility]  [address] [place] [address] [phone]  [facility]  [address] [place] [address] [phone]  [facility]  [address] [place] [address] [phone]  [facility]  [address], [place] [address] [phone]  [facility]  [address] [place] [address] [phone]  [facility]  [address] [place], [place] [phone]  [facility]  [address], [place] [address] [phone]  [facility]  [address] [place] [address] [phone]  [facility]  [address], [place] [address] [phone]  [facility]  [address], [place] [address] [phone]  [facility]  [address] [place] [address] [phone]  [facility] [address]  [address], [place] [address] [phone]  [facility]  [address] [place], [place] [address] [phone]  [facility]  [address] [place] [address] [phone]  [facility]  [address], [place] [address] [phone]  [facility] Inpatient E[facility]  [place] [phone]  [facility]  [address] [place] [address] [phone]  [facility]  [place] [phone]    Situation ongoing, CSW to continue following and update chart as more information becomes available.      Cathie Beams, MSW, LCSW  09/08/2023 12:38 PM

## 2023-09-08 NOTE — Progress Notes (Signed)
[facility] ED Progress Note  09/08/2023 5:46 PM Steven Richardson  MRN:  [id number]   Principal Problem: Schizoaffective disorder, bipolar type (HCC) Diagnosis:  Principal Problem:   Schizoaffective disorder, bipolar type [facility])   ED Assessment Time Calculation: Start Time: 1100 Stop Time: 1120 Total Time in Minutes (Assessment Completion): 20  Subjective:  During evaluation Steven Richardson is laying in his bed, talking loudly to staff. He is alert, oriented x 2, anxious, cooperative, with inattentiveness. His mood is excited with congruent affect. He has pressured speech, with flights of ideas and disorganized thought patterns. Objectively, patient appears manic and disorganized although he doesn't appear to be responding to internal stimuli. Patient is unable to reliably contract for safety and continues to meet criteria for inpatient psychiatric treatment for medication stabilization and safety. He denies suicidal ideations. ?He denies homicidal ideations. Appetite is good and sleep is fair, as patient is restless, and has to be given medication to help with sleep. ?   Past Psychiatric History: Schizoaffective disorder, bipolar type (HCC)   [place] Scale:  Flowsheet Row ED from 09[facility]04/2023 in [facility] at [facility] Most recent reading at 09/08/2023  7:55 AM ED from 09/04/2023 in [facility] Most recent reading at 09/04/2023  3:57 PM ED from 12/02/2021 in [facility] at [facility] Most recent reading at 12/02/2021  8:52 PM  C-SSRS RISK CATEGORY No Risk No Risk No Risk       Past Medical History:  Past Medical History:  Diagnosis Date   Constipation    Diabetes mellitus without complication (HCC)    Paranoid schizophrenia (HCC)     Past Surgical History:  Procedure Laterality Date   NO PAST SURGERIES     Family History:  Family History  Problem Relation Age of Onset   Healthy Mother    Diabetes  Mother    Healthy Father    Colon cancer Neg Hx    Esophageal cancer Neg Hx    Stomach cancer Neg Hx    Pancreatic cancer Neg Hx     Social History:  Social History   Substance and Sexual Activity  Alcohol Use No     Social History   Substance and Sexual Activity  Drug Use No    Social History   Socioeconomic History   Marital status: Single    Spouse name: Not on file   Number of children: Not on file   Years of education: Not on file   Highest education level: Not on file  Occupational History   Not on file  Tobacco Use   Smoking status: Every Day    Types: Cigarettes   Smokeless tobacco: Never  Vaping Use   Vaping status: Never Used  Substance and Sexual Activity   Alcohol use: No   Drug use: No   Sexual activity: Yes  Other Topics Concern   Not on file  Social History Narrative   Not on file   Social Determinants of Health   Financial Resource Strain: Medium Risk (12/27/2022)   Received from [facility]   Overall Financial Resource Strain (CARDIA)    Difficulty of Paying Living Expenses: Somewhat hard  Food Insecurity: No Food Insecurity (05/04/2023)   [facility] from [facility]   Hunger Vital Sign    Worried About Running Out of Food in the Last Year: Never true    Ran Out of Food in the Last Year: Never true  Transportation Needs: No  Transportation Needs (05/04/2023)   Received from [facility] - Transportation    Lack of Transportation (Medical): No    Lack of Transportation (Non-Medical): No  Physical Activity: Insufficiently Active (02/13/2022)   [facility]   Exercise Vital Sign    Days of Exercise per Week: 3 days    Minutes of Exercise per Session: 30 min  Stress: Stress Concern Present (05/04/2023)   [facility] - Occupational Stress Questionnaire    Feeling of Stress : Very much  Social Connections: Unknown (02/14/2023)   [facility]    Social Network    Social Network: Not on file    Sleep: Fair  Appetite:  Fair  Current Medications: Current Facility-Administered Medications  Medication Dose Route Frequency Provider Last Rate Last Admin   cephALEXin (KEFLEX) capsule 500 mg  500 mg Oral BID Rondel Baton, MD   500 mg at 09/08/23 0902   diphenhydrAMINE (BENADRYL) capsule 50 mg  50 mg Oral Q6H PRN Lenox Ponds, NP   50 mg at 09/08/23 1610   Or   diphenhydrAMINE (BENADRYL) injection 50 mg  50 mg Intramuscular Q6H PRN Lenox Ponds, NP   50 mg at 09/07/23 9604   haloperidol (HALDOL) tablet 10 mg  10 mg Oral BID Cathren Laine, MD   10 mg at 09/08/23 0902   haloperidol (HALDOL) tablet 5 mg  5 mg Oral Q6H PRN Lenox Ponds, NP   5 mg at 09/08/23 1220   Or   haloperidol lactate (HALDOL) injection 5 mg  5 mg Intramuscular Q6H PRN Lenox Ponds, NP   5 mg at 09/07/23 0811   hydrOXYzine (ATARAX) tablet 25 mg  25 mg Oral Q6H PRN Cathren Laine, MD   25 mg at 09/08/23 1109   lithium carbonate (ESKALITH) ER tablet 900 mg  900 mg Oral Q12H Cathren Laine, MD   900 mg at 09/08/23 0902   LORazepam (ATIVAN) tablet 2 mg  2 mg Oral Q6H PRN Lenox Ponds, NP   2 mg at 09/08/23 1109   Or   LORazepam (ATIVAN) injection 2 mg  2 mg Intramuscular Q6H PRN Lenox Ponds, NP   2 mg at 09/07/23 5409   traZODone (DESYREL) tablet 50 mg  50 mg Oral QHS Cathren Laine, MD   50 mg at 09/07/23 2108   Current Outpatient Medications  Medication Sig Dispense Refill   hydrochlorothiazide (HYDRODIURIL) 25 MG tablet Take 25 mg by mouth in the morning.     OLANZapine (ZYPREXA) 15 MG tablet Take 15 mg by mouth in the morning and at bedtime.     benztropine (COGENTIN) 1 MG tablet Take 1 tablet (1 mg total) by mouth 2 (two) times daily. (Patient not taking: Reported on 09/05/2023) 60 tablet 0   divalproex (DEPAKOTE ER) 500 MG 24 hr tablet Take 2 tablets (1,000 mg total) by mouth 2 (two) times daily. (Patient not taking: Reported on 09/05/2023)  120 tablet 0   doxycycline (VIBRAMYCIN) 100 MG capsule Take 1 capsule (100 mg total) by mouth 2 (two) times daily. (Patient not taking: Reported on 09/05/2023) 14 capsule 0   fluconazole (DIFLUCAN) 200 MG tablet Take 200 mg by mouth once a week. For 4 weeks. (Patient not taking: Reported on 09/05/2023)     lithium carbonate (LITHOBID) 300 MG CR tablet Take one tablet (300 mg) by mouth every morning and two tablets (600 mg)  by mouth at bedtime. (Patient not taking: Reported on 09/05/2023) 90 tablet 0   propranolol (INDERAL) 20 MG tablet Take 1 tablet (20 mg total) by mouth 2 (two) times daily. (Patient not taking: Reported on 09/05/2023) 60 tablet 0   QUEtiapine (SEROQUEL) 400 MG tablet Take 1 tablet (400 mg total) by mouth at bedtime for 14 days. (Patient not taking: Reported on 09/05/2023) 14 tablet 0   QUEtiapine (SEROQUEL) 50 MG tablet Take 1 tablet (50 mg total) by mouth daily. (Patient not taking: Reported on 09/05/2023) 30 tablet 0   risperiDONE (RISPERDAL) 2 MG tablet Take 1.5 tablets (3 mg total) by mouth at bedtime. (Patient not taking: Reported on 09/05/2023) 45 tablet 0    Lab Results:  Results for orders placed or performed during the hospital encounter of 09/04/23 (from the past 48 hour(s))  Potassium     Status: None   Collection Time: 09/08/23  9:32 AM  Result Value Ref Range   Potassium 4.2 3.5 - 5.1 mmol/L    Comment: Performed at [facility], [address], [place], [place] [address]    Blood Alcohol level:  Lab Results  Component Value Date   [facility] <10 09/04/2023   ETH <10 03/18/2020    Physical Findings:  CIWA:    COWS:     Musculoskeletal: Strength & Muscle Tone: within normal limits Gait & Station: normal Patient leans: N/A  Psychiatric Specialty Exam:  Presentation  General Appearance:  Disheveled  Eye Contact: Fleeting  Speech: Pressured  Speech Volume: Normal  Handedness: Right   Mood and Affect   Mood: Euphoric  Affect: Inappropriate   Thought Process  Thought Processes: Disorganized  Descriptions of Associations:Loose  Orientation:Partial  Thought Content:Scattered  History of Schizophrenia/Schizoaffective disorder:No data recorded Duration of Psychotic Symptoms:No data recorded Hallucinations:Hallucinations: Other (comment) (unable to assess)  Ideas of Reference:None  Suicidal Thoughts:Suicidal Thoughts: No  Homicidal Thoughts:Homicidal Thoughts: No   Sensorium  Memory: Immediate Poor; Recent Poor  Judgment: Impaired  Insight: None   Executive Functions  Concentration: Poor  Attention Span: Poor  Recall: Poor  Fund of Knowledge: Poor  Language: Poor   Psychomotor Activity  Psychomotor Activity: Psychomotor Activity: Increased   Assets  Assets: Communication Skills; Desire for Improvement; Physical Health   Sleep  Sleep: Sleep: Poor    Physical Exam: Physical Exam Vitals and nursing note reviewed. Exam conducted with a chaperone present.  Neurological:     Mental Status: He is alert.  Psychiatric:        Attention and Perception: He is inattentive.        Mood and Affect: Mood is anxious. Affect is inappropriate.        Speech: Speech is rapid and pressured.        Behavior: Behavior is hyperactive.        Thought Content: Thought content is delusional.        Cognition and Memory: Cognition is impaired.        Judgment: Judgment is inappropriate.    Review of Systems  Psychiatric/Behavioral:         Delusional    Blood pressure 123/82, pulse 88, temperature 98.6 F (37 C), temperature source Oral, resp. rate 16, height 5\' 11"  (1.803 m), weight (!) 162 kg, SpO2 96%. Body mass index is 49.81 kg/m.    Medical Decision Making: -Continue seeking of disposition for inpatient hospitalization for mental health -Continue current medications -Continue medications for agitation/increased psychosis (with modications  and addition of  Haldol)    Azriella Mattia MOTLEY-MANGRUM, PMHNP 09/08/2023, 5:46 PM

## 2023-09-08 NOTE — ED Notes (Signed)
Pt standing on bed masturbating. Pt instructed to stop and lay down on his bed.

## 2023-09-08 NOTE — ED Notes (Signed)
Pt exited room and approached nurses station. Attempted to redirect pt back to room. Pt continued to walk towards nurse and tech. Pt stated "touch me and I will", pt continued to requested male staff touch him to guide him back to room. Pt redirected into room and instructed to remain laying down.

## 2023-09-08 NOTE — ED Notes (Signed)
Pt took a shower and put on fresh paper scrubs.

## 2023-09-08 NOTE — ED Notes (Signed)
Pt awake and exited room. Pt began rolling tray table around in circle outside room. Tray table removed by [facility] officer and pt redirected back to room. Pt resting on bed at this time.

## 2023-09-08 NOTE — ED Notes (Signed)
Patient observer arrived for pt. Report given to observer. Pt sleeping at this time. Symmetrical chest rise & fall noted. No needs identified at this time.

## 2023-09-08 NOTE — ED Notes (Signed)
Staffing coordinator contacted to request patient observer due to pt IVC status. None available at this time per staffing.

## 2023-09-08 NOTE — ED Notes (Signed)
Pt becoming more restless and agitated. Pt continuously rambling in [place] and Arabic. Tangential thinking noted. Pt exited room and asked to use restroom. Pt directed to use urinal in room. Directed back to room by [employer]. Pt used urinal and then poured urine in sink. Pt provided with PRN medication.

## 2023-09-08 NOTE — ED Notes (Signed)
Patient walked to the bathroom and bent over the toilet and started drinking the water. Patient redirected to his room; Pt has also been drinking his urine from the urinal tonight; urinal placed outside the door if patient needs to use it.-Monique,RN

## 2023-09-08 NOTE — ED Notes (Signed)
Pt observed in bathroom drinking water from the toilet. Safety tech and security officers directed pt back to his room. Pt is following commands.

## 2023-09-08 NOTE — ED Notes (Signed)
Pt leaving room again and requesting to use the RR. Pt advised that he cannot wonder around and that he has a urinal in his room. Pt is redirectable by this Clinical research associate and [employer].

## 2023-09-09 DIAGNOSIS — F250 Schizoaffective disorder, bipolar type: Secondary | ICD-10-CM | POA: Diagnosis not present

## 2023-09-09 LAB — LITHIUM LEVEL: Lithium Lvl: 0.85 mmol/L (ref 0.60–1.20)

## 2023-09-09 MED ORDER — HALOPERIDOL LACTATE 5 MG/ML IJ SOLN: 5.0000 mg | Freq: Once | INTRAMUSCULAR | Status: DC

## 2023-09-09 NOTE — ED Notes (Signed)
Patient is throwing things at the sitter.

## 2023-09-09 NOTE — ED Provider Notes (Signed)
Emergency Medicine Observation Re-evaluation Note  Steven Richardson is a 42 y.o. male, seen on rounds today.  Pt initially presented to the ED for complaints of Medical Clearance Currently, the patient is Lying in bed resting.  Physical Exam  BP 112/75 (BP Location: Right Arm)   Pulse 75   Temp 97.9 F (36.6 C) (Oral)   Resp 19   Ht 5\' 11"  (1.803 m)   Wt (!) 162 kg   SpO2 98%   BMI 49.81 kg/m  Physical Exam General: Lying in bed Cardiac: well perfused Lungs: no resp distress Psych: Calm  ED Course / MDM  EKG:EKG Interpretation Date/Time:  Thursday September 04 2023 23:32:53 EDT Ventricular Rate:  69 PR Interval:  154 QRS Duration:  86 QT Interval:  394 QTC Calculation: 422 R Axis:   43  Text Interpretation: Undetermined rhythm Otherwise normal ECG No previous ECGs available Confirmed by Vonita Moss [phone]) on 09/05/2023 8:23:02 AM  I have reviewed the labs performed to date as well as medications administered while in observation.  Recent changes in the last 24 hours include intermittent agitation, blankets had to be removed from room because patient was wrapping them around his neck.  2:09 PM Patient is agitated, threw the table in his room. Throwing his sandal around the room. Will give haldol 5 mg IM.  Plan  Current plan is for inpatient admission.    Loetta Rough, MD 09/09/23 [phone]

## 2023-09-09 NOTE — Progress Notes (Signed)
Pt threw flip flops at sitter

## 2023-09-09 NOTE — Progress Notes (Signed)
Pt continues to drink out of the toilet against staff and sitter telling him not to

## 2023-09-09 NOTE — ED Notes (Signed)
Patient is urinating on the floor in is room and continue to pull the code button and refuse to listen.

## 2023-09-09 NOTE — ED Notes (Signed)
IVC'd 09/04/23, expires 09/11/23; docs in purple zone

## 2023-09-09 NOTE — ED Notes (Signed)
Sitter informed RN that patient has blanket tied around  his neck; RN heard sitter request patient to remove blanket several times; RN removed all blankets from room; RN explained to patient that blankets are a safety risk at this time and we will have to review when he is able to have it back-Monique,RN

## 2023-09-09 NOTE — Progress Notes (Signed)
Pt being sexual inappropriate

## 2023-09-09 NOTE — Progress Notes (Signed)
Pt urinated on the floor in his room nurse aware

## 2023-09-09 NOTE — Progress Notes (Signed)
LCSW Progress Note  [id number]   Syris Brookens  09/09/2023  10:41 AM  Description:   Inpatient Psychiatric Referral  Patient was recommended inpatient per Eligha Bridegroom, NP. There are no available beds at [facility], per [facility] Rona Ravens, RN. Patient was referred to the following out of network facilities:   Destination  Service Provider Address Phone Fax  [facility]  [address] [place], [place] [address] [phone]  [facility] [address]  [place] [address] [phone]  [facility]  [address] [place] [address] [phone]  [facility]  [address] [place] [address] [phone]  [facility]  [address] [place] [address] [phone]  [facility]  [address], [place] [address] [phone]  [facility] EFAX  [address] [place], [place] [phone]  [facility]  [address], [place] [address] [phone]  [facility]  [address] [place] [address] [phone]  [facility]  [address], [place] [address] [phone]  [facility]  [address] [place] [address] [phone]  [facility]  [address] [place] [address] [phone]  [facility] [address]  [address], [place] [address] [phone]  [facility]  [address] [place], [place] [address] [phone]  [facility]  [address] [place] [address] [phone]  [facility]  [address], [place] [address]  [phone]  [facility]  [place] [phone]  [facility]  [address] [place] [address] [phone]  [facility]  [address] [place], [place] [address] [phone]  [facility]  [place] [phone]    Situation ongoing, CSW to continue following and update chart as more information becomes available.    Cathie Beams, MSW, LCSW  09/09/2023 10:41 AM

## 2023-09-10 ENCOUNTER — Encounter (HOSPITAL_COMMUNITY): Payer: Self-pay

## 2023-09-10 DIAGNOSIS — F250 Schizoaffective disorder, bipolar type: Secondary | ICD-10-CM | POA: Diagnosis not present

## 2023-09-10 NOTE — ED Notes (Signed)
This RN attempted to verbally redirect pt back to his room and pt told this RN that "You can kiss my ass and suck my dick." [employer] redirected pt back to his room. Pt can be heard laughing inappropriately in his room.

## 2023-09-10 NOTE — Progress Notes (Signed)
LCSW Progress Note  [id number]   Steven Richardson  09/10/2023  12:59 PM  Description:   Inpatient Psychiatric Referral  Patient was recommended inpatient per [name], NP. There are no available beds at [facility], per [facility] Rona Ravens, RN. Patient was referred to the following out of network facilities:   Production assistant, radio Address Phone Fax  [facility]  [address] [place], [place] [address] [phone]  [facility]  [address] [place] [address] [phone]  [facility]  [address] [place] [address] [phone]  [facility]  [address] [place] [address] [phone]  [facility]  [address], [place] [address] [phone]  [facility] EFAX  [address] [place], [place] [phone]  [facility]  [address] [place], [place] [address] [phone]  [facility]  [address] [place] [address] [phone]  [facility]  [address], [place] [address] [phone]  [facility]  [address] [place] [address] [phone]  [facility]  [address] [place] [address] [phone]  [facility] [address]  [address] [place] [address] [phone]  [facility]  [address] [place], [place] [address] [phone]  [facility]  [address] [place] [address] [phone]  [facility]  [address] [place] [address] [phone]  [facility] Psychiatry Inpatient EFAX  [place]  [phone]  [facility]  [address] [place] [address] [phone]  [facility]  [place] [phone]    Situation ongoing, CSW to continue following and update chart as more information becomes available.    Cathie Beams, MSW, LCSW  09/10/2023 12:59 PM

## 2023-09-10 NOTE — ED Provider Notes (Signed)
Emergency Medicine Observation Re-evaluation Note  Steven Richardson is a 42 y.o. male, seen on rounds today.  Pt initially presented to the ED for complaints of Medical Clearance Currently, the patient is sitting in his bed resting.  Physical Exam  BP 125/76 (BP Location: Right Arm)   Pulse 81   Temp 97.6 F (36.4 C) (Oral)   Resp 16   Ht 5\' 11"  (1.803 m)   Wt (!) 162 kg   SpO2 96%   BMI 49.81 kg/m  Physical Exam General: No acute agitation Cardiac: No murmur on my exam Lungs: Clear bilaterally Psych: No acute agitation at this time  ED Course / MDM  EKG:EKG Interpretation Date/Time:  Thursday September 04 2023 23:32:53 EDT Ventricular Rate:  69 PR Interval:  154 QRS Duration:  86 QT Interval:  394 QTC Calculation: 422 R Axis:   43  Text Interpretation: Undetermined rhythm Otherwise normal ECG No previous ECGs available Confirmed by Vonita Moss [phone]) on 09/05/2023 8:23:02 AM  I have reviewed the labs performed to date as well as medications administered while in observation.  Recent changes in the last 24 hours include none reported by nursing.  Plan  Current plan is for awaiting inpatient placement per last note.    Srija Southard, [place]ary [place], MD 09/10/23 1116

## 2023-09-10 NOTE — ED Notes (Addendum)
Pt loud and laughing inappropriately, responding to internal stimuli. Pt w/ inappropriate sexual comments towards staff.
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
## 2023-09-10 NOTE — ED Notes (Signed)
D/t pt misusing bathroom and drinking out of the toilet, pt was given a bedside commode for his room. Pt then defecated in the floor instead of in the bedside commode. Pt then started walking up on a sitter while wiping his hands w/ a washcloth. Pt was redirected by security and [employer] to go back to his room. Pt then threw the wash cloth in the floor. Explained to pt that was inappropriate and is to not be throwing things in the floor towards staff.

## 2023-09-11 ENCOUNTER — Encounter (HOSPITAL_COMMUNITY): Payer: Self-pay

## 2023-09-11 DIAGNOSIS — F250 Schizoaffective disorder, bipolar type: Secondary | ICD-10-CM | POA: Diagnosis not present

## 2023-09-11 NOTE — ED Notes (Signed)
Care assumed, pt calm at this time. Sitter at bedside.

## 2023-09-11 NOTE — ED Notes (Signed)
Patient peeing in the sink and eating toilet paper; Pt standing in room with now pants on; [employer] at bedside for support; Toilet paper removed from patient room-Monique,RN

## 2023-09-11 NOTE — ED Notes (Signed)
Pt came to door requesting more food and drink, also c/o that his leg is itching. Pt redirected back to his room and told he will be given food/drink with his meds at 2200.

## 2023-09-11 NOTE — ED Notes (Signed)
This RN spoke with Karel Jarvis from [facility] about potential placement.

## 2023-09-11 NOTE — ED Notes (Signed)
IVC paperwork completed. Original in red folder, 3 copies attached to clipboard in purple, and 1 copy in medical records.  Case number: [id number] Exp: 10-17

## 2023-09-11 NOTE — ED Provider Notes (Signed)
Emergency Medicine Observation Re-evaluation Note  Steven Richardson is a 42 y.o. male, seen on rounds today.  Pt initially presented to the ED for complaints of Medical Clearance Currently, the patient is resting.  Physical Exam  BP 100/75 (BP Location: Left Arm)   Pulse 71   Temp 98.2 F (36.8 C) (Oral)   Resp 18   Ht 5\' 11"  (1.803 m)   Wt (!) 162 kg   SpO2 93%   BMI 49.81 kg/m  Physical Exam General: nad  Lungs: no resp distress Psych: calm  ED Course / MDM  EKG:EKG Interpretation Date/Time:  Thursday September 04 2023 23:32:53 EDT Ventricular Rate:  69 PR Interval:  154 QRS Duration:  86 QT Interval:  394 QTC Calculation: 422 R Axis:   43  Text Interpretation: Undetermined rhythm Otherwise normal ECG No previous ECGs available Confirmed by Vonita Moss [phone]) on 09/05/2023 8:23:02 AM  I have reviewed the labs performed to date as well as medications administered while in observation.  Recent changes in the last 24 hours include reported disorganized behavior, inappropriate sexual behavior, drinking toilet water .  Plan  Current plan is for placement.    Sloan Leiter, DO 09/11/23 [phone]

## 2023-09-11 NOTE — Progress Notes (Signed)
Pt has been denied by [facility], and [facility] due to hyper sexual behavior and high acuity. CSW will continue to assist with placement.  Cathie Beams, MSW, LCSW  09/11/2023 3:31 PM

## 2023-09-11 NOTE — ED Notes (Signed)
Pt urinated in the floor of his room, tracked it out to the copy machine and threw his urine drenched socks at the base of the copy machine. Pt provided w/ towels to clean up the mess. Pt had urinal in his room that he is aware that he is to use.

## 2023-09-11 NOTE — ED Notes (Signed)
While this RN was assisting with another pt, staff informed me that pt was calling one of the sitter's a bitch.

## 2023-09-11 NOTE — ED Notes (Addendum)
Patient noted to be pulling pants down by Engineer, materials. Security officer encouraged patient to pull pants back up to remain covered. Patient non compliant at this time.

## 2023-09-11 NOTE — Progress Notes (Signed)
LCSW Progress Note  [id number]   Amato Sevillano  09/11/2023  1:37 PM  Description:   Inpatient Psychiatric Referral  Patient was recommended inpatient per [name], NP. There are no available beds at [facility], per [facility], RN. Patient was referred to the following out of network facilities:   Production assistant, radio Address Phone Fax  [facility]  [address] [place] [address] [phone]  [facility] [address] [place]  [address] [place], [place] [address] [phone]  [facility]  [address] [place] [address] [phone]  [facility]  [address] [place] [address] [phone]  [facility]  [address], [place] [address] [phone]  [facility]  [address], [place] [address] [phone]  [facility] EFAX  [address] [place], [place] [phone]  [facility]  [address], [place] [address] [phone]  [facility]  [address] [place] [address] [phone]  [facility]  [address] [place] [address] [phone]  [facility]  [address] [place] [address] [phone]  [facility]  [address] [place] [address] [phone]  [facility] [address]  [address] [place] [address] [phone]  [facility]  [address] [place], [place] [address] [phone]  [facility]  [address] [place] [address] [phone]  [facility]  [address]  [place] [address] [phone]  [facility] EFAX  [place] [phone]  [facility]  [address] [place] [address] [phone]  [facility]  [place] [phone]    Situation ongoing, CSW to continue following and update chart as more information becomes available.    Cathie Beams, MSW, LCSW  09/11/2023 1:37 PM

## 2023-09-11 NOTE — ED Notes (Signed)
Pt came out of his room talking in disorganized manner. Pt asked to please go back into his room. Pt said yes ma'am and went back in his room w/o any incident.

## 2023-09-11 NOTE — ED Notes (Signed)
Pt w/ loud speech, inappropriate laughing, making sexual gestures to another pt. Pt redirected back to his room. While other RN was administering meds, pt was hypersexual and demanding for RN to put the pills in his mouth. RN redirected pt to take the pills himself. Pt then attempted to eat the medicine cup. Medicine cup removed from pt possession.

## 2023-09-11 NOTE — ED Notes (Signed)
Pt came to doorway talking about shampoo. Security explained to pt that he has to stay in his room. Pt redirected back into his room.

## 2023-09-11 NOTE — ED Notes (Signed)
Pt in room talking incoherently to himself and laughing inappropriately. Night meds given.
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
## 2023-09-12 DIAGNOSIS — F250 Schizoaffective disorder, bipolar type: Secondary | ICD-10-CM | POA: Diagnosis not present

## 2023-09-12 NOTE — ED Provider Notes (Signed)
Emergency Medicine Observation Re-evaluation Note  Normal Kilpatrick is a 42 y.o. male, seen on rounds today.  Pt initially presented to the ED for complaints of Medical Clearance Currently, the patient is drinking coffee standing in the doorway waiting for his shower.  Physical Exam  BP 100/67 (BP Location: Left Arm)   Pulse 64   Temp 97.9 F (36.6 C) (Oral)   Resp 16   Ht 5\' 11"  (1.803 m)   Wt 73.5 kg   SpO2 100%   BMI 22.59 kg/m  Physical Exam General: nad Cardiac: regular Lungs: clear and not labored Psych: cooerative  ED Course / MDM  EKG:EKG Interpretation Date/Time:  Thursday September 04 2023 23:32:53 EDT Ventricular Rate:  69 PR Interval:  154 QRS Duration:  86 QT Interval:  394 QTC Calculation: 422 R Axis:   43  Text Interpretation: Undetermined rhythm Otherwise normal ECG No previous ECGs available Confirmed by Vonita Moss [phone]) on 09/05/2023 8:23:02 AM  I have reviewed the labs performed to date as well as medications administered while in observation.  Recent changes in the last 24 hours include none.  Plan  Current plan is for pt continues to wait for placement due to hypersexual tendencies he is still waiting for suitable placement.    Gwyneth Sprout, MD 09/12/23 [phone]

## 2023-09-12 NOTE — ED Notes (Addendum)
Pt awake, calm and cooperative.

## 2023-09-12 NOTE — Progress Notes (Signed)
This CSW completed chart review and pt has been denied for inpatient BH placement by following: [facility].  -Pt continues to meet inpatient BH placement per Shearon Stalls.  This CSW completed referral to [facility] and sent documents to [facility] for review. CSW/Disposition team will assist and follow with placement.   Maryjean Ka, MSW, LCSWA 09/12/2023 11:17 PM

## 2023-09-12 NOTE — ED Notes (Signed)
Patient up to shower.

## 2023-09-13 DIAGNOSIS — F250 Schizoaffective disorder, bipolar type: Secondary | ICD-10-CM | POA: Diagnosis not present

## 2023-09-13 MED ORDER — QUETIAPINE FUMARATE 50 MG PO TABS
50.0000 mg | ORAL_TABLET | Freq: Once | ORAL | Status: AC
  Administered 2023-09-13: 50 mg via ORAL
  Filled 2023-09-13: qty 1

## 2023-09-13 MED ORDER — QUETIAPINE FUMARATE 25 MG PO TABS
50.0000 mg | ORAL_TABLET | Freq: Every day | ORAL | Status: DC
  Administered 2023-09-13 – 2023-09-15 (×3): 50 mg via ORAL
  Filled 2023-09-13 (×3): qty 1

## 2023-09-13 MED ORDER — TRAZODONE HCL 50 MG PO TABS
50.0000 mg | ORAL_TABLET | Freq: Every evening | ORAL | Status: DC | PRN
  Administered 2023-09-13: 50 mg via ORAL
  Filled 2023-09-13 (×2): qty 1

## 2023-09-13 NOTE — Progress Notes (Signed)
Per [facility]) Intake pt is now under review to the waitlist. PT IS NOT ON THE WAITLIST BUT UNDER REVIEW. CSW/ Disposition team will assist and follow with [facility] placement.   Maryjean Ka, MSW, [facility] 09/13/2023 4:45 PM

## 2023-09-13 NOTE — ED Provider Notes (Signed)
Emergency Medicine Observation Re-evaluation Note  Steven Richardson is a 42 y.o. male, seen on rounds today.  Pt initially presented to the ED for complaints of Medical Clearance Currently, the patient is sitting upright eating lunch.  Physical Exam  BP 116/69 (BP Location: Right Arm)   Pulse 94   Temp 98 F (36.7 C) (Oral)   Resp 17   Ht 5\' 11"  (1.803 m)   Wt 73.5 kg   SpO2 98%   BMI 22.59 kg/m  Physical Exam General: Distress Cardiac: Regular rate and rhythm Lungs: Increased work of breathing Psych: Quiet  ED Course / MDM  EKG:EKG Interpretation Date/Time:  Thursday September 04 2023 23:32:53 EDT Ventricular Rate:  69 PR Interval:  154 QRS Duration:  86 QT Interval:  394 QTC Calculation: 422 R Axis:   43  Text Interpretation: Undetermined rhythm Otherwise normal ECG No previous ECGs available Confirmed by Vonita Moss [phone]) on 09/05/2023 8:23:02 AM  I have reviewed the labs performed to date as well as medications administered while in observation.  Recent changes in the last 24 hours include 1 outburst.  Plan  Current plan is for placement.    Gerhard Munch, MD 09/13/23 1425

## 2023-09-13 NOTE — ED Notes (Signed)
Sitter notified this RN that pt was touching himself. This RN went to assess pt and pt had his genitals out and was masturbating. This RN stated, "That is inappropriate and you need to pull your pants up now." Pt complied w/ instructions.

## 2023-09-13 NOTE — ED Notes (Signed)
Assumed care of patient, NAD noted at this time, pt sleeping in bed, respirations even and unlabored, skin warm and dry, bed in low position, call light within reach. Comfort measures offered. Will continue to monitor. Safety sitter at bedside.

## 2023-09-13 NOTE — ED Notes (Signed)
IVC STATUS CURRENT

## 2023-09-13 NOTE — Progress Notes (Signed)
[facility] Psych ED Progress Note  09/13/2023 11:03 AM Steven Richardson  MRN:  [id number]   Subjective:   Pt seen at [facility] for face to face psychiatric reevaluation. Per chart review and speaking with ED staff, patient has shown some improvements recently. He is able to engage in a more linear conversation, compliant with PO medications, and hypersexual behaviors have decreased. This morning, patient did have episode where he attempted to masturbate in his room, however nursing asked he stop the inappropriate behaviors and he complied with instructions right away.   Today, he is slightly sedated as he was given PRN medication this morning. It was difficult to assess his though process, content, and any delusions or paranoia due to his minimal participation due to sedation. He did deny Si/HI/AVH. He did mention he is not sleeping well at night. Nursing also confirmed he is still having periods of awaking up during the night and staying awake. Instead of scheduled Trazodone 50 mg at bedtime, will change to Seroquel 50 mg at bedtime. At this time, I do believe a second antipsychotic is indicated.   Will continue to recommend IVC and psychiatric inpatient treatment. Currently barrier to IP treatment was the patient's agitation and hypersexual behaviors. Pt has been declined from [place], [facility], [facility], [place], and [place] due to above stated. Patient behaviors and symptoms are improving, will continue to have CSW refer daily with updated notes.   Principal Problem: Schizoaffective disorder, bipolar type (HCC) Diagnosis:  Principal Problem:   Schizoaffective disorder, bipolar type [facility])   ED Assessment Time Calculation: Start Time: 1000 Stop Time: 1030 Total Time in Minutes (Assessment Completion): 30   Past Psychiatric History: schizoaffective disorder, bipolar type  [place] Scale:  Flowsheet Row ED from 09/04/2023 in [facility] Emergency Department at [facility] Most  recent reading at 09/08/2023  7:55 AM ED from 09/04/2023 in [facility] Most recent reading at 09/04/2023  3:57 PM ED from 12/02/2021 in [facility] Emergency Department at [facility] Most recent reading at 12/02/2021  8:52 PM  C-SSRS RISK CATEGORY No Risk No Risk No Risk       Past Medical History:  Past Medical History:  Diagnosis Date   Constipation    Diabetes mellitus without complication (HCC)    Paranoid schizophrenia (HCC)     Past Surgical History:  Procedure Laterality Date   NO PAST SURGERIES     Family History:  Family History  Problem Relation Age of Onset   Healthy Mother    Diabetes Mother    Healthy Father    Colon cancer Neg Hx    Esophageal cancer Neg Hx    Stomach cancer Neg Hx    Pancreatic cancer Neg Hx    Family Psychiatric  History: unknown Social History:  Social History   Substance and Sexual Activity  Alcohol Use No     Social History   Substance and Sexual Activity  Drug Use No    Social History   Socioeconomic History   Marital status: Single    Spouse name: Not on file   Number of children: Not on file   Years of education: Not on file   Highest education level: Not on file  Occupational History   Not on file  Tobacco Use   Smoking status: Every Day    Types: Cigarettes   Smokeless tobacco: Never  Vaping Use   Vaping status: Never Used  Substance and Sexual Activity  Alcohol use: No   Drug use: No   Sexual activity: Yes  Other Topics Concern   Not on file  Social History Narrative   Not on file   Social Determinants of Health   Financial Resource Strain: Medium Risk (12/27/2022)   [facility]   Overall Financial Resource Strain (CARDIA)    Difficulty of Paying Living Expenses: Somewhat hard  Food Insecurity: No Food Insecurity (05/04/2023)   Received from [facility]   Hunger Vital Sign    Worried About Running Out of Food in the Last Year: Never true    Ran Out of  Food in the Last Year: Never true  Transportation Needs: No Transportation Needs (05/04/2023)   [facility] - Transportation    Lack of Transportation (Medical): No    Lack of Transportation (Non-Medical): No  Physical Activity: Insufficiently Active (02/13/2022)   [facility]   Exercise Vital Sign    Days of Exercise per Week: 3 days    Minutes of Exercise per Session: 30 min  Stress: Stress Concern Present (05/04/2023)   Received from [facility] - Occupational Stress Questionnaire    Feeling of Stress : Very much  Social Connections: Unknown (02/14/2023)   [facility] from [facility]   Social Network    Social Network: Not on file    Sleep: Fair  Appetite:  Good  Current Medications: Current Facility-Administered Medications  Medication Dose Route Frequency Provider Last Rate Last Admin   diphenhydrAMINE (BENADRYL) capsule 50 mg  50 mg Oral Q6H PRN Lenox Ponds, NP   50 mg at 09/13/23 0818   Or   diphenhydrAMINE (BENADRYL) injection 50 mg  50 mg Intramuscular Q6H PRN Lenox Ponds, NP   50 mg at 09/09/23 1651   haloperidol (HALDOL) tablet 10 mg  10 mg Oral BID Cathren Laine, MD   10 mg at 09/13/23 1032   haloperidol (HALDOL) tablet 5 mg  5 mg Oral Q6H PRN Lenox Ponds, NP   5 mg at 09/13/23 1610   Or   haloperidol lactate (HALDOL) injection 5 mg  5 mg Intramuscular Q6H PRN Lenox Ponds, NP   5 mg at 09/09/23 1416   hydrOXYzine (ATARAX) tablet 25 mg  25 mg Oral Q6H PRN Cathren Laine, MD   25 mg at 09/09/23 0824   lithium carbonate (ESKALITH) ER tablet 900 mg  900 mg Oral Q12H Cathren Laine, MD   900 mg at 09/13/23 1033   LORazepam (ATIVAN) tablet 2 mg  2 mg Oral Q6H PRN Lenox Ponds, NP   2 mg at 09/13/23 9604   Or   LORazepam (ATIVAN) injection 2 mg  2 mg Intramuscular Q6H PRN Lenox Ponds, NP   2 mg at 09/09/23 1144   QUEtiapine (SEROQUEL) tablet 50 mg  50 mg Oral QHS  Eligha Bridegroom, NP       traZODone (DESYREL) tablet 50 mg  50 mg Oral QHS PRN Eligha Bridegroom, NP       Current Outpatient Medications  Medication Sig Dispense Refill   hydrochlorothiazide (HYDRODIURIL) 25 MG tablet Take 25 mg by mouth in the morning.     OLANZapine (ZYPREXA) 15 MG tablet Take 15 mg by mouth in the morning and at bedtime.     benztropine (COGENTIN) 1 MG tablet Take 1 tablet (1 mg total) by mouth 2 (two) times daily. (Patient not taking: Reported on  09/05/2023) 60 tablet 0   divalproex (DEPAKOTE ER) 500 MG 24 hr tablet Take 2 tablets (1,000 mg total) by mouth 2 (two) times daily. (Patient not taking: Reported on 09/05/2023) 120 tablet 0   doxycycline (VIBRAMYCIN) 100 MG capsule Take 1 capsule (100 mg total) by mouth 2 (two) times daily. (Patient not taking: Reported on 09/05/2023) 14 capsule 0   fluconazole (DIFLUCAN) 200 MG tablet Take 200 mg by mouth once a week. For 4 weeks. (Patient not taking: Reported on 09/05/2023)     lithium carbonate (LITHOBID) 300 MG CR tablet Take one tablet (300 mg) by mouth every morning and two tablets (600 mg) by mouth at bedtime. (Patient not taking: Reported on 09/05/2023) 90 tablet 0   propranolol (INDERAL) 20 MG tablet Take 1 tablet (20 mg total) by mouth 2 (two) times daily. (Patient not taking: Reported on 09/05/2023) 60 tablet 0   QUEtiapine (SEROQUEL) 400 MG tablet Take 1 tablet (400 mg total) by mouth at bedtime for 14 days. (Patient not taking: Reported on 09/05/2023) 14 tablet 0   QUEtiapine (SEROQUEL) 50 MG tablet Take 1 tablet (50 mg total) by mouth daily. (Patient not taking: Reported on 09/05/2023) 30 tablet 0   risperiDONE (RISPERDAL) 2 MG tablet Take 1.5 tablets (3 mg total) by mouth at bedtime. (Patient not taking: Reported on 09/05/2023) 45 tablet 0    Lab Results: No results found for this or any previous visit (from the past 48 hour(s)).  Blood Alcohol level:  Lab Results  Component Value Date   ETH <10 09/04/2023   ETH <10  03/18/2020    Physical Findings:  CIWA:    COWS:     Musculoskeletal: Strength & Muscle Tone: within normal limits Gait & Station: normal Patient leans: N/A  Psychiatric Specialty Exam:  Presentation  General Appearance:  Fairly Groomed  Eye Contact: Good  Speech: Clear and Coherent  Speech Volume: Normal  Handedness: Right   Mood and Affect  Mood: Euthymic  Affect: Congruent   Thought Process  Thought Processes: Disorganized  Descriptions of Associations:Tangential  Orientation:Full (Time, Place and Person)  Thought Content:Illogical; Tangential  History of Schizophrenia/Schizoaffective disorder:No data recorded Duration of Psychotic Symptoms:No data recorded Hallucinations:Hallucinations: None  Ideas of Reference:None  Suicidal Thoughts:Suicidal Thoughts: No  Homicidal Thoughts:Homicidal Thoughts: No   Sensorium  Memory: Immediate Poor; Recent Poor  Judgment: Impaired  Insight: Lacking   Executive Functions  Concentration: Fair  Attention Span: Fair  Recall: Poor  Fund of Knowledge: Poor  Language: Poor   Psychomotor Activity  Psychomotor Activity: Psychomotor Activity: Increased   Assets  Assets: Desire for Improvement; Physical Health   Sleep  Sleep: Sleep: Fair    Physical Exam: Physical Exam Neurological:     Mental Status: He is alert and oriented to person, place, and time.  Psychiatric:        Attention and Perception: He is inattentive.        Mood and Affect: Mood normal.        Speech: Speech normal.        Behavior: Behavior is cooperative.    Review of Systems  Psychiatric/Behavioral:         Psychosis, delusions, sleep disturbance, hypersexual behaviors  All other systems reviewed and are negative.  Blood pressure 120/81, pulse 69, temperature 98 F (36.7 C), temperature source Oral, resp. rate 16, height 5\' 11"  (1.803 m), weight 73.5 kg, SpO2 98%. Body mass index is 22.59  kg/m.   Medical Decision Making: Pt  case reviewed and discussed with Dr. Clovis Riley. Will continue to recommend IVC and inpatient psychiatric treatment.   Problem 1: psychosis, disturbed sleep - Discontinue scheduled Trazodone 50 mg at bedtime and change to PRN - Start scheduled Seroquel 50 mg at bedtime    Eligha Bridegroom, NP 09/13/2023, 11:03 AM

## 2023-09-14 DIAGNOSIS — F250 Schizoaffective disorder, bipolar type: Secondary | ICD-10-CM | POA: Diagnosis not present

## 2023-09-14 NOTE — ED Notes (Signed)
Meal tray provided.

## 2023-09-14 NOTE — ED Notes (Addendum)
Pt was taking a shower and came out of shower w/ soaked pants. Water noted to have come out of shower room. Sitter noted that pt defecated in the shower. D/t pt behavior, bathroom privileges will be revoked and pt will be provided w/ bedside commode and urinal in his room. Scrubs and shower supplies noted to not have been used when this Designer, television/film set went to clean the shower room.

## 2023-09-14 NOTE — ED Notes (Signed)
NAD noted at this time, pt resting in bed, respirations even and unlabored, skin warm and dry, bed in low position. Will continue to monitor. Safety sitter at bedside.

## 2023-09-14 NOTE — ED Provider Notes (Signed)
Emergency Medicine Observation Re-evaluation Note  Steven Richardson is a 42 y.o. male, seen on rounds today.  Pt initially presented to the ED for complaints of Medical Clearance Currently, the patient is calm, sitting upright.  However, the patient had an episode overnight which he defecated in the shower..  Physical Exam  BP 109/77 (BP Location: Left Arm)   Pulse 67   Temp 97.7 F (36.5 C) (Oral)   Resp 17   Ht 5\' 11"  (1.803 m)   Wt 73.5 kg   SpO2 99%   BMI 22.59 kg/m  Physical Exam General: No distress Cardiac: Regular rate and rhythm Lungs: No increased work of breathing Psych: No insight  ED Course / MDM  EKG:EKG Interpretation Date/Time:  Thursday September 04 2023 23:32:53 EDT Ventricular Rate:  69 PR Interval:  154 QRS Duration:  86 QT Interval:  394 QTC Calculation: 422 R Axis:   43  Text Interpretation: Undetermined rhythm Otherwise normal ECG No previous ECGs available Confirmed by Vonita Moss [phone]) on 09/05/2023 8:23:02 AM  I have reviewed the labs performed to date as well as medications administered while in observation.  Recent changes in the last 24 hours include as above 1 episode of defecating in the shower.  Plan  Current plan is for placement.    Gerhard Munch, MD 09/14/23 [phone]

## 2023-09-14 NOTE — Consult Note (Signed)
  Attempted psychiatric reevaluation, however patient is sleeping and staff requested to not wake. Pt continues to be bizarre and delusional. He has remained compliant with medications. Plan remains the same, continues to meet criteria for IVC and inpatient psychiatric treatment.   [facility] has expressed they will rereview patient this week if bed availability becomes available.

## 2023-09-14 NOTE — ED Notes (Signed)
Pt attempting to call his wife

## 2023-09-15 DIAGNOSIS — F250 Schizoaffective disorder, bipolar type: Secondary | ICD-10-CM | POA: Diagnosis not present

## 2023-09-15 NOTE — ED Notes (Addendum)
Charge Nurse, Fleet Contras, notified that Staffing does not have a safety sitter to provide after 0300.

## 2023-09-15 NOTE — Progress Notes (Signed)
Inpatient Behavioral Health Placement  This CSW called [facility] and spoke with Corrie Dandy, Intake who request the past two days of clinical documentation. This CSW sent clinical documents as requested via EPIC. Pt remain under review for the [facility] waitlist. CSW/ Disposition team will assist and follow with placement.   CSW sent referral out of network providers due to no available beds within [facility] per [facility] Rona Ravens, RN:    Destination  Service Provider Address Phone Fax  [facility]  [address] [place] [address] [phone]  [facility]  [address] [place], [place] [address] [phone]  [facility] Patient Placement  [facility] [place], [place] [phone]  [facility] [address]  [place] [address] [phone]  [facility]  [address] [place] [address] [phone]  [facility]  [address] [place] [address] [phone]  [facility]  [address], [place] [address] [phone]  [facility]  [address], [place] [address] [phone]  [facility]  [address], [place] [address] [phone]  [facility] EFAX  [address] [place], [place] [phone]  [facility]  [address], [place] [address] [phone]  [facility]  [address] [place] [address] [phone]  [facility]  [address] [place] [address] [phone]  [facility]  [address] [place] [address] [phone]  [facility]  [address], [place] [address] [phone]  [facility]  [address] [place] [address] [phone]  [facility] [address]  [address], [place] [address] [phone]  [facility]  [address] [place], [place] [address] [phone]  [facility]  [address] [place] [address] [phone]  [facility]  [address], [place] [address] [phone]  [facility]  [address], [place] [address] [phone]  [facility]  [address], [place] [address] [phone]  [facility]  [address], [place] [address] [phone]  [facility] Inpatient EFAX  [place] [phone]  [facility]  [address], [place] [address] [phone]  [facility]  [address] [place], [place] [address] [phone]  [facility]  [place] [phone]  [facility]  [address] [place] [address] [phone]  [facility]  [address], [place] [address] [phone]  [facility]  [address] [place] [address] [phone]  [facility]  [address], [place] [address] [phone]  [facility] (0)  [address] [place] [address] [phone]  [facility]  [address] [place] [address] [phone]   Maryjean Ka, MSW, LCSWA 09/15/2023 10:16 PM

## 2023-09-15 NOTE — ED Provider Notes (Signed)
Emergency Medicine Observation Re-evaluation Note  Steven Richardson is a 42 y.o. male, seen on rounds today.  Pt initially presented to the ED for complaints of Medical Clearance Currently, the patient is resting in his room, has not had any additional incidents since yesterday. Physical Exam  BP 109/69 (BP Location: Right Arm)   Pulse (!) 59   Temp 98.3 F (36.8 C)   Resp 14   Ht 5\' 11"  (1.803 m)   Wt 73.5 kg   SpO2 98%   BMI 22.59 kg/m  Physical Exam Vitals reviewed.  Cardiovascular:     Rate and Rhythm: Normal rate.  Pulmonary:     Effort: Pulmonary effort is normal.  Neurological:     General: No focal deficit present.     Mental Status: He is alert.  Psychiatric:        Mood and Affect: Mood normal.     ED Course / MDM  EKG:EKG Interpretation Date/Time:  Thursday September 04 2023 23:32:53 EDT Ventricular Rate:  69 PR Interval:  154 QRS Duration:  86 QT Interval:  394 QTC Calculation: 422 R Axis:   43  Text Interpretation: Undetermined rhythm Otherwise normal ECG No previous ECGs available Confirmed by Vonita Moss [phone]) on 09/05/2023 8:23:02 AM  I have reviewed the labs performed to date as well as medications administered while in observation.  Recent changes in the last 24 hours include no new changes.  Plan  Current plan is for inpatient placement.    Anders Simmonds T, DO 09/15/23 1110

## 2023-09-15 NOTE — Progress Notes (Signed)
LCSW Progress Note  [id number]   Steven Richardson  09/15/2023  11:25 AM  Description:   Inpatient Psychiatric Referral  Patient was recommended inpatient per Eligha Bridegroom, NP. There are [facility] beds at [facility], per [facility] Rona Ravens, RN. Patient was referred to the following out of network facilities:   Production assistant, radio Address Phone Fax  [facility]  [address] [place], [place] [address] [phone]  [facility]  [address] [place] [address] [phone]  [facility]  [address] [place] [address] [phone]  [facility]  [address] [place] [address] [phone]  [facility]  [address], [place] [address] [phone]  [facility] EFAX  [address] [place], [place] [phone]  [facility]  [address], [place] [address] [phone]  [facility]  [address] [place] [address] [phone]  [facility]  [address], [place] [address] [phone]  [facility]  [address], [place] [address] [phone]  [facility]  [address] [place] [address] [phone]  [facility] [address]  [address], [place] [address] [phone]  [facility]  [address] [place], [place] [address] [phone]  [facility]  [address] [place] [address] [phone]  [facility]  [address] [place] [address] [phone]  [facility] Inpatient EFAX   [place] [phone]  [facility]  [address] [place] [address] [phone]  [facility]  [place] [phone]    Situation ongoing, CSW to continue following and update chart as more information becomes available.      Cathie Beams, MSW, LCSW  09/15/2023 11:25 AM

## 2023-09-15 NOTE — ED Notes (Signed)
Pt ambulatory to nurse station and politely asked this RN about his hospital stay and how long he would be here.  This RN explained he could not provide a timeframe but reassured pt that staff would continue to work with him to ensure a safe plan once he does get discharged from the hospital or a facility.  Pt states he would be interested in getting his prescriptions and going back to his apartment.  This RN reassured pt that once he does leave/finish treatment, his healthcare team could work with him on proper medications.  This RN further reassured pt to continue participating in his care and being cooperative for his best outcome in regards to treatment.

## 2023-09-15 NOTE — ED Notes (Signed)
Pt asking for number to his apartment complex.  States he stays at [facility] in [place].  Pt voicing concerns about losing his apartment.  This RN explained that their office was likely closed at this time but he could possibly call them in the morning when their office would be open again.

## 2023-09-15 NOTE — ED Notes (Signed)
Patient was given a coke with cup of ice.

## 2023-09-15 NOTE — Progress Notes (Signed)
[facility] ED Progress Note  09/15/2023 11:54 AM Steven Richardson  MRN:  [id number]   Subjective:   Pt seen at [facility] for face to face psychiatric reevaluation. Pt is standing outside his room, appears alert and oriented. Pt states his sleep has improved, he feels like he is not waking up at much during the night. No problems with appetite. He denies SI/HI/AVH. He has been compliant with medications. Pt appears to still have no insight as to why he is in the hospital. When conversation ended patient started skipping down the hall back to his room.   Pt still displaying bizarre behaviors, delusional thinking, lack of insight and judgement. Pt behaviors such as nudity and hypersexuality has seemed to improve. He is more redirectable and able to listen to instructions well per staff. Will continue to recommend for Inpatient Treatment and IVC.  Principal Problem: Schizoaffective disorder, bipolar type (HCC) Diagnosis:  Principal Problem:   Schizoaffective disorder, bipolar type [facility])   ED Assessment Time Calculation: Start Time: 1130 Stop Time: 1150 Total Time in Minutes (Assessment Completion): 20    [place] Scale:  Flowsheet Row ED from 09/04/2023 in [facility] Emergency Department at [facility] Most recent reading at 09/08/2023  7:55 AM ED from 09/04/2023 in [facility] Most recent reading at 09/04/2023  3:57 PM ED from 12/02/2021 in [facility] Emergency Department at [facility] Most recent reading at 12/02/2021  8:52 PM  C-SSRS RISK CATEGORY No Risk No Risk No Risk       Past Medical History:  Past Medical History:  Diagnosis Date   Constipation    Diabetes mellitus without complication (HCC)    Paranoid schizophrenia (HCC)     Past Surgical History:  Procedure Laterality Date   NO PAST SURGERIES     Family History:  Family History  Problem Relation Age of Onset   Healthy Mother    Diabetes Mother    Healthy Father    Colon cancer  Neg Hx    Esophageal cancer Neg Hx    Stomach cancer Neg Hx    Pancreatic cancer Neg Hx    Family Psychiatric  History: unknown  Social History:  Social History   Substance and Sexual Activity  Alcohol Use No     Social History   Substance and Sexual Activity  Drug Use No    Social History   Socioeconomic History   Marital status: Single    Spouse name: Not on file   Number of children: Not on file   Years of education: Not on file   Highest education level: Not on file  Occupational History   Not on file  Tobacco Use   Smoking status: Every Day    Types: Cigarettes   Smokeless tobacco: Never  Vaping Use   Vaping status: Never Used  Substance and Sexual Activity   Alcohol use: No   Drug use: No   Sexual activity: Yes  Other Topics Concern   Not on file  Social History Narrative   Not on file   Social Determinants of Health   Financial Resource Strain: Medium Risk (12/27/2022)   Received from [facility]   Overall Financial Resource Strain (CARDIA)    Difficulty of Paying Living Expenses: Somewhat hard  Food Insecurity: No Food Insecurity (05/04/2023)   Received from [facility]   Hunger Vital Sign    Worried About Running Out of Food in the Last Year: Never true  Ran Out of Food in the Last Year: Never true  Transportation Needs: No Transportation Needs (05/04/2023)   Received from [facility] - Transportation    Lack of Transportation (Medical): No    Lack of Transportation (Non-Medical): No  Physical Activity: Insufficiently Active (02/13/2022)   Received from [facility]   Exercise Vital Sign    Days of Exercise per Week: 3 days    Minutes of Exercise per Session: 30 min  Stress: Stress Concern Present (05/04/2023)   Received from [facility] - Occupational Stress Questionnaire    Feeling of Stress : Very much  Social Connections: Unknown (02/14/2023)   Received from [facility]   Social  Network    Social Network: Not on file    Sleep: Fair  Appetite:  Good  Current Medications: Current Facility-Administered Medications  Medication Dose Route Frequency Provider Last Rate Last Admin   diphenhydrAMINE (BENADRYL) capsule 50 mg  50 mg Oral Q6H PRN Lenox Ponds, NP   50 mg at 09/13/23 0818   Or   diphenhydrAMINE (BENADRYL) injection 50 mg  50 mg Intramuscular Q6H PRN Lenox Ponds, NP   50 mg at 09/09/23 1651   haloperidol (HALDOL) tablet 10 mg  10 mg Oral BID Cathren Laine, MD   10 mg at 09/15/23 0910   haloperidol (HALDOL) tablet 5 mg  5 mg Oral Q6H PRN Lenox Ponds, NP   5 mg at 09/13/23 9528   Or   haloperidol lactate (HALDOL) injection 5 mg  5 mg Intramuscular Q6H PRN Lenox Ponds, NP   5 mg at 09/09/23 1416   hydrOXYzine (ATARAX) tablet 25 mg  25 mg Oral Q6H PRN Cathren Laine, MD   25 mg at 09/14/23 2009   lithium carbonate (ESKALITH) ER tablet 900 mg  900 mg Oral Q12H Cathren Laine, MD   900 mg at 09/15/23 0910   LORazepam (ATIVAN) tablet 2 mg  2 mg Oral Q6H PRN Lenox Ponds, NP   2 mg at 09/13/23 4132   Or   LORazepam (ATIVAN) injection 2 mg  2 mg Intramuscular Q6H PRN Lenox Ponds, NP   2 mg at 09/09/23 1144   QUEtiapine (SEROQUEL) tablet 50 mg  50 mg Oral QHS Eligha Bridegroom, NP   50 mg at 09/14/23 2126   traZODone (DESYREL) tablet 50 mg  50 mg Oral QHS PRN Eligha Bridegroom, NP   50 mg at 09/13/23 2135   Current Outpatient Medications  Medication Sig Dispense Refill   hydrochlorothiazide (HYDRODIURIL) 25 MG tablet Take 25 mg by mouth in the morning.     OLANZapine (ZYPREXA) 15 MG tablet Take 15 mg by mouth in the morning and at bedtime.     benztropine (COGENTIN) 1 MG tablet Take 1 tablet (1 mg total) by mouth 2 (two) times daily. (Patient not taking: Reported on 09/05/2023) 60 tablet 0   divalproex (DEPAKOTE ER) 500 MG 24 hr tablet Take 2 tablets (1,000 mg total) by mouth 2 (two) times daily. (Patient not taking: Reported on 09/05/2023) 120  tablet 0   doxycycline (VIBRAMYCIN) 100 MG capsule Take 1 capsule (100 mg total) by mouth 2 (two) times daily. (Patient not taking: Reported on 09/05/2023) 14 capsule 0   fluconazole (DIFLUCAN) 200 MG tablet Take 200 mg by mouth once a week. For 4 weeks. (Patient not taking: Reported on 09/05/2023)     lithium carbonate (LITHOBID) 300 MG CR tablet  Take one tablet (300 mg) by mouth every morning and two tablets (600 mg) by mouth at bedtime. (Patient not taking: Reported on 09/05/2023) 90 tablet 0   propranolol (INDERAL) 20 MG tablet Take 1 tablet (20 mg total) by mouth 2 (two) times daily. (Patient not taking: Reported on 09/05/2023) 60 tablet 0   QUEtiapine (SEROQUEL) 400 MG tablet Take 1 tablet (400 mg total) by mouth at bedtime for 14 days. (Patient not taking: Reported on 09/05/2023) 14 tablet 0   QUEtiapine (SEROQUEL) 50 MG tablet Take 1 tablet (50 mg total) by mouth daily. (Patient not taking: Reported on 09/05/2023) 30 tablet 0   risperiDONE (RISPERDAL) 2 MG tablet Take 1.5 tablets (3 mg total) by mouth at bedtime. (Patient not taking: Reported on 09/05/2023) 45 tablet 0    Lab Results: No results found for this or any previous visit (from the past 48 hour(s)).  Blood Alcohol level:  Lab Results  Component Value Date   ETH <10 09/04/2023   ETH <10 03/18/2020    Physical Findings:  CIWA:    COWS:     Musculoskeletal: Strength & Muscle Tone: within normal limits Gait & Station: normal Patient leans: N/A  Psychiatric Specialty Exam:  Presentation  General Appearance:  Appropriate for Environment  Eye Contact: Fair  Speech: Clear and Coherent  Speech Volume: Normal  Handedness: Right   Mood and Affect  Mood: Euthymic  Affect: Congruent   Thought Process  Thought Processes: Disorganized  Descriptions of Associations:Tangential  Orientation:Full (Time, Place and Person)  Thought Content:Illogical; Delusions  History of Schizophrenia/Schizoaffective  disorder:Yes  Duration of Psychotic Symptoms:Greater than six months  Hallucinations:Hallucinations: None  Ideas of Reference:None  Suicidal Thoughts:Suicidal Thoughts: No  Homicidal Thoughts:Homicidal Thoughts: No   Sensorium  Memory: Immediate Fair; Recent Fair  Judgment: Impaired  Insight: Lacking   Executive Functions  Concentration: Fair  Attention Span: Fair  Recall: [employer] of Knowledge: Fair  Language: Fair   Psychomotor Activity  Psychomotor Activity: Psychomotor Activity: Restlessness   Assets  Assets: Desire for Improvement   Sleep  Sleep: Sleep: Fair    Physical Exam: Physical Exam Neurological:     Mental Status: He is alert and oriented to person, place, and time.  Psychiatric:        Attention and Perception: He is inattentive.        Mood and Affect: Affect is labile.        Speech: Speech normal.        Behavior: Behavior is cooperative.        Thought Content: Thought content is delusional.    Review of Systems  Psychiatric/Behavioral:         Delusional, bizarre behaviors  All other systems reviewed and are negative.  Blood pressure 109/69, pulse (!) 59, temperature 98.3 F (36.8 C), resp. rate 14, height 5\' 11"  (1.803 m), weight 73.5 kg, SpO2 98%. Body mass index is 22.59 kg/m.   Medical Decision Making: Pt case reviewed and discussed with Dr. Viviano Simas. Will continue to recommend inpatient psychiatric treatment and IVC .   - [facility] is at capacity and unable to accept at this time. They will continue to review if availability becomes open. CSW has faxed out to other facilities for review.   Eligha Bridegroom, NP 09/15/2023, 11:54 AM

## 2023-09-15 NOTE — ED Notes (Signed)
Patient was given a cup of coffee.

## 2023-09-16 ENCOUNTER — Inpatient Hospital Stay (HOSPITAL_COMMUNITY)
Admission: AD | Admit: 2023-09-16 | Discharge: 2023-09-21 | DRG: 885 | Disposition: A | Discharge status: Home / Self Care | Payer: Medicare HMO | Source: Intra-hospital | Attending: Psychiatry | Admitting: Psychiatry

## 2023-09-16 ENCOUNTER — Encounter (HOSPITAL_COMMUNITY): Payer: Self-pay | Admitting: Nurse Practitioner

## 2023-09-16 DIAGNOSIS — F250 Schizoaffective disorder, bipolar type: Principal | ICD-10-CM | POA: Diagnosis present

## 2023-09-16 DIAGNOSIS — Z79899 Other long term (current) drug therapy: Secondary | ICD-10-CM | POA: Diagnosis not present

## 2023-09-16 DIAGNOSIS — Z833 Family history of diabetes mellitus: Secondary | ICD-10-CM

## 2023-09-16 DIAGNOSIS — Z87891 Personal history of nicotine dependence: Secondary | ICD-10-CM | POA: Diagnosis not present

## 2023-09-16 DIAGNOSIS — E669 Obesity, unspecified: Secondary | ICD-10-CM | POA: Diagnosis present

## 2023-09-16 DIAGNOSIS — Z9151 Personal history of suicidal behavior: Secondary | ICD-10-CM

## 2023-09-16 DIAGNOSIS — E119 Type 2 diabetes mellitus without complications: Secondary | ICD-10-CM | POA: Diagnosis present

## 2023-09-16 DIAGNOSIS — Z560 Unemployment, unspecified: Secondary | ICD-10-CM | POA: Diagnosis not present

## 2023-09-16 DIAGNOSIS — F29 Unspecified psychosis not due to a substance or known physiological condition: Secondary | ICD-10-CM | POA: Diagnosis present

## 2023-09-16 LAB — COMPREHENSIVE METABOLIC PANEL
ALT: 22 U/L (ref 0–44)
AST: 17 U/L (ref 15–41)
Albumin: 3.3 g/dL — ABNORMAL LOW (ref 3.5–5.0)
Alkaline Phosphatase: 66 U/L (ref 38–126)
Anion gap: 8 (ref 5–15)
BUN: 10 mg/dL (ref 6–20)
CO2: 25 mmol/L (ref 22–32)
Calcium: 9.5 mg/dL (ref 8.9–10.3)
Chloride: 104 mmol/L (ref 98–111)
Creatinine, Ser: 0.99 mg/dL (ref 0.61–1.24)
GFR, Estimated: >60 mL/min (ref >60)
Glucose, Bld: 115 mg/dL — ABNORMAL HIGH (ref 70–99)
Potassium: 3.8 mmol/L (ref 3.5–5.1)
Sodium: 137 mmol/L (ref 135–145)
Total Bilirubin: 1 mg/dL (ref 0.3–1.2)
Total Protein: 6.3 g/dL — ABNORMAL LOW (ref 6.5–8.1)

## 2023-09-16 MED ORDER — TRAZODONE HCL 50 MG PO TABS
50.0000 mg | ORAL_TABLET | Freq: Every evening | ORAL | Status: DC | PRN
  Administered 2023-09-16 – 2023-09-20 (×5): 50 mg via ORAL
  Filled 2023-09-16 (×5): qty 1

## 2023-09-16 MED ORDER — LORAZEPAM 1 MG PO TABS: 2.0000 mg | ORAL_TABLET | Freq: Four times a day (QID) | ORAL | Status: DC | PRN

## 2023-09-16 MED ORDER — HALOPERIDOL 5 MG PO TABS
10.0000 mg | ORAL_TABLET | Freq: Two times a day (BID) | ORAL | Status: DC
  Administered 2023-09-16 – 2023-09-20 (×8): 10 mg via ORAL
  Filled 2023-09-16 (×12): qty 2

## 2023-09-16 MED ORDER — LORAZEPAM 2 MG/ML IJ SOLN: 2.0000 mg | Freq: Four times a day (QID) | INTRAMUSCULAR | Status: DC | PRN

## 2023-09-16 MED ORDER — ALUM & MAG HYDROXIDE-SIMETH 200-200-20 MG/5ML PO SUSP: 30.0000 mL | ORAL | Status: DC | PRN

## 2023-09-16 MED ORDER — QUETIAPINE FUMARATE 50 MG PO TABS
50.0000 mg | ORAL_TABLET | Freq: Every day | ORAL | Status: DC
  Administered 2023-09-16: 50 mg via ORAL
  Filled 2023-09-16 (×4): qty 1

## 2023-09-16 MED ORDER — LITHIUM CARBONATE ER 450 MG PO TBCR
900.0000 mg | EXTENDED_RELEASE_TABLET | Freq: Two times a day (BID) | ORAL | Status: DC
  Administered 2023-09-16 – 2023-09-21 (×10): 900 mg via ORAL
  Filled 2023-09-16 (×13): qty 2

## 2023-09-16 MED ORDER — DIPHENHYDRAMINE HCL 25 MG PO CAPS: 50.0000 mg | ORAL_CAPSULE | Freq: Four times a day (QID) | ORAL | Status: DC | PRN

## 2023-09-16 MED ORDER — HALOPERIDOL LACTATE 5 MG/ML IJ SOLN: 5.0000 mg | Freq: Four times a day (QID) | INTRAMUSCULAR | Status: DC | PRN

## 2023-09-16 MED ORDER — DIPHENHYDRAMINE HCL 50 MG/ML IJ SOLN: 50.0000 mg | Freq: Four times a day (QID) | INTRAMUSCULAR | Status: DC | PRN

## 2023-09-16 MED ORDER — MAGNESIUM HYDROXIDE 400 MG/5ML PO SUSP: 15.0000 mL | Freq: Every day | ORAL | Status: DC | PRN

## 2023-09-16 MED ORDER — HYDROXYZINE HCL 25 MG PO TABS
25.0000 mg | ORAL_TABLET | Freq: Three times a day (TID) | ORAL | Status: DC | PRN
  Administered 2023-09-18 – 2023-09-20 (×4): 25 mg via ORAL
  Filled 2023-09-16 (×4): qty 1

## 2023-09-16 MED ORDER — ACETAMINOPHEN 325 MG PO TABS: 650.0000 mg | ORAL_TABLET | Freq: Four times a day (QID) | ORAL | Status: DC | PRN

## 2023-09-16 MED ORDER — HALOPERIDOL 5 MG PO TABS: 5.0000 mg | ORAL_TABLET | Freq: Four times a day (QID) | ORAL | Status: DC | PRN

## 2023-09-16 NOTE — ED Provider Notes (Signed)
Emergency Medicine Observation Re-evaluation Note  Steven Richardson is a 42 y.o. male, seen on rounds today.  Pt initially presented to the ED for complaints of Medical Clearance Currently, the patient is asleep.  Physical Exam  BP 106/64 (BP Location: Right Arm)   Pulse 60   Temp 98.9 F (37.2 C) (Oral)   Resp 20   Ht 5\' 11"  (1.803 m)   Wt 73.5 kg   SpO2 97%   BMI 22.59 kg/m  Physical Exam General: nad Cardiac: regular rate Lungs: no resp distress Psych: asleep  ED Course / MDM  EKG:EKG Interpretation Date/Time:  Thursday September 04 2023 23:32:53 EDT Ventricular Rate:  69 PR Interval:  154 QRS Duration:  86 QT Interval:  394 QTC Calculation: 422 R Axis:   43  Text Interpretation: Undetermined rhythm Otherwise normal ECG No previous ECGs available Confirmed by Vonita Moss [phone]) on 09/05/2023 8:23:02 AM  I have reviewed the labs performed to date as well as medications administered while in observation.  Recent changes in the last 24 hours include agitation improved.  Plan  Current plan is for placement.    Sloan Leiter, DO 09/16/23 [phone]

## 2023-09-16 NOTE — ED Notes (Signed)
Pt received for care at 1900.  Pt ambulatory near door to room; calm and cooperative this evening.  Pt appears to be in good mood and asking this RN if he can "volunteer" around unit.  This RN explained that he did not need help with anything currently.  Pt remains in view of nurse station and sitter with necessary precautions maintained.

## 2023-09-16 NOTE — ED Notes (Signed)
Pt asking for medication to help him relax. See MAR.

## 2023-09-16 NOTE — ED Notes (Signed)
Pt ambulatory to restroom at this time.  Requesting drink and sandwich.  Pt notified that breakfast would be delivered in about an hour.

## 2023-09-16 NOTE — Progress Notes (Signed)
Pt has been accepted to [facility] TODAY 09/16/2023, pending repeat CMP and EKG. Bed assignment: 501-1  Pt meets inpatient criteria per Eligha Bridegroom, NP  Attending Physician will be Phineas Inches, MD  Report can be called to: - Adult unit: [phone]  Pt can arrive after pending items are received  Care Team Notified: [facility] Rona Ravens, RN, Malva Limes, RN, Grant Fontana, RN, and Eligha Bridegroom, NP  Cathie Beams, MSW, LCSW  09/16/2023 1:57 PM

## 2023-09-16 NOTE — Group Note (Signed)
Date:  09/16/2023 Time:  9:07 PM  Group Topic/Focus:  Wrap-Up Group:   The focus of this group is to help patients review their daily goal of treatment and discuss progress on daily workbooks.    Participation Level:  Active  Participation Quality:  Appropriate  Affect:  Appropriate  Cognitive:  Appropriate  Insight: Appropriate  Engagement in Group:  Engaged  Modes of Intervention:  Education and Exploration  Additional Comments:    Patient attended and participated in group tonight. He reports that he came from [facility] today.  Lita Mains [facility] 09/16/2023, 9:07 PM

## 2023-09-16 NOTE — Progress Notes (Signed)
LCSW Progress Note  [id number]   Steven Richardson  09/16/2023  9:40 AM  Description:   Inpatient Psychiatric Referral  Patient was recommended inpatient per Eligha Bridegroom, NP. There are no available beds at [facility], per [facility] Rona Ravens, RN. Patient was referred to the following out of network facilities:   Production assistant, radio Address Phone Fax  [facility]  [address] [place], [place] [address] [phone]  [facility]  [address] [place] [address] [phone]  [facility]  [address] [place] [address] [phone]  [facility]  [address], [place] [address] [phone]  [facility]  [address] [place] [address] [phone]  [facility] EFAX  [address] [place], [place] [phone]  [facility]  [address], [place] [address] [phone]  [facility]  [address] [place] [address] [phone]  [facility]  [address] [place] [address] [phone]  [facility]  [address], [place] [address] [phone]  [facility]  [address] [place] [address] [phone]  [facility] [address]  [address] [place] [address] [phone]  [facility]  [address] [place], [place] [address] [phone]  [facility]  [address] [place] [address] [phone]  [facility]  [address] [place] [address] [phone]  [facility] EFAX   [place] [phone]  [facility]  [address] [place] [address] [phone]  [facility]  [place] [phone]    Situation ongoing, CSW to continue following and update chart as more information becomes available.    Cathie Beams, MSW, LCSW  09/16/2023 9:40 AM

## 2023-09-16 NOTE — ED Notes (Signed)
IVC documents verified and are current.  IVC expires September 18, 2023 Copies verified in the purple zone.   Case Number: [id number]

## 2023-09-16 NOTE — Consult Note (Signed)
  Attempted to reassess patient but he had just started his shower. Per chart review and staff, patient has had improvement in aggressive and hypersexual behaviors. Pt has not had a indecent exposure or agitated incident since 09/13/23 per chart review. He has been compliant with medications, sleep has improved. Does appear he has been able to engage in a more linear conversation recently.   Will continue to recommend for inpatient psychiatric treatment at this time. He continues to meet criteria for IVC. CSW has continued to fax pt for review to other inpatient facilities. [facility] is currently at capacity, but have stated they will review patient again when availability becomes open.

## 2023-09-16 NOTE — ED Notes (Addendum)
Per Rona Ravens, RN it is okay to transport patient to [facility]. [employer] notified that patient needs to be transported to [facility].

## 2023-09-17 ENCOUNTER — Other Ambulatory Visit: Payer: Self-pay

## 2023-09-17 ENCOUNTER — Encounter (HOSPITAL_COMMUNITY): Payer: Self-pay

## 2023-09-17 LAB — URINALYSIS, ROUTINE W REFLEX MICROSCOPIC
Bilirubin Urine: NEGATIVE
Glucose, UA: NEGATIVE mg/dL
Hgb urine dipstick: NEGATIVE
Ketones, ur: NEGATIVE mg/dL
Leukocytes,Ua: NEGATIVE
Nitrite: NEGATIVE
Protein, ur: NEGATIVE mg/dL
Specific Gravity, Urine: 1.004 — ABNORMAL LOW (ref 1.005–1.030)
pH: 6 (ref 5.0–8.0)

## 2023-09-17 NOTE — Progress Notes (Signed)
Pt observed in the room a sleep, respiration are even and unlabored, reported to have had a good day, will continue to monitor.

## 2023-09-17 NOTE — [facility] Suicide Risk Assessment (Signed)
[facility] INPATIENT:  Family/Significant Other Suicide Prevention Education  Suicide Prevention Education:  Family/Significant Other Refusal to Support Patient after Discharge:  Suicide Prevention Education Not Provided:  Patient has identified home of family/significant other as the place the patient will be residing after discharge.  With written consent of the patient, two attempts were made to provide Suicide Prevention Education to 09-17-2023, Steven Richardson. Estranged spouse reports that she has a protective order to protect her and their children. Spouse This person indicates she will not be responsible for the patient after discharge.  [name] 09/17/2023,2:39 PM

## 2023-09-17 NOTE — Group Note (Signed)
Recreation Therapy Group Note   Group Topic:Problem Solving  Group Date: 09/17/2023 Start Time: 1026 End Time: 1042 Facilitators: Jaren Vanetten-McCall, LRT,CTRS Location: 500 Hall Dayroom   Group Topic: Communication, Team Building, Problem Solving  Goal Area(s) Addresses:  Patient will effectively work with peer towards shared goal.  Patient will identify skills used to make activity successful.  Patient will share challenges and verbalize solution-driven approaches used. Patient will identify how skills used during activity can be used to reach post d/c goals.   Intervention: STEM Activity   Group Description: [employer]. Patients were provided the following materials: 4 drinking straws, 5 rubber bands, 5 paper clips, 2 index cards, 2 drinking cups, and 2 toilet paper rolls. Using the provided materials patients were asked to build a launching mechanism to launch a ping pong ball across the room, approximately 10 feet. Patients were divided into teams of 3-5. Instructions required all materials be incorporated into the device, functionality of items left to the peer group's discretion.  Education: Pharmacist, community, Scientist, physiological, Air cabin crew, Building control surveyor.   Education Outcome: Acknowledges education/In group clarification offered/Needs additional education.    Affect/Mood: N/A   Participation Level: Did not attend    Clinical Observations/Individualized Feedback:     Plan: Continue to engage patient in RT group sessions 2-3x/week.   Kinze Labo-McCall, LRT,CTRS  09/17/2023 12:46 PM

## 2023-09-17 NOTE — Progress Notes (Signed)
Admission Note: report received from [facility] ED RN patient is a IVC 42 year old male from [facility] ED Pt was calm and cooperative during assessment. Denies SI/HI/AVH. Admission plan of care reviewed with pt, consent signed.  Personal belongings/skin assessment completed.   No contraband found.  Patient oriented to the unit, staff and room.  Routine safety checks initiated.  Verbalizes understanding of unit rules/protocols.   Patient is presently safe on the unit. No unsafe behaviors noted.  Q 15 minute safety checks maintained per unit protocol.

## 2023-09-17 NOTE — BH IP Treatment Plan (Signed)
Interdisciplinary Treatment and Diagnostic Plan Initial  09/17/2023 Time of Session: 1110 Steven Richardson MRN: [id number]  Principal Diagnosis: Schizoaffective disorder, bipolar type (HCC)  Secondary Diagnoses: Principal Problem:   Schizoaffective disorder, bipolar type (HCC)   Current Medications:  Current Facility-Administered Medications  Medication Dose Route Frequency Provider Last Rate Last Admin   acetaminophen (TYLENOL) tablet 650 mg  650 mg Oral Q6H PRN Eligha Bridegroom, NP       alum & mag hydroxide-simeth (MAALOX/MYLANTA) 200-200-20 MG/5ML suspension 30 mL  30 mL Oral Q4H PRN Eligha Bridegroom, NP       diphenhydrAMINE (BENADRYL) capsule 50 mg  50 mg Oral Q6H PRN Eligha Bridegroom, NP       Or   diphenhydrAMINE (BENADRYL) injection 50 mg  50 mg Intramuscular Q6H PRN Eligha Bridegroom, NP       haloperidol (HALDOL) tablet 10 mg  10 mg Oral BID Eligha Bridegroom, NP   10 mg at 09/17/23 0840   haloperidol (HALDOL) tablet 5 mg  5 mg Oral Q6H PRN Eligha Bridegroom, NP       Or   haloperidol lactate (HALDOL) injection 5 mg  5 mg Intramuscular Q6H PRN Eligha Bridegroom, NP       hydrOXYzine (ATARAX) tablet 25 mg  25 mg Oral TID PRN Eligha Bridegroom, NP       lithium carbonate (ESKALITH) ER tablet 900 mg  900 mg Oral Q12H Eligha Bridegroom, NP   900 mg at 09/17/23 0840   LORazepam (ATIVAN) tablet 2 mg  2 mg Oral Q6H PRN Eligha Bridegroom, NP       Or   LORazepam (ATIVAN) injection 2 mg  2 mg Intramuscular Q6H PRN Eligha Bridegroom, NP       magnesium hydroxide (MILK OF MAGNESIA) suspension 15 mL  15 mL Oral Daily PRN Eligha Bridegroom, NP       QUEtiapine (SEROQUEL) tablet 50 mg  50 mg Oral QHS Eligha Bridegroom, NP   50 mg at 09/16/23 2031   traZODone (DESYREL) tablet 50 mg  50 mg Oral QHS PRN Eligha Bridegroom, NP   50 mg at 09/16/23 2205   PTA Medications: Medications Prior to Admission  Medication Sig Dispense Refill Last Dose   benztropine (COGENTIN) 1 MG tablet Take 1 tablet (1 mg  total) by mouth 2 (two) times daily. (Patient not taking: Reported on 09/05/2023) 60 tablet 0    divalproex (DEPAKOTE ER) 500 MG 24 hr tablet Take 2 tablets (1,000 mg total) by mouth 2 (two) times daily. (Patient not taking: Reported on 09/05/2023) 120 tablet 0    doxycycline (VIBRAMYCIN) 100 MG capsule Take 1 capsule (100 mg total) by mouth 2 (two) times daily. (Patient not taking: Reported on 09/05/2023) 14 capsule 0    fluconazole (DIFLUCAN) 200 MG tablet Take 200 mg by mouth once a week. For 4 weeks. (Patient not taking: Reported on 09/05/2023)      hydrochlorothiazide (HYDRODIURIL) 25 MG tablet Take 25 mg by mouth in the morning.      lithium carbonate (LITHOBID) 300 MG CR tablet Take one tablet (300 mg) by mouth every morning and two tablets (600 mg) by mouth at bedtime. (Patient not taking: Reported on 09/05/2023) 90 tablet 0    OLANZapine (ZYPREXA) 15 MG tablet Take 15 mg by mouth in the morning and at bedtime.      propranolol (INDERAL) 20 MG tablet Take 1 tablet (20 mg total) by mouth 2 (two) times daily. (Patient not taking: Reported on 09/05/2023) 60 tablet 0  QUEtiapine (SEROQUEL) 400 MG tablet Take 1 tablet (400 mg total) by mouth at bedtime for 14 days. (Patient not taking: Reported on 09/05/2023) 14 tablet 0    QUEtiapine (SEROQUEL) 50 MG tablet Take 1 tablet (50 mg total) by mouth daily. (Patient not taking: Reported on 09/05/2023) 30 tablet 0    risperiDONE (RISPERDAL) 2 MG tablet Take 1.5 tablets (3 mg total) by mouth at bedtime. (Patient not taking: Reported on 09/05/2023) 45 tablet 0     Patient Stressors:    Patient Strengths:    Treatment Modalities: Medication Management, Group therapy, Case management,  1 to 1 session with clinician, Psychoeducation, Recreational therapy.   Physician Treatment Plan for Primary Diagnosis: Schizoaffective disorder, bipolar type (HCC) Long Term Goal(s):     Short Term Goals: Ability to identify changes in lifestyle to reduce recurrence of  condition will improve Ability to verbalize feelings will improve Ability to disclose and discuss suicidal ideas Ability to demonstrate self-control will improve Ability to identify and develop effective coping behaviors will improve Ability to maintain clinical measurements within normal limits will improve Compliance with prescribed medications will improve Ability to identify triggers associated with substance abuse/mental health issues will improve  Medication Management: Evaluate patient's response, side effects, and tolerance of medication regimen.  Therapeutic Interventions: 1 to 1 sessions, Unit Group sessions and Medication administration.  Evaluation of Outcomes: Progressing  Physician Treatment Plan for Secondary Diagnosis: Principal Problem:   Schizoaffective disorder, bipolar type (HCC)  Long Term Goal(s):     Short Term Goals: Ability to identify changes in lifestyle to reduce recurrence of condition will improve Ability to verbalize feelings will improve Ability to disclose and discuss suicidal ideas Ability to demonstrate self-control will improve Ability to identify and develop effective coping behaviors will improve Ability to maintain clinical measurements within normal limits will improve Compliance with prescribed medications will improve Ability to identify triggers associated with substance abuse/mental health issues will improve     Medication Management: Evaluate patient's response, side effects, and tolerance of medication regimen.  Therapeutic Interventions: 1 to 1 sessions, Unit Group sessions and Medication administration.  Evaluation of Outcomes: Progressing   RN Treatment Plan for Primary Diagnosis: Schizoaffective disorder, bipolar type (HCC) Long Term Goal(s): Knowledge of disease and therapeutic regimen to maintain health will improve  Short Term Goals: Ability to remain free from injury will improve, Ability to verbalize frustration and anger  appropriately will improve, Ability to demonstrate self-control, Ability to participate in decision making will improve, Ability to verbalize feelings will improve, Ability to disclose and discuss suicidal ideas, Ability to identify and develop effective coping behaviors will improve, and Compliance with prescribed medications will improve  Medication Management: RN will administer medications as ordered by provider, will assess and evaluate patient's response and provide education to patient for prescribed medication. RN will report any adverse and/or side effects to prescribing provider.  Therapeutic Interventions: 1 on 1 counseling sessions, Psychoeducation, Medication administration, Evaluate responses to treatment, Monitor vital signs and CBGs as ordered, Perform/monitor CIWA, COWS, AIMS and Fall Risk screenings as ordered, Perform wound care treatments as ordered.  Evaluation of Outcomes: Progressing   LCSW Treatment Plan for Primary Diagnosis: Schizoaffective disorder, bipolar type (HCC) Long Term Goal(s): Safe transition to appropriate next level of care at discharge, Engage patient in therapeutic group addressing interpersonal concerns.  Short Term Goals: Engage patient in aftercare planning with referrals and resources, Increase social support, Increase ability to appropriately verbalize feelings, Increase emotional regulation, Facilitate acceptance of  mental health diagnosis and concerns, Facilitate patient progression through stages of change regarding substance use diagnoses and concerns, Identify triggers associated with mental health/substance abuse issues, and Increase skills for wellness and recovery  Therapeutic Interventions: Assess for all discharge needs, 1 to 1 time with Social worker, Explore available resources and support systems, Assess for adequacy in community support network, Educate family and significant other(s) on suicide prevention, Complete Psychosocial Assessment,  Interpersonal group therapy.  Evaluation of Outcomes: Progressing   Progress in Treatment: Attending groups: No. Participating in groups: No. Taking medication as prescribed: Yes. Toleration medication: Yes. Family/Significant other contact made: No, will contact:  Consents Pending Patient understands diagnosis: Yes. Discussing patient identified problems/goals with staff: Yes. Medical problems stabilized or resolved: Yes. Denies suicidal/homicidal ideation: Yes. Issues/concerns per patient self-inventory: Yes. Other: N/A  New problem(s) identified: No, Describe:  None reported  New Short Term/Long Term Goal(s):medication stabilization, elimination of SI thoughts, development of comprehensive mental wellness plan.   Patient Goals:  Medication Stabilization  Discharge Plan or Barriers: :  Patient recently admitted. CSW will continue to follow and assess for appropriate referrals and possible discharge planning.   Reason for Continuation of Hospitalization: Aggression Anxiety Delusions  Hallucinations Medication stabilization  Estimated Length of Stay: 5-7 days  Last 3 [place] Suicide Severity Risk Score: Flowsheet Row Ad[facility] (Current[facility] Most recent reading at 09/16/2023 11:21 PM ED from 09/04/2023 in [facility] Emergency Department at [facility] Most recent reading at 09/08/2023  7:55 AM ED from 09/04/2023 in [facility] Most recent reading at 09/04/2023  3:57 PM  C-SSRS RISK CATEGORY No Risk No Risk No Risk       Last PHQ 2/9 Scores:     No data to display         medication stabilization, elimination of SI thoughts, development of comprehensive mental wellness plan.    Scribe for Treatment Team: Ane Payment, LCSW 09/17/2023 1:18 PM

## 2023-09-17 NOTE — Plan of Care (Signed)
°  Problem: Education: °Goal: Emotional status will improve °Outcome: Progressing °Goal: Mental status will improve °Outcome: Progressing °Goal: Verbalization of understanding the information provided will improve °Outcome: Progressing °  °

## 2023-09-17 NOTE — H&P (Signed)
Psychiatric Admission Assessment Adult  Patient Identification: Steven Richardson MRN:  [id number] Date of Evaluation:  09/17/2023  Chief Complaint:  Schizoaffective disorder, bipolar type (HCC) [F25.0],  Schizoaffective disorder, bipolar type (HCC)  Principal Problem:   Schizoaffective disorder, bipolar type (HCC)   History of Present Illness:  Steven Richardson is a 42 y.o., male with a past psychiatric history significant for schizoaffective disorder bipolar type who presents to the [facility] Involuntary from [facility] Emergency Department for evaluation and management of psychosis.   Initial assessment on 10/16, patient was evaluated on the inpatient unit, the patient reports feeling like he is in a "cycle of IVC". When I asked him why he is IVCed, he was unsure. He states that he cussed out the judge in court. When I asked him why he is in court, he was unsure. Patient states that he was previously on lithium in which he felt has been helpful. When I asked him what has been occurring prior to hospitalization, he was not sure. He denies having stressors at this time but in the past, he felt overwhelmed from moving from [place] to [place] about 20 years ago.   He denies SI, HI, and AVH currently. He reports previously having AH in 2013 and states "I would dance" and he started dancing. He denies symptoms of depression and states his appetite are good. He reports minimally sleeping, stating he sleeps 1-2 hours daily which for him is "normal" He denied anhedonia, feelings of guilt, low energy or concentration, and psychomotor changes. He states that he feels anxious because he wanted soda. Throughout the assessment, he would make sexually inappropriate comments saying provider is a biscuit.  He reports having a manic episode in the past 10 years ago in which he reports elevated mood (stating he felt like God), elevated energy, distractibility, impulsivity, flight of ideas, minimal  sleep,and increased amount of speech. Patient reports paranoia about the government, stating "they want something from me, do you know what it is".  Chart review:  Per ED psych consult note on 10/16, Steven Richardson is a 42 y.o. male patient brought in under IVC after telling a judge that was presiding over his protective order violation that he was going to rape her. He has a history of schizoaffective disorder, DM2 and obesity.   Upon reevaluation, engagement largely characterized by continued elevated and atypical interpersonal style, significant irrelevant and thought disorganization, responding to internal stimuli, grossly intact orientation, limited ability to participate in meaningful assessment, odd and inappropriate affect, with odd and expansive mood.   Patient tells me upon evaluation that he has no problems with sleeping, does not need any medications, and that he is eating very poorly, states, "my sweets intake is too low, therefore I am eating no good".  Patient endorsed that he is tolerating his medications, but reiterates again that he does not need them, states "yes, yes, I tolerate them, but no, no, I do not need them". Patient formally asked if he was experiencing auditory and/or visual hallucinations, to which he stated he was not, appreciably during this time became severely internally preoccupied and oddly related briefly.  Per [facility] note on 10/3, IVC petition indicates that forensic provider Noreene Larsson had recommended the patient be transported here for psychiatric evaluation.  Per report from security staff patient has been discharged from jail and was brought here for mental health evaluation.  Patient has been disorganized with his thoughts and threatening to rape a judge and that was the reason  he was incarcerated.  However it is unclear as to whether or not patient has been taking his medication while he was in jail as he is unwilling to disclose this information when asked several times.   Patient is going back and forth from speaking in Arabic, speaking in a nonsensical manner, and [place]. When this writer initially walks up to him he states "Hey baby come sit with me".  When this writer began to ask him questions about his mental health he begins to speak initially Arabic and then transitions to nonsensical language.  He did at some point tell this Clinical research associate that he is hearing hallucinations but when asked what the hallucinations were he refused to disclose this information.  He also urinated upon entering the facility on the floor.  He appears disheveled and smells of urine. He is mildly agitated and is pushing and pulling the table in the exam room and required redirection by security frequently during interview.  He making kissing gestures intermittently during interview.  Psychiatric medications prior to admission: Per chart review, patient's last psychiatric hospitalization was May 04, 2023 and patient was discharged on the following medications Haloperidol 10 mg tablet BID,  hydroxyzine pamoate 50 mg capsule, TID PRN, lithium carbonate 900 mg ER tablet every 12 hours, and Trazodone  50 mg tablet Insomnia.  It is unclear when the patient last took his medications.  I attempted to call patient's wife but no answer, left confidential voicemail.   Past Psychiatric History:  Previous Psych Diagnoses: schizoaffective disorder bipolar type Prior psychiatric treatment: per chart review, Risperdal, Seroquel, Zyprexa Psychiatric medication compliance history: unclear  Current psychiatric treatment: Haldol, lithium Current psychiatrist: Dr. Catha Gosselin in [place] Current therapist: Denies  Previous hospitalizations per chart review, 5 previous hospitalization, most recently in June of this year for SI and psychosis History of suicide attempts: Once in 2005 where he jumped off a store History of self harm: Denies  Substance Use History: Alcohol: Denies  --------  Tobacco: stopped 4  months ago and previously smoking 2 packs daily for 1.5 years and prior to that, smokes 1 pack daily Marijuana: Denies Cocaine: Denies Methamphetamines: Denies Ecstasy: Denies Opiates: Denies Benzodiazepines: Denies Prescribed Meds abuse: Denies  History of Detox / Rehab: Denies  Is the patient at risk to self? Yes Has the patient been a risk to self in the past 6 months? Yes Has the patient been a risk to self within the distant past? Yes Is the patient a risk to others? Yes Has the patient been a risk to others in the past 6 months? Yes Has the patient been a risk to others within the distant past? No  Alcohol Screening: Patient refused Alcohol Screening Tool: Yes 1. How often do you have a drink containing alcohol?: Never 2. How many drinks containing alcohol do you have on a typical day when you are drinking?: 1 or 2 3. How often do you have six or more drinks on one occasion?: Never AUDIT-C Score: 0 4. How often during the last year have you found that you were not able to stop drinking once you had started?: Never 5. How often during the last year have you failed to do what was normally expected from you because of drinking?: Never 6. How often during the last year have you needed a first drink in the morning to get yourself going after a heavy drinking session?: Never 7. How often during the last year have you had a feeling of guilt of  remorse after drinking?: Never 8. How often during the last year have you been unable to remember what happened the night before because you had been drinking?: Never 9. Have you or someone else been injured as a result of your drinking?: No 10. Has a relative or friend or a doctor or another health worker been concerned about your drinking or suggested you cut down?: No Alcohol Use Disorder Identification Test Final Score (AUDIT): 0 Tobacco Screening:    Substance Abuse History in the last 12 months: Yes  Past Medical/Surgical History:   Medical Diagnoses: DM Home Rx: Denies Prior Surgeries / non-head trauma: right wrist needed stitches in 2005 from punching glass  Head trauma: Denies Seizures: Denies   Allergies: patient endorses no known allergies to medications  Family History:  Medical: Denies Psych: Denies Psych Rx: Denies Suicide: Denies Substance use family hx: Denies  Social History:  Place of birth and grew up where: [place] Marital Status: Single Children: 4 grown children 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, on Camera operator: some college Housing: unhoused (conflicting bc also states he lives in a home in [place]) Legal: recently went to jail Weapons: denies  Lab Results:  Results for orders placed or performed during the hospital encounter of 09/04/23 (from the past 48 hour(s))  Comprehensive metabolic panel     Status: Abnormal   Collection Time: 09/16/23  2:18 PM  Result Value Ref Range   Sodium 137 135 - 145 mmol/L   Potassium 3.8 3.5 - 5.1 mmol/L   Chloride 104 98 - 111 mmol/L   CO2 25 22 - 32 mmol/L   Glucose, Bld 115 (H) 70 - 99 mg/dL    Comment: Glucose reference range applies only to samples taken after fasting for at least 8 hours.   BUN 10 6 - 20 mg/dL   Creatinine, Ser 0.86 0.61 - 1.24 mg/dL   Calcium 9.5 8.9 - 57.8 mg/dL   Total Protein 6.3 (L) 6.5 - 8.1 g/dL   Albumin 3.3 (L) 3.5 - 5.0 g/dL   AST 17 15 - 41 U/L   ALT 22 0 - 44 U/L   Alkaline Phosphatase 66 38 - 126 U/L   Total Bilirubin 1.0 0.3 - 1.2 mg/dL   GFR, Estimated >46 >96 mL/min    Comment: (NOTE) Calculated using the CKD-EPI Creatinine Equation (2021)    Anion gap 8 5 - 15    Comment: Performed at [facility], [address], [place], [place] [address]    Blood Alcohol level:  Lab Results  Component Value Date   [facility] <10 09/04/2023   ETH <10 03/18/2020    Metabolic Disorder Labs:  Lab Results  Component Value Date   HGBA1C 5.0 03/24/2020   MPG 96.8 03/24/2020   No results found for:  "PROLACTIN" No results found for: "CHOL", "TRIG", "HDL", "CHOLHDL", "VLDL", "LDLCALC"  Current Medications: Current Facility-Administered Medications  Medication Dose Route Frequency Provider Last Rate Last Admin   acetaminophen (TYLENOL) tablet 650 mg  650 mg Oral Q6H PRN Eligha Bridegroom, NP       alum & mag hydroxide-simeth (MAALOX/MYLANTA) 200-200-20 MG/5ML suspension 30 mL  30 mL Oral Q4H PRN Eligha Bridegroom, NP       diphenhydrAMINE (BENADRYL) capsule 50 mg  50 mg Oral Q6H PRN Eligha Bridegroom, NP       Or   diphenhydrAMINE (BENADRYL) injection 50 mg  50 mg Intramuscular Q6H PRN Eligha Bridegroom, NP       haloperidol (HALDOL) tablet 10  mg  10 mg Oral BID Eligha Bridegroom, NP   10 mg at 09/17/23 0840   haloperidol (HALDOL) tablet 5 mg  5 mg Oral Q6H PRN Eligha Bridegroom, NP       Or   haloperidol lactate (HALDOL) injection 5 mg  5 mg Intramuscular Q6H PRN Eligha Bridegroom, NP       hydrOXYzine (ATARAX) tablet 25 mg  25 mg Oral TID PRN Eligha Bridegroom, NP       lithium carbonate (ESKALITH) ER tablet 900 mg  900 mg Oral Q12H Eligha Bridegroom, NP   900 mg at 09/17/23 0840   LORazepam (ATIVAN) tablet 2 mg  2 mg Oral Q6H PRN Eligha Bridegroom, NP       Or   LORazepam (ATIVAN) injection 2 mg  2 mg Intramuscular Q6H PRN Eligha Bridegroom, NP       magnesium hydroxide (MILK OF MAGNESIA) suspension 15 mL  15 mL Oral Daily PRN Eligha Bridegroom, NP       QUEtiapine (SEROQUEL) tablet 50 mg  50 mg Oral QHS Eligha Bridegroom, NP   50 mg at 09/16/23 2031   traZODone (DESYREL) tablet 50 mg  50 mg Oral QHS PRN Eligha Bridegroom, NP   50 mg at 09/16/23 2205    PTA Medications: Medications Prior to Admission  Medication Sig Dispense Refill Last Dose   benztropine (COGENTIN) 1 MG tablet Take 1 tablet (1 mg total) by mouth 2 (two) times daily. (Patient not taking: Reported on 09/05/2023) 60 tablet 0    divalproex (DEPAKOTE ER) 500 MG 24 hr tablet Take 2 tablets (1,000 mg total) by mouth 2 (two) times  daily. (Patient not taking: Reported on 09/05/2023) 120 tablet 0    doxycycline (VIBRAMYCIN) 100 MG capsule Take 1 capsule (100 mg total) by mouth 2 (two) times daily. (Patient not taking: Reported on 09/05/2023) 14 capsule 0    fluconazole (DIFLUCAN) 200 MG tablet Take 200 mg by mouth once a week. For 4 weeks. (Patient not taking: Reported on 09/05/2023)      hydrochlorothiazide (HYDRODIURIL) 25 MG tablet Take 25 mg by mouth in the morning.      lithium carbonate (LITHOBID) 300 MG CR tablet Take one tablet (300 mg) by mouth every morning and two tablets (600 mg) by mouth at bedtime. (Patient not taking: Reported on 09/05/2023) 90 tablet 0    OLANZapine (ZYPREXA) 15 MG tablet Take 15 mg by mouth in the morning and at bedtime.      propranolol (INDERAL) 20 MG tablet Take 1 tablet (20 mg total) by mouth 2 (two) times daily. (Patient not taking: Reported on 09/05/2023) 60 tablet 0    QUEtiapine (SEROQUEL) 400 MG tablet Take 1 tablet (400 mg total) by mouth at bedtime for 14 days. (Patient not taking: Reported on 09/05/2023) 14 tablet 0    QUEtiapine (SEROQUEL) 50 MG tablet Take 1 tablet (50 mg total) by mouth daily. (Patient not taking: Reported on 09/05/2023) 30 tablet 0    risperiDONE (RISPERDAL) 2 MG tablet Take 1.5 tablets (3 mg total) by mouth at bedtime. (Patient not taking: Reported on 09/05/2023) 45 tablet 0      Physical Findings: AIMS: No  CIWA:    COWS:     Mental Status Exam  General Appearance: appears older than stated age, disheveled, in casual attire  Behavior: hypersexual, cooperative  Psychomotor Activity: no psychomotor agitation or retardation noted   Eye Contact: fair  Speech: some thought blocking   Mood: euthymic  Affect: congruent,  pleasant and interactive   Thought Process: disorganized Descriptions of Associations: intact   Thought Content Hallucinations: denies AH, VH , does not appear responding to stimuli  Delusions: paranoia present Suicidal Thoughts: denies  SI, intention, plan  Homicidal Thoughts: denies HI, intention, plan   Alertness/Orientation: alert and fully oriented   Insight: limited Judgment: limited  Memory: poor  Executive Functions  Concentration: intact  Attention Span: fair  Recall: intact  Fund of Knowledge: limited  Physical Exam  Constitutional:      Appearance: Normal appearance.  Cardiovascular:     Rate and Rhythm: Normal rate.  Pulmonary:     Effort: Pulmonary effort is normal.  Neurological:     General: No focal deficit present.     Mental Status: Alert and oriented to person, place, and time.    Review of Systems  No reported symptoms  Blood pressure 92/62, pulse 79, temperature 98.3 F (36.8 C), temperature source Oral, resp. rate 20, SpO2 100%. There is no height or weight on file to calculate BMI.   Treatment Plan Summary: Daily contact with patient to assess and evaluate symptoms and progress in treatment and medication management  ASSESSMENT: Steven Richardson is a 42 y.o., male with a past psychiatric history significant for schizoaffective disorder bipolar type who presents to the [facility] Involuntary from [facility] Emergency Department for evaluation and management of psychosis.   PLAN: Safety and Monitoring:  -- Involuntary admission to inpatient psychiatric unit for safety, stabilization and treatment  -- Daily contact with patient to assess and evaluate symptoms and progress in treatment  -- Patient's case to be discussed in multi-disciplinary team meeting  -- Observation Level : q15 minute checks  -- Vital signs:  q12 hours  -- Precautions: suicide, elopement, and assault  2. Medications:    Psychiatric Diagnosis and Treatment Schizoaffective disorder, bipolar type -Continue lithium ER 900 mg q12h for mood stabilization  -Level on 10/8 is 0.85  -Continue Haldol 10 mg BID for psychosis Trazodone 50 mg at bedtime as needed for insomnia Atarax 25 mg TID as needed  for anxiety Agitation Protocol: Haldol/Ativan/Benadryl  Medical Diagnosis and Treatment DM-f/u with PCP  Patient does not need nicotine replacement  Other as needed medications  Tylenol 650 mg every 6 hours as needed for pain Mylanta 30 mL every 4 hours as needed for indigestion Milk of magnesia 30 mL daily as needed for constipation    The risks/benefits/side-effects/alternatives to the above medication were discussed in detail with the patient and time was given for questions. The patient consents to medication trial. FDA black box warnings, if present, were discussed.  The patient is agreeable with the medication plan, as above. We will monitor the patient's response to pharmacologic treatment, and adjust medications as necessary.  3. Routine and other pertinent labs: EKG monitoring: QTc: 427 on 10/15  Metabolism / endocrine: BMI: There is no height or weight on file to calculate BMI.   CBC: WNL CMP: gluc 115 TSH: WNL A1c: pending Lipid panel: unremarkable UDS: negative Ethanol: <10 UA: pending  4. Group Therapy:  -- Encouraged patient to participate in unit milieu and in scheduled group therapies   -- Short Term Goals: Ability to identify changes in lifestyle to reduce recurrence of condition will improve, Ability to verbalize feelings will improve, Ability to disclose and discuss suicidal ideas, Ability to demonstrate self-control will improve, Ability to identify and develop effective coping behaviors will improve, Ability to maintain clinical measurements within normal limits will  improve, Compliance with prescribed medications will improve, and Ability to identify triggers associated with substance abuse/mental health issues will improve  -- Long Term Goals: Improvement in symptoms so as ready for discharge -- Patient is encouraged to participate in group therapy while admitted to the psychiatric unit. -- We will address other chronic and acute stressors, which  contributed to the patient's Schizoaffective disorder, bipolar type (HCC) in order to reduce the risk of self-harm at discharge.  5. Discharge Planning:   -- Social work and case management to assist with discharge planning and identification of hospital follow-up needs prior to discharge  -- Estimated LOS: 5-7 days  -- Discharge Concerns: Need to establish a safety plan; Medication compliance and effectiveness  -- Discharge Goals: Return home with outpatient referrals for mental health follow-up including medication management/psychotherapy  I certify that inpatient services furnished can reasonably be expected to improve the patient's condition.    I discussed my assessment, planned testing and intervention for the patient with Dr. Enedina Finner who agrees with my formulated course of action.   Lance Muss, MD, PGY-2 10/16/20249:53 AM

## 2023-09-17 NOTE — Progress Notes (Signed)
   09/17/23 1132  Psych Admission Type (Psych Patients Only)  Admission Status Involuntary  Psychosocial Assessment  Patient Complaints None  Eye Contact Fair  Facial Expression Animated  Affect Silly  Speech Soft  Interaction Minimal  Motor Activity Other (Comment) (WDL)  Appearance/Hygiene Unremarkable  Behavior Characteristics Cooperative  Mood Silly;Pleasant  Thought Process  Coherency Loose associations  Content Preoccupation  Delusions None reported or observed  Perception WDL  Hallucination None reported or observed  Judgment Poor  Confusion None  Danger to Self  Current suicidal ideation? Denies  Agreement Not to Harm Self Yes  Description of Agreement Verbal  Danger to Others  Danger to Others None reported or observed

## 2023-09-17 NOTE — [facility] Suicide Risk Assessment (Signed)
Suicide Risk Assessment  Admission Assessment    [facility] Admission Suicide Risk Assessment   Nursing information obtained from:    Demographic factors:  Male Current Mental Status:  NA Loss Factors:  NA Historical Factors:  NA Risk Reduction Factors:  NA  Total Time spent with patient: 45 minutes Principal Problem: Schizoaffective disorder, bipolar type (HCC) Diagnosis:  Principal Problem:   Schizoaffective disorder, bipolar type (HCC)   Subjective Data:   Steven Richardson is a 42 y.o., male with a past psychiatric history significant for schizoaffective disorder bipolar type who presents to the [facility] Involuntary from [facility] Emergency Department for evaluation and management of psychosis.    Initial assessment on 10/16, patient was evaluated on the inpatient unit, the patient reports feeling like he is in a "cycle of IVC". When I asked him why he is IVCed, he was unsure. He states that he cussed out the judge in court. When I asked him why he is in court, he was unsure. Patient states that he was previously on lithium in which he felt has been helpful. When I asked him what has been occurring prior to hospitalization, he was not sure. He denies having stressors at this time but in the past, he felt overwhelmed from moving from [place] to [place] about 20 years ago.    He denies SI, HI, and AVH currently. He reports previously having AH in 2013 and states "I would dance" and he started dancing. He denies symptoms of depression and states his appetite are good. He reports minimally sleeping, stating he sleeps 1-2 hours daily which for him is "normal" He denied anhedonia, feelings of guilt, low energy or concentration, and psychomotor changes. He states that he feels anxious because he wanted soda. Throughout the assessment, he would make sexually inappropriate comments saying provider is a biscuit.   He reports having a manic episode in the past 10 years ago in which he  reports elevated mood (stating he felt like God), elevated energy, distractibility, impulsivity, flight of ideas, minimal sleep,and increased amount of speech. Patient reports paranoia about the government, stating "they want something from me, do you know what it is".  Continued Clinical Symptoms:  Alcohol Use Disorder Identification Test Final Score (AUDIT): 0 The "Alcohol Use Disorders Identification Test", Guidelines for Use in Primary Care, Second Edition.  World Science writer [facility]). Score between 0-7:  no or low risk or alcohol related problems. Score between 8-15:  moderate risk of alcohol related problems. Score between 16-19:  high risk of alcohol related problems. Score 20 or above:  warrants further diagnostic evaluation for alcohol dependence and treatment.   CLINICAL FACTORS:   Schizophrenia:   Paranoid or undifferentiated type   Musculoskeletal: Strength & Muscle Tone: within normal limits Gait & Station: normal Patient leans: N/A  Psychiatric Specialty Exam:  General Appearance: appears older than stated age, disheveled, in casual attire   Behavior: hypersexual, cooperative   Psychomotor Activity: no psychomotor agitation or retardation noted    Eye Contact: fair  Speech: some thought blocking     Mood: euthymic  Affect: congruent, pleasant and interactive    Thought Process: disorganized Descriptions of Associations: intact    Thought Content Hallucinations: denies AH, VH , does not appear responding to stimuli  Delusions: paranoia present Suicidal Thoughts: denies SI, intention, plan  Homicidal Thoughts: denies HI, intention, plan    Alertness/Orientation: alert and fully oriented    Insight: limited Judgment: limited   Memory: poor  Executive Functions  Concentration: intact  Attention Span: fair  Recall: intact  Fund of Knowledge: limited   Physical Exam  Constitutional:      Appearance: Normal appearance.  Cardiovascular:      Rate and Rhythm: Normal rate.  Pulmonary:     Effort: Pulmonary effort is normal.  Neurological:     General: No focal deficit present.     Mental Status: Alert and oriented to person, place, and time.      Review of Systems  No reported symptoms   COGNITIVE FEATURES THAT CONTRIBUTE TO RISK:  Closed-mindedness    SUICIDE RISK:  Mild:  Suicidal ideation of limited frequency, intensity, duration, and specificity.  There are no identifiable plans, no associated intent, mild dysphoria and related symptoms, good self-control (both objective and subjective assessment), few other risk factors, and identifiable protective factors, including available and accessible social support.  PLAN OF CARE: See H&P for assessment and plan.   I certify that inpatient services furnished can reasonably be expected to improve the patient's condition.   Lance Muss, MD 09/17/2023, 2:33 PM

## 2023-09-17 NOTE — [facility] Group Notes (Signed)
Adult Psychoeducational Group Note  Date:  09/17/2023 Time:  8:30 PM  Group Topic/Focus:  Wrap-Up Group:   The focus of this group is to help patients review their daily goal of treatment and discuss progress on daily workbooks.  Participation Level:  Active  Participation Quality:  Appropriate  Affect:  Appropriate  Cognitive:  Appropriate  Insight: Appropriate  Engagement in Group:  Engaged  Modes of Intervention:  Discussion  Additional Comments:   Pt states that he had a "perfect day" and was finally able to speak with the doctor and get some information about why he's here. Pt states he's wanting to stay on his medication when he leaves because it helps him. Pt states he's been having some trouble sleeping during the night. Pt rates his anxiety at a 4.    Vevelyn Pat 09/17/2023, 8:30 PM

## 2023-09-17 NOTE — Progress Notes (Signed)
Recreation Therapy Notes  INPATIENT RECREATION THERAPY ASSESSMENT  Patient Details Name: Steven Richardson MRN: [id number] DOB: 08-Dec-1980 Today's Date: 09/17/2023       Information Obtained From: Patient  Able to Participate in Assessment/Interview: Yes  Patient Presentation: Alert (Pt was very flirtatious, blowing kisses. Pt needed repeated redirection.)  Reason for Admission (Per Patient): Other (Comments) ("needed help")  Patient Stressors: Other (Comment) (wants to get married, looking for a wife)  Coping Skills:   TV, Music, Exercise, Dance (Pt asked LRT to dance for him to which LRT told pt no. LRT also told pt she can't dance and has no rhythm in response to pt persistence. Pt was able to be redirected back to the questions at hand.)  Leisure Interests (2+):  Music - Listen, Music - Other (Comment) (Dancing)  Frequency of Recreation/Participation: Other (Comment) (every now and then)  Awareness of Community Resources:   ("I had when I had my freedom". "I have a case I need to settle in [employer] court".)  Expressed Interest in [employer] Information: No  [place] of Residence:  [place]  Patient Main Form of Transportation: Set designer ("someone has it"; pt wants someone to help him find his car)  Patient Strengths:  Dancing  Patient Identified Areas of Improvement:  "teach you (LRT) to dance better"  Patient Goal for Hospitalization:  "I don't know, they told me in 7 days I'm out of here".  Current SI (including self-harm):  No  Current HI:  No  Current AVH: No  Staff Intervention Plan: Group Attendance, Collaborate with Interdisciplinary Treatment Team  Consent to Intern Participation: N/A   Dezyrae Kensinger-McCall, LRT,CTRS Gerlean Cid A Shashank Kwasnik-McCall 09/17/2023, 1:09 PM

## 2023-09-17 NOTE — Progress Notes (Signed)

## 2023-09-17 NOTE — [facility] Group Notes (Signed)
Adult Psychoeducational Group Note  Date:  09/17/2023 Time:  9:48 AM  Group Topic/Focus:  Goals Group:   The focus of this group is to help patients establish daily goals to achieve during treatment and discuss how the patient can incorporate goal setting into their daily lives to aide in recovery. Orientation:   The focus of this group is to educate the patient on the purpose and policies of crisis stabilization and provide a format to answer questions about their admission.  The group details unit policies and expectations of patients while admitted.  Participation Level:  Active  Participation Quality:  Appropriate  Affect:  Appropriate  Cognitive:  Appropriate  Insight: Appropriate  Engagement in Group:  Engaged  Modes of Intervention:  Discussion  Additional Comments:  Pt attended the goals group and remained appropriate and engaged throughout the duration of the group.   Sheran Lawless 09/17/2023, 9:48 AM
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
## 2023-09-18 LAB — HEMOGLOBIN A1C
Hgb A1c MFr Bld: 5.3 % (ref 4.8–5.6)
Mean Plasma Glucose: 105.41 mg/dL

## 2023-09-18 NOTE — Plan of Care (Signed)

## 2023-09-18 NOTE — Group Note (Signed)
Recreation Therapy Group Note   Group Topic:Leisure Education  Group Date: 09/18/2023 Start Time: 1000 End Time: 1038 Facilitators: Kaelea Gathright-McCall, LRT,CTRS Location: [address] Dayroom   Group Topic: Leisure Education  Goal Area(s) Addresses:  Patient will identify positive leisure activities.  Patient will identify one positive benefit of participation in leisure activities.   Intervention: Leisure Group Game  Group Description: Patient, MHT, and LRT participated in playing a trivia game of Lyrically Correct. In this game, participants had to complete the lyrics to the songs. Participants got a point for each answer they got correct. LRT debriefed on what leisure is, what examples of leisure activities are, where you can participate in leisure and why leisure is important.   Education:  Leisure Exposure, Pharmacist, community, Discharge Planning  Education Outcome: Acknowledges education/In group clarification offered/Needs additional education   Affect/Mood: Flat   Participation Level: Moderate   Participation Quality: Independent   Behavior: Appropriate   Speech/Thought Process: Barely audible    Insight: Limited   Judgement: Limited   Modes of Intervention: Competitive Play   Patient Response to Interventions:  Attentive and Receptive   Education Outcome:  In group clarification offered    Clinical Observations/Individualized Feedback: Pt was attentive and attempted to answer a few of the questions throughout the game. Pt was appropriate during group session.     Plan: Continue to engage patient in RT group sessions 2-3x/week.   Steven Richardson, LRT,CTRS  09/18/2023 11:23 AM

## 2023-09-18 NOTE — [facility] Group Notes (Signed)
Adult Psychoeducational Group Note  Date:  09/18/2023 Time:  8:26 PM  Group Topic/Focus:  Wrap-Up Group:   The focus of this group is to help patients review their daily goal of treatment and discuss progress on daily workbooks.  Participation Level:  Active  Participation Quality:  Appropriate  Affect:  Appropriate  Cognitive:  Appropriate  Insight: Appropriate  Engagement in Group:  Engaged  Modes of Intervention:  Discussion and Support  Additional Comments:  Pt told that today was a good day on the unit, the highlights of which were playing basketball in the courtyard and enjoying meals in the cafeteria. On the subject of staying well upon discharge, Pt mentioned planning to take his medications as prescribed. Pt rated his day a 7 out of 10.  Steven Richardson 09/18/2023, 8:26 PM

## 2023-09-18 NOTE — Progress Notes (Signed)
[facility] MD Progress Note  09/18/2023 11:01 AM Steven Richardson  MRN:  [id number]   Reason for Admission:  Steven Richardson is a 42 y.o. male with a history of schizoaffective disorder bipolar type, who was initially admitted for inpatient psychiatric hospitalization on 09/16/2023 for management of psychosis. The patient is currently on Hospital Day 2.   Chart Review from last 24 hours:  The patient's chart was reviewed and nursing notes were reviewed. Vital signs: normal. The patient's case was discussed in multidisciplinary team meeting. Per [facility], patient was taking medications appropriately. Patient received the following PRN medications: trazodone 50 mg x1. Per nursing, patient is "silly and pleasant" and attended Recreation group therapy, comprehensive assessment, and psychoeducational group.  Information Obtained Today During Patient Interview: The patient was seen and evaluated on the unit. On assessment today the patient reports good mood for the past 24 hours. He attended several group sessions and a consult with good guidance. He is very goal oriented in securing housing when he leaves the facility. His goal is to leave in 5-6 days so he can continue to attend court orders.  He endorses good sleep. He usually sleeps 3-4 hours a night, which is compensated with several naps throughout the day. He has good appetite and has been taking his meals. He states his health feels like "9/10" with good adherence to his medication regimen - lithium and haldol. He denies s/e such as nausea, vomiting, muscle/joint stiffness, or akathisia. We had a short discussion regarding switching over his medication to LAI but he was unfavorable to the idea. He wishes to stay on PO med. He denies SI/HI/AVH.     Principal Problem: Schizoaffective disorder, bipolar type (HCC) Diagnosis: Principal Problem:   Schizoaffective disorder, bipolar type (HCC)    Past Psychiatric History:  Previous Psych Diagnoses: schizoaffective  disorder bipolar type Prior psychiatric treatment: per chart review, Risperdal, Seroquel, Zyprexa Psychiatric medication compliance history: unclear   Current psychiatric treatment: Haldol, lithium Current psychiatrist: Dr. Catha Gosselin in [place] Current therapist: Denies   Previous hospitalizations per chart review, 5 previous hospitalization, most recently in June of this year for SI and psychosis History of suicide attempts: Once in 2005 where he jumped off a store History of self harm: Denies   Substance Use History: Alcohol: Denies   --------   Tobacco: stopped 4 months ago and previously smoking 2 packs daily for 1.5 years and prior to that, smokes 1 pack daily Marijuana: Denies Cocaine: Denies Methamphetamines: Denies Ecstasy: Denies Opiates: Denies Benzodiazepines: Denies Prescribed Meds abuse: Denies   History of Detox / Rehab: Denies  Past Medical History:  Past Medical History:  Diagnosis Date   Constipation    Diabetes mellitus without complication (HCC)    Paranoid schizophrenia (HCC)     Past Surgical History:  Procedure Laterality Date   NO PAST SURGERIES     Family History:  Family History  Problem Relation Age of Onset   Healthy Mother    Diabetes Mother    Healthy Father    Colon cancer Neg Hx    Esophageal cancer Neg Hx    Stomach cancer Neg Hx    Pancreatic cancer Neg Hx    Family Psychiatric History:  None reported Social History: Born and raised in [place]. Married and divorced; has 4 grown children. Unemployed currently and living on social security. He states he lived at an apartment in [facility], but he may lose it because it wasn't leased under his name and he was vacant  for a while. Per patient, he recently went to jail and needs to follow up at a court twice a month (2nd, 4th Thursday of the month) with urine drug screen.    Current Medications: Current Facility-Administered Medications  Medication Dose Route Frequency Provider  Last Rate Last Admin   acetaminophen (TYLENOL) tablet 650 mg  650 mg Oral Q6H PRN Eligha Bridegroom, NP       alum & mag hydroxide-simeth (MAALOX/MYLANTA) 200-200-20 MG/5ML suspension 30 mL  30 mL Oral Q4H PRN Eligha Bridegroom, NP       diphenhydrAMINE (BENADRYL) capsule 50 mg  50 mg Oral Q6H PRN Eligha Bridegroom, NP       Or   diphenhydrAMINE (BENADRYL) injection 50 mg  50 mg Intramuscular Q6H PRN Eligha Bridegroom, NP       haloperidol (HALDOL) tablet 10 mg  10 mg Oral BID Eligha Bridegroom, NP   10 mg at 09/18/23 1610   haloperidol (HALDOL) tablet 5 mg  5 mg Oral Q6H PRN Eligha Bridegroom, NP       Or   haloperidol lactate (HALDOL) injection 5 mg  5 mg Intramuscular Q6H PRN Eligha Bridegroom, NP       hydrOXYzine (ATARAX) tablet 25 mg  25 mg Oral TID PRN Eligha Bridegroom, NP       lithium carbonate (ESKALITH) ER tablet 900 mg  900 mg Oral Q12H Eligha Bridegroom, NP   900 mg at 09/18/23 9604   LORazepam (ATIVAN) tablet 2 mg  2 mg Oral Q6H PRN Eligha Bridegroom, NP       Or   LORazepam (ATIVAN) injection 2 mg  2 mg Intramuscular Q6H PRN Eligha Bridegroom, NP       magnesium hydroxide (MILK OF MAGNESIA) suspension 15 mL  15 mL Oral Daily PRN Eligha Bridegroom, NP       traZODone (DESYREL) tablet 50 mg  50 mg Oral QHS PRN Eligha Bridegroom, NP   50 mg at 09/17/23 2034    Lab Results:  Results for orders placed or performed during the hospital encounter of 09/16/23 (from the past 48 hour(s))  Urinalysis, Routine w reflex microscopic -Urine, Clean Catch     Status: Abnormal   Collection Time: 09/17/23 12:44 PM  Result Value Ref Range   Color, Urine STRAW (A) YELLOW   APPearance CLEAR CLEAR   Specific Gravity, Urine 1.004 (L) 1.005 - 1.030   pH 6.0 5.0 - 8.0   Glucose, UA NEGATIVE NEGATIVE mg/dL   Hgb urine dipstick NEGATIVE NEGATIVE   Bilirubin Urine NEGATIVE NEGATIVE   Ketones, ur NEGATIVE NEGATIVE mg/dL   Protein, ur NEGATIVE NEGATIVE mg/dL   Nitrite NEGATIVE NEGATIVE   Leukocytes,Ua NEGATIVE  NEGATIVE    Comment: Performed at [facility], [address], [place], [place] [address]    Blood Alcohol level:  Lab Results  Component Value Date   ETH <10 09/04/2023   ETH <10 03/18/2020    Metabolic Disorder Labs: Lab Results  Component Value Date   HGBA1C 5.0 03/24/2020   MPG 96.8 03/24/2020   No results found for: "PROLACTIN" No results found for: "CHOL", "TRIG", "HDL", "CHOLHDL", "VLDL", "LDLCALC"   Musculoskeletal: Strength & Muscle Tone: within normal limits Gait & Station: normal Patient leans: N/A  Psychiatric Specialty Exam:  General Appearance: appears at stated age, casually dressed and groomed  Behavior: pleasant and cooperative  Psychomotor Activity: no psychomotor agitation or retardation noted  Eye Contact: fair  Speech: normal amount, tone, volume and fluency  Mood:  euthymic  Affect: congruent, pleasant and interactive  Thought Process: linear, goal directed, no circumstantial or tangential thought process noted, no racing thoughts or flight of ideas. He was fixated on finding a place to go when he gets discharged.  Descriptions of Associations: intact  Thought Content: no bizarre content, logical and future-oriented  Hallucinations: denies AH, VH , does not appear responding to stimuli  Delusions: no paranoia, delusions of control, grandeur, ideas of reference, thought broadcasting, and magical thinking  Suicidal Thoughts: denies SI, intention, plan  Homicidal Thoughts: denies HI, intention, plan  Alertness/Orientation: alert and fully oriented  Insight: limited  Judgment: limited  Memory: impaired  Executive Functions  Concentration: intact  Attention Span: fair Recall: intact Fund of Knowledge: fair    Assets  Desire for Improvement    Physical Exam: Constitutional:      Appearance: Normal appearance.  Cardiovascular:     Rate and Rhythm: Normal rate.  Pulmonary:     Effort: Pulmonary effort  is normal.  Neurological:     General: No focal deficit present.     Mental Status: Alert and oriented to person, place, and time.    Review of Systems  No reported symptoms.   ASSESSMENT:  Steven Richardson is a 42 y/o M with hx of schizoaffective disorder bipolar type who presents to the [facility] involuntarily from [facility] Department for evaluation and management of psychosis.  Diagnoses / Active Problems: Principal Problem: Schizoaffective disorder, bipolar type (HCC) Diagnosis: Principal Problem:   Schizoaffective disorder, bipolar type (HCC)   PLAN: Safety and Monitoring: - Involuntary admission to inpatient psychiatric unit for safety, stabilization and treatment - Daily contact with patient to assess and evaluate symptoms and progress in treatment - Patient's case to be discussed in multi-disciplinary team meeting - Observation Level : q15 minute checks - Vital signs:  q12 hours - Precautions: suicide, elopement, and assault   2. Medications:   The risks/benefits/side-effects/alternatives to this medication were discussed in detail with the patient and time was given for questions. The patient consents to medication trial. FDA block box warnings, if presents, were discussed.   Psychiatric Diagnosis and treatment - Continue lithium carbonate ER 900 mg BID (Lab on 10/8 normal at 0.85)  -Plan to obtain another lithium level in a few days - Continue Haldol 10 mg BID   Medical Diagnosis and treatment DM: doing well without medications. Will follow up w/ PCP. Last A1c in record was 6.3% in January 2024.  PRN Medications - Trazodone 50 mg at bedtime, Atarax 25 mg TID, Tylenol 650 mg q6H, Mylanta 30 ml q4H, Magnesium milk 30 ml daily - Agitation Protocol: Haldol, Ativan, Benadryl   3. Pertinent labs:  EKG: QTc 427  (09/16/2023)  CBC:  normal  (09/04/2023) CMP:  BG 115, otherwise unremarkable (09/16/2023) TSH:  normal  (05/03/2023) A1c:  ordered  today; last check 6.3% (01/01/2023) Lipid panel:  normal  (05/03/2023) UDS:  none detected  (09/04/2023) Ethanol:  <10  (05/02/2023) UA:  unremarkable  (09/17/2023)   4. Group and Therapy: - Encouraged patient to participate in unit milieu and in scheduled group therapies  - Short Term Goals: Ability to identify changes in lifestyle to reduce recurrence of condition will improve, Ability to verbalize feelings will improve, Ability to disclose and discuss suicidal ideas, Ability to demonstrate self-control will improve, Ability to identify and develop effective coping behaviors will improve, Ability to maintain clinical measurements within normal limits will improve, Compliance with prescribed medications will  improve, and Ability to identify triggers associated with substance abuse/mental health issues will improve - Long Term Goals: Improvement in symptoms so as ready for discharge   5. Discharge Planning:  - Social work and case management to assist with discharge planning and identification of hospital follow-up needs prior to discharge - Estimated LOS: 5-7 days - Discharge Concerns: Need to establish a safety plan; Medication compliance and effectiveness - Discharge Goals: Return home with outpatient referrals for mental health follow-up including medication management/psychotherapy   I certify that inpatient services furnished can reasonably be expected to improve the patient's condition.     I discussed my assessment, planned testing and intervention for the patient with Dr. Enedina Finner who agrees with my formulated course of action.     Total Time Spent in Direct Patient Care:  I personally spent 20 minutes on the unit in direct patient care. The direct patient care time included face-to-face time with the patient, reviewing the patient's chart, communicating with other professionals, and coordinating care. Greater than 50% of this time was spent in counseling or coordinating care with the  patient regarding goals of hospitalization, psycho-education, and discharge planning needs.   Bo Mcclintock, Medical Student 09/18/2023, 11:01 AM   I personally was present and performed or re-performed the history and medical decision-making activities of this service and have verified that the service and findings are accurately documented in the student's note.  Kizzie Ide, MD, PGY-2

## 2023-09-18 NOTE — Group Note (Signed)
Date:  09/18/2023 Time:  1:51 PM  Group Topic/Focus:  Emotional Education:   The focus of this group is to discuss what feelings/emotions are, and how they are experienced.    Participation Level:  Active  Participation Quality:  Appropriate  Affect:  Appropriate  Cognitive:  Appropriate  Insight: Appropriate  Engagement in Group:  Engaged  Modes of Intervention:  Discussion and Education   Donell Beers 09/18/2023, 1:51 PM

## 2023-09-18 NOTE — Group Note (Signed)
Date:  09/18/2023 Time:  9:54 AM  Group Topic/Focus:  Goals Group:   The focus of this group is to help patients establish daily goals to achieve during treatment and discuss how the patient can incorporate goal setting into their daily lives to aide in recovery.    Participation Level:  Active  Participation Quality:  Appropriate  Affect:  Appropriate  Cognitive:  Alert  Insight: Appropriate  Engagement in Group:  Engaged  Modes of Intervention:  Discussion  Additional Comments:    Beckie Busing 09/18/2023, 9:54 AM

## 2023-09-18 NOTE — Progress Notes (Signed)
   09/18/23 1000  Psych Admission Type (Psych Patients Only)  Admission Status Involuntary  Psychosocial Assessment  Patient Complaints None  Eye Contact Brief  Facial Expression Flat  Affect Flat  Speech Soft  Interaction Minimal  Motor Activity Other (Comment)  Appearance/Hygiene Unremarkable  Behavior Characteristics Cooperative  Mood Pleasant  Thought Process  Coherency Disorganized  Content Preoccupation  Delusions None reported or observed  Perception WDL  Hallucination None reported or observed  Judgment Poor  Confusion None  Danger to Self  Current suicidal ideation? Denies  Danger to Others  Danger to Others None reported or observed   Dar Note: Patient presents with anxious affect and mood.  Denies suicidal thoughts, auditory and visual hallucinations but observed responding to internal stimuli.  Medications given as prescribed.  Patient visible in milieu for therapy and activities.  Routine safety checks maintained.  Vistaril 25 mg given for complain of anxiety with good effect.  Stated goal is "to find housing before discharge." Patient is safe on and off the unit.

## 2023-09-19 NOTE — Progress Notes (Signed)
   09/19/23 0600  15 Minute Checks  Location Bedroom  Visual Appearance Calm  Behavior Composed  Sleep (Behavioral Health Patients Only)  Calculate sleep? (Click Yes once per 24 hr at 0600 safety check) Yes  Documented sleep last 24 hours 8.75

## 2023-09-19 NOTE — [facility] Suicide Risk Assessment (Signed)
[facility] INPATIENT:  Family/Significant Other Suicide Prevention Education  Suicide Prevention Education:  Education Completed; 09-19-2023, Steven Richardson (Mother) @ [phone] has been identified by the patient as the family member/significant other with whom the patient will be residing, and identified as the person(s) who will aid the patient in the event of a mental health crisis (suicidal ideations/suicide attempt).  With written consent from the patient, the family member/significant other has been provided the following suicide prevention education, prior to the and/or following the discharge of the patient.  The suicide prevention education provided includes the following: Suicide risk factors Suicide prevention and interventions National Suicide Hotline telephone number [facility] assessment telephone number [facility] Emergency Assistance 911 [facility] and/or Residential Mobile Crisis Unit telephone number  Request made of family/significant other to: Remove weapons (e.g., guns, rifles, knives), all items previously/currently identified as safety concern.   Remove drugs/medications (over-the-counter, prescriptions, illicit drugs), all items previously/currently identified as a safety concern.  Steven Richardson (Mother) @ [phone]  verbalizes understanding of the suicide prevention education information provided.  The family member/significant other agrees to remove the items of safety concern listed above.  Shafer Swamy S Amalio Loe 09/19/2023, 2:59 PM

## 2023-09-19 NOTE — Progress Notes (Signed)
[facility] MD Progress Note  09/19/2023 10:01 AM Steven Richardson  MRN:  [id number]   Reason for Admission:  Steven Richardson is a 42 y.o. male with a history of schizoaffective disorder bipolar type, who was initially admitted for inpatient psychiatric hospitalization on 09/16/2023 for management of psychosis. The patient is currently on Hospital Day 3.   Chart Review from last 24 hours:  The patient's chart was reviewed and nursing notes were reviewed. Vital signs: normal. The patient's case was discussed in multidisciplinary team meeting. Per [facility], patient was taking medications appropriately. Patient received the following PRN medications: trazodone and hydroxyzine. Per nursing, patient was anxious but pleasant, and attended Recreation group therapy and psychoeducational group.  Information Obtained Today During Patient Interview: The patient was seen and evaluated on the unit. On assessment today the patient reports good mood overall, including notable improvement compared to yesterday. He attended group sessions and talked about focusing on good parts of the day. He was able to talk to his mom and uncle yesterday and found a place to stay after discharge, which was his biggest concern during the admission. He slept well through the night. Documented sleep time at 8.5 hours. He used to sleep 3-4 hours a night with several naps to catch up so this is a good improvement for him. He is adherent to medications (haldol, lithium) with notable improvements. He denies s/e such as nausea, vomiting, muscle or joint stiffness, or akathisia. Again, we discussed pros and cons of oral pills vs LAI. Pt showed confidence in staying compliant to oral medications. He denies SI/HI/AVH.   Information Obtained Today from Collateral: attempted to contact pt's mother at 10 am but couldn't reach. Will try later. Steven Richardson (mother, primary POC): [phone] Interpreter service (arabic): [phone]   Principal Problem:  Schizoaffective disorder, bipolar type (HCC) Diagnosis: Principal Problem:   Schizoaffective disorder, bipolar type (HCC)   Past Psychiatric History:  Previous Psych Diagnoses: schizoaffective disorder bipolar type Prior psychiatric treatment: per chart review, Risperdal, Seroquel, Zyprexa Psychiatric medication compliance history: unclear   Current psychiatric treatment: Haldol, lithium Current psychiatrist: Dr. Catha Gosselin in [place] Current therapist: Denies   Previous hospitalizations per chart review, 5 previous hospitalization, most recently in June of this year for SI and psychosis History of suicide attempts: Once in 2005 where he jumped off a store History of self harm: Denies   Substance Use History: Alcohol: Denies   --------   Tobacco: stopped 4 months ago and previously smoking 2 packs daily for 1.5 years and prior to that, smokes 1 pack daily Marijuana: Denies Cocaine: Denies Methamphetamines: Denies Ecstasy: Denies Opiates: Denies Benzodiazepines: Denies Prescribed Meds abuse: Denies   History of Detox / Rehab: Denies  Past Medical History:  Past Medical History:  Diagnosis Date   Constipation    Diabetes mellitus without complication (HCC)    Paranoid schizophrenia (HCC)     Past Surgical History:  Procedure Laterality Date   NO PAST SURGERIES     Family History:  Family History  Problem Relation Age of Onset   Healthy Mother    Diabetes Mother    Healthy Father    Colon cancer Neg Hx    Esophageal cancer Neg Hx    Stomach cancer Neg Hx    Pancreatic cancer Neg Hx    Family Psychiatric History:  None reported Social History: Born and raised in [place]. Married and divorced; has 4 grown children. Unemployed currently and living on social security. He states he lived at an apartment in  [place], but he may lose it because it wasn't leased under his name and he was vacant for a while. Per patient, he recently went to jail and needs to follow up  at a court twice a month (2nd, 4th Thursday of the month) with urine drug screen.    Current Medications: Current Facility-Administered Medications  Medication Dose Route Frequency Provider Last Rate Last Admin   acetaminophen (TYLENOL) tablet 650 mg  650 mg Oral Q6H PRN Eligha Bridegroom, NP       alum & mag hydroxide-simeth (MAALOX/MYLANTA) 200-200-20 MG/5ML suspension 30 mL  30 mL Oral Q4H PRN Eligha Bridegroom, NP       diphenhydrAMINE (BENADRYL) capsule 50 mg  50 mg Oral Q6H PRN Eligha Bridegroom, NP       Or   diphenhydrAMINE (BENADRYL) injection 50 mg  50 mg Intramuscular Q6H PRN Eligha Bridegroom, NP       haloperidol (HALDOL) tablet 10 mg  10 mg Oral BID Eligha Bridegroom, NP   10 mg at 09/19/23 0816   haloperidol (HALDOL) tablet 5 mg  5 mg Oral Q6H PRN Eligha Bridegroom, NP       Or   haloperidol lactate (HALDOL) injection 5 mg  5 mg Intramuscular Q6H PRN Eligha Bridegroom, NP       hydrOXYzine (ATARAX) tablet 25 mg  25 mg Oral TID PRN Eligha Bridegroom, NP   25 mg at 09/18/23 2036   lithium carbonate (ESKALITH) ER tablet 900 mg  900 mg Oral Q12H Eligha Bridegroom, NP   900 mg at 09/19/23 0815   LORazepam (ATIVAN) tablet 2 mg  2 mg Oral Q6H PRN Eligha Bridegroom, NP       Or   LORazepam (ATIVAN) injection 2 mg  2 mg Intramuscular Q6H PRN Eligha Bridegroom, NP       magnesium hydroxide (MILK OF MAGNESIA) suspension 15 mL  15 mL Oral Daily PRN Eligha Bridegroom, NP       traZODone (DESYREL) tablet 50 mg  50 mg Oral QHS PRN Eligha Bridegroom, NP   50 mg at 09/18/23 2035    Lab Results:  Results for orders placed or performed during the hospital encounter of 09/16/23 (from the past 48 hour(s))  Urinalysis, Routine w reflex microscopic -Urine, Clean Catch     Status: Abnormal   Collection Time: 09/17/23 12:44 PM  Result Value Ref Range   Color, Urine STRAW (A) YELLOW   APPearance CLEAR CLEAR   Specific Gravity, Urine 1.004 (L) 1.005 - 1.030   pH 6.0 5.0 - 8.0   Glucose, UA NEGATIVE NEGATIVE  mg/dL   Hgb urine dipstick NEGATIVE NEGATIVE   Bilirubin Urine NEGATIVE NEGATIVE   Ketones, ur NEGATIVE NEGATIVE mg/dL   Protein, ur NEGATIVE NEGATIVE mg/dL   Nitrite NEGATIVE NEGATIVE   Leukocytes,Ua NEGATIVE NEGATIVE    Comment: Performed at [facility], [address] [place], [place] [address]  Hemoglobin A1c     Status: None   Collection Time: 09/18/23  6:22 AM  Result Value Ref Range   Hgb A1c MFr Bld 5.3 4.8 - 5.6 %    Comment: (NOTE) Pre diabetes:          5.7%-6.4%  Diabetes:              >6.4%  Glycemic control for   <7.0% adults with diabetes    Mean Plasma Glucose 105.41 mg/dL    Comment: Performed at [facility], [address], [place], [place] [address]  Blood Alcohol level:  Lab Results  Component Value Date   ETH <10 09/04/2023   ETH <10 03/18/2020    Metabolic Disorder Labs: Lab Results  Component Value Date   HGBA1C 5.3 09/18/2023   MPG 105.41 09/18/2023   MPG 96.8 03/24/2020   No results found for: "PROLACTIN" No results found for: "CHOL", "TRIG", "HDL", "CHOLHDL", "VLDL", "LDLCALC"   Musculoskeletal: Strength & Muscle Tone: within normal limits Gait & Station: normal Patient leans: N/A  Psychiatric Specialty Exam:  General Appearance: appears at stated age, casually dressed and groomed  Behavior: pleasant and cooperative  Psychomotor Activity: no psychomotor agitation or retardation noted  Eye Contact: fair  Speech: normal amount, tone, volume and fluency  Mood: euthymic  Affect: congruent, pleasant and interactive  Thought Process: linear, goal directed, no circumstantial or tangential thought process noted, no racing thoughts or flight of ideas  Descriptions of Associations: intact  Thought Content: no bizarre content, logical and future-oriented  Hallucinations: denies AH, VH , does not appear responding to stimuli  Delusions: no paranoia, delusions of control, grandeur, ideas of reference,  thought broadcasting, and magical thinking  Suicidal Thoughts: denies SI, intention, plan  Homicidal Thoughts: denies HI, intention, plan  Alertness/Orientation: alert and fully oriented  Insight: limited, but with improvement  Judgment: limited, but with improvement  Memory: impaired for specific topics  Executive Functions  Concentration: intact  Attention Span: fair Recall: intact Fund of Knowledge: fair    Assets  Desire for Improvement    Physical Exam: Constitutional:      Appearance: Normal appearance.  Cardiovascular:     Rate and Rhythm: Normal rate.  Pulmonary:     Effort: Pulmonary effort is normal.  Neurological:     General: No focal deficit present.     Mental Status: Alert and oriented to person, place, and time.    Review of Systems  No reported symptoms.   ASSESSMENT:  Trevaughn Mullane is a 42 y/o M with hx of schizoaffective disorder bipolar type who presents to the [facility] involuntarily from [facility] Emergency Department for evaluation and management of psychosis.  Diagnoses / Active Problems: Principal Problem: Schizoaffective disorder, bipolar type (HCC) Diagnosis: Principal Problem:   Schizoaffective disorder, bipolar type (HCC)   PLAN: Safety and Monitoring: - Involuntary admission to inpatient psychiatric unit for safety, stabilization and treatment - Daily contact with patient to assess and evaluate symptoms and progress in treatment - Patient's case to be discussed in multi-disciplinary team meeting - Observation Level : q15 minute checks - Vital signs:  q12 hours - Precautions: suicide, elopement, and assault   2. Medications:  The risks/benefits/side-effects/alternatives to this medication were discussed in detail with the patient and time was given for questions. The patient consents to medication trial. FDA block box warnings, if presents, were discussed.   Psychiatric Diagnosis and treatment - Continue  lithium carbonate ER 900 mg BID (Lab on 10/8 normal at 0.85)  -Plan to obtain another lithium level tomorrow AM (10/19) - Continue Haldol 10 mg BID for psychosis   Medical Diagnosis and treatment DM: doing well without medications. Will follow up w/ PCP.  PRN Medications - Trazodone 50 mg at bedtime, Atarax 25 mg TID, Tylenol 650 mg q6H, Mylanta 30 ml q4H, Magnesium milk 30 ml daily - Agitation Protocol: Haldol, Ativan, Benadryl   3. Pertinent labs:  EKG: QTc 427  (09/16/2023)  CBC:  normal  (09/04/2023) CMP:  BG 115, otherwise unremarkable (09/16/2023) TSH:  normal  (05/03/2023)  A1c:  5.3% (09/18/2023) Lipid panel:  normal  (05/03/2023) UDS:  none detected  (09/04/2023) Ethanol:  <10  (05/02/2023) UA:  unremarkable  (09/17/2023)   4. Group and Therapy: - Encouraged patient to participate in unit milieu and in scheduled group therapies  - Short Term Goals: Ability to identify changes in lifestyle to reduce recurrence of condition will improve, Ability to verbalize feelings will improve, Ability to disclose and discuss suicidal ideas, Ability to demonstrate self-control will improve, Ability to identify and develop effective coping behaviors will improve, Ability to maintain clinical measurements within normal limits will improve, Compliance with prescribed medications will improve, and Ability to identify triggers associated with substance abuse/mental health issues will improve - Long Term Goals: Improvement in symptoms so as ready for discharge   5. Discharge Planning:  - Social work and case management to assist with discharge planning and identification of hospital follow-up needs prior to discharge - Estimated LOS: 5-7 days - Discharge Concerns: Need to establish a safety plan; Medication compliance and effectiveness - Discharge Goals: Return home with outpatient referrals for mental health follow-up including medication management/psychotherapy      Total Time Spent in Direct  Patient Care:  I personally spent 20 minutes on the unit in direct patient care. The direct patient care time included face-to-face time with the patient, reviewing the patient's chart, communicating with other professionals, and coordinating care. Greater than 50% of this time was spent in counseling or coordinating care with the patient regarding goals of hospitalization, psycho-education, and discharge planning needs.   Bo Mcclintock, Medical Student 09/19/2023, 10:01 AM   I personally was present and performed or re-performed the history and medical decision-making activities of this service and have verified that the service and findings are accurately documented in the student's note.  Kizzie Ide, MD, PGY-2

## 2023-09-19 NOTE — [facility] Group Notes (Signed)
Spirituality group facilitated by Kathleen Argue, BCC.  Group Description: Group focused on topic of hope. Patients participated in facilitated discussion around topic, connecting with one another around experiences and definitions for hope. Group members engaged with visual explorer photos, reflecting on what hope looks like for them today. Group engaged in discussion around how their definitions of hope are present today in hospital.  Modalities: Psycho-social ed, Adlerian, Narrative, MI  Patient Progress: Steven Richardson attended group and actively engaged and participated in group conversation and activities.

## 2023-09-19 NOTE — Group Note (Signed)
Recreation Therapy Group Note   Group Topic:Problem Solving  Group Date: 09/19/2023 Start Time: 1015 End Time: 1050 Facilitators: Ereka Brau-McCall, LRT,CTRS Location: [address] Dayroom  Goal Area(s) Addresses:  Patient will effectively work in a team with other group members. Patient will verbalize importance of using appropriate problem solving techniques.  Patient will identify positive change associated with effective problem solving skills.   Group Description: Brain Teasers. Patients were given two sheets of brain teasers. Patients worked together to Radiation protection practitioner.     Education Outcome: Acknowledges understanding/In group clarification offered/Needs additional education.    Affect/Mood: Flat   Participation Level: Minimal   Participation Quality: Independent   Behavior: Appropriate   Speech/Thought Process: Focused   Insight: Fair   Judgement: Fair    Modes of Intervention: Group work   Patient Response to Interventions:  Engaged   Education Outcome:  In group clarification offered    Clinical Observations/Individualized Feedback: Pt was engaged and receptive. Pt made attempts to answer the questions. Pt also had was able to get some the answers correct. Pt would smile throughout group session.      Plan: Continue to engage patient in RT group sessions 2-3x/week.   Syrus Nakama-McCall, LRT,CTRS 09/19/2023 12:43 PM

## 2023-09-19 NOTE — Group Note (Signed)
Date:  09/19/2023 Time:  8:47 PM  Group Topic/Focus:  Wrap-Up Group:   The focus of this group is to help patients review their daily goal of treatment and discuss progress on daily workbooks.    Participation Level:  Did Not Attend   Scot Dock 09/19/2023, 8:47 PM

## 2023-09-19 NOTE — Progress Notes (Signed)
   09/18/23 2035  Psych Admission Type (Psych Patients Only)  Admission Status Involuntary  Psychosocial Assessment  Patient Complaints Anxiety  Eye Contact Fair  Facial Expression Flat  Affect Anxious  Speech Logical/coherent  Interaction Assertive  Motor Activity Fidgety  Appearance/Hygiene Unremarkable  Behavior Characteristics Cooperative  Mood Anxious;Pleasant  Thought Process  Coherency WDL  Content Preoccupation  Delusions None reported or observed  Perception WDL  Hallucination None reported or observed  Judgment Impaired  Confusion None  Danger to Self  Current suicidal ideation? Denies  Agreement Not to Harm Self Yes  Description of Agreement verbal  Danger to Others  Danger to Others None reported or observed

## 2023-09-19 NOTE — [facility] Group Notes (Signed)
Adult Psychoeducational Group Note  Date:  09/19/2023 Time:  1:38 PM  Group Topic/Focus:  Goals Group:   The focus of this group is to help patients establish daily goals to achieve during treatment and discuss how the patient can incorporate goal setting into their daily lives to aide in recovery.  Participation Level:  Active  Participation Quality:  Appropriate  Affect:  Appropriate  Cognitive:  Appropriate  Insight: Appropriate  Engagement in Group:  Engaged  Modes of Intervention:  Discussion  Additional Comments:  Pt attended the goals group and remained appropriate and engaged throughout the duration of the group.   Sheran Lawless 09/19/2023, 1:38 PM

## 2023-09-19 NOTE — Care Management Important Message (Signed)
Medicare IM printed and given to Joy Buford, LCSW to give to the patient.

## 2023-09-19 NOTE — Progress Notes (Signed)
   09/19/23 1000  Psych Admission Type (Psych Patients Only)  Admission Status Involuntary  Psychosocial Assessment  Patient Complaints Anxiety  Eye Contact Fair  Facial Expression Animated  Affect Anxious  Speech Logical/coherent  Interaction Assertive  Motor Activity Fidgety  Appearance/Hygiene Unremarkable  Behavior Characteristics Cooperative  Mood Anxious;Pleasant  Thought Process  Coherency WDL  Content Preoccupation  Delusions Paranoid  Perception Hallucinations  Hallucination Auditory  Judgment Impaired  Confusion None  Danger to Self  Current suicidal ideation? Denies  Danger to Others  Danger to Others None reported or observed   DAR NOTE: Patient presents with anxious affect and mood.  Denies suicidal thoughts, pain, auditory and visual hallucinations.  Rates depression at 0, hopelessness at 0, and anxiety at 2.  Maintained on routine safety checks.  Medications given as prescribed.  Support and encouragement offered as needed.  Attended group and participated.  States goal for today is "keep up my progress."  Patient observed socializing with peers in the dayroom.  Offered no complaint.  Patient is safe on and off the unit.

## 2023-09-19 NOTE — Progress Notes (Signed)
   09/19/23 2233  Psych Admission Type (Psych Patients Only)  Admission Status Involuntary  Psychosocial Assessment  Patient Complaints Anxiety  Eye Contact Fair  Facial Expression Animated  Affect Anxious  Speech Logical/coherent  Interaction Assertive  Motor Activity Fidgety  Appearance/Hygiene Unremarkable  Behavior Characteristics Cooperative  Mood Anxious  Thought Process  Coherency WDL  Content Preoccupation  Delusions None reported or observed  Perception WDL  Hallucination None reported or observed  Judgment Impaired  Confusion None  Danger to Self  Current suicidal ideation? Denies  Description of Agreement verbal  Danger to Others  Danger to Others None reported or observed

## 2023-09-20 DIAGNOSIS — F250 Schizoaffective disorder, bipolar type: Secondary | ICD-10-CM | POA: Diagnosis not present

## 2023-09-20 LAB — LITHIUM LEVEL: Lithium Lvl: 0.93 mmol/L (ref 0.60–1.20)

## 2023-09-20 MED ORDER — HALOPERIDOL DECANOATE 100 MG/ML IM SOLN
100.0000 mg | Freq: Once | INTRAMUSCULAR | Status: AC
  Administered 2023-09-20: 100 mg via INTRAMUSCULAR
  Filled 2023-09-20 (×2): qty 1

## 2023-09-20 MED ORDER — HALOPERIDOL 5 MG PO TABS
10.0000 mg | ORAL_TABLET | Freq: Two times a day (BID) | ORAL | Status: DC
  Administered 2023-09-20 – 2023-09-21 (×2): 10 mg via ORAL
  Filled 2023-09-20 (×4): qty 2

## 2023-09-20 NOTE — Progress Notes (Signed)
   09/20/23 2020  Psych Admission Type (Psych Patients Only)  Admission Status Involuntary  Psychosocial Assessment  Patient Complaints Anxiety  Eye Contact Fair  Facial Expression Flat  Affect Appropriate to circumstance  Speech Logical/coherent  Interaction Assertive  Motor Activity Fidgety  Appearance/Hygiene Unremarkable  Behavior Characteristics Cooperative  Mood Pleasant  Thought Process  Coherency WDL  Content Preoccupation  Delusions None reported or observed  Perception WDL  Hallucination None reported or observed  Judgment Poor  Confusion None  Danger to Self  Current suicidal ideation? Denies  Danger to Others  Danger to Others None reported or observed

## 2023-09-20 NOTE — Plan of Care (Signed)
  Problem: Education: Goal: Knowledge of [facility] Education information/materials will improve Outcome: Progressing Goal: Emotional status will improve Outcome: Progressing Goal: Mental status will improve Outcome: Progressing Goal: Verbalization of understanding the information provided will improve Outcome: Progressing   Problem: Activity: Goal: Interest or engagement in activities will improve Outcome: Progressing Goal: Sleeping patterns will improve Outcome: Progressing   Problem: Safety: Goal: Periods of time without injury will increase Outcome: Progressing

## 2023-09-20 NOTE — Progress Notes (Signed)
Adult Psychoeducational Group Note  Date:  09/20/2023 Time:  10:28 AM  Group Topic/Focus:  Goals Group:   The focus of this group is to help patients establish daily goals to achieve during treatment and discuss how the patient can incorporate goal setting into their daily lives to aide in recovery.  Participation Level:  Active  Participation Quality:  Appropriate  Affect:  Appropriate  Cognitive:  Appropriate  Insight: Appropriate  Engagement in Group:  Engaged  Modes of Intervention:  Discussion  Additional Comments: The patient  attended group  Steven Richardson 09/20/2023, 10:28 AM

## 2023-09-20 NOTE — Progress Notes (Signed)
09/20/2023         Time: 1500      Group Topic/Focus: Rec Therapy  Participation Level: Active  Participation Quality: Appropriate  Affect: Appropriate  Cognitive: Appropriate   Additional Comments: Patient attended    Steven Richardson 09/20/2023 3:01 PM

## 2023-09-20 NOTE — Progress Notes (Cosign Needed Addendum)
[facility] MD Progress Note  09/20/2023 6:45 AM Steven Richardson  MRN:  [id number]   Reason for Admission:  Steven Richardson is a 42 y.o. male with a history of schizoaffective disorder bipolar type, who was initially admitted for inpatient psychiatric hospitalization on 09/16/2023 for management of psychosis. The patient is currently on Hospital Day 4.   Chart Review from last 24 hours:  The patient's chart was reviewed and nursing notes were reviewed. Vital signs: stable. The patient's case was discussed in multidisciplinary team meeting. Per [facility], patient was taking medications appropriately. Patient received the following PRN medications: trazodone and atarax. Per nursing, patient was anxious and cooperative and attended 3 groups.  Information Obtained Today During Patient Interview: The patient was seen in his room, no acute distress. On assessment, the patient feels "good" today. Patient feels the group session have been helpful and he enjoys discussing what his goals are daily. When asked about speaking with family or friends, he states his uncle visited and his mother plans on visiting. He states when he speaks with his mother, she states that the patient sounds better. Patient reports having good sleep and good appetite. Patient feels that the medications have been helpful in keeping him calm and denies adverse effects such as dystonia, akathesia, and n/v. When asked about manic symptoms and paranoia, patient reports it has been improving.  Denies SI, HI, AVH.   Information Obtained Today from Collateral: CSW able to contact patient's mother and she plans to visit patient and agrees to live with patient when he is discharged from the hospital. Steven Richardson (mother, primary POC): [phone] Interpreter service (arabic): [phone]   Principal Problem: Schizoaffective disorder, bipolar type (HCC) Diagnosis: Principal Problem:   Schizoaffective disorder, bipolar type (HCC)   Past Psychiatric History:   Previous Psych Diagnoses: schizoaffective disorder bipolar type Prior psychiatric treatment: per chart review, Risperdal, Seroquel, Zyprexa Psychiatric medication compliance history: unclear   Current psychiatric treatment: Haldol, lithium Current psychiatrist: Dr. Catha Gosselin in [place] Current therapist: Denies   Previous hospitalizations per chart review, 5 previous hospitalization, most recently in June of this year for SI and psychosis History of suicide attempts: Once in 2005 where he jumped off a store History of self harm: Denies   Substance Use History: Alcohol: Denies   --------   Tobacco: stopped 4 months ago and previously smoking 2 packs daily for 1.5 years and prior to that, smokes 1 pack daily Marijuana: Denies Cocaine: Denies Methamphetamines: Denies Ecstasy: Denies Opiates: Denies Benzodiazepines: Denies Prescribed Meds abuse: Denies   History of Detox / Rehab: Denies  Past Medical History:  Past Medical History:  Diagnosis Date   Constipation    Diabetes mellitus without complication (HCC)    Paranoid schizophrenia (HCC)     Past Surgical History:  Procedure Laterality Date   NO PAST SURGERIES     Family History:  Family History  Problem Relation Age of Onset   Healthy Mother    Diabetes Mother    Healthy Father    Colon cancer Neg Hx    Esophageal cancer Neg Hx    Stomach cancer Neg Hx    Pancreatic cancer Neg Hx    Family Psychiatric History:  None reported Social History: Born and raised in [place]. Married and divorced; has 4 grown children. Unemployed currently and living on social security. He states he lived at an apartment in [place], but he may lose it because it wasn't leased under his name and he was vacant for a while. Per  patient, he recently went to jail and needs to follow up at a court twice a month (2nd, 4th Thursday of the month) with urine drug screen.    Current Medications: Current Facility-Administered Medications   Medication Dose Route Frequency Provider Last Rate Last Admin   acetaminophen (TYLENOL) tablet 650 mg  650 mg Oral Q6H PRN Eligha Bridegroom, NP       alum & mag hydroxide-simeth (MAALOX/MYLANTA) 200-200-20 MG/5ML suspension 30 mL  30 mL Oral Q4H PRN Eligha Bridegroom, NP       diphenhydrAMINE (BENADRYL) capsule 50 mg  50 mg Oral Q6H PRN Eligha Bridegroom, NP       Or   diphenhydrAMINE (BENADRYL) injection 50 mg  50 mg Intramuscular Q6H PRN Eligha Bridegroom, NP       haloperidol (HALDOL) tablet 10 mg  10 mg Oral BID Eligha Bridegroom, NP   10 mg at 09/19/23 1711   haloperidol (HALDOL) tablet 5 mg  5 mg Oral Q6H PRN Eligha Bridegroom, NP       Or   haloperidol lactate (HALDOL) injection 5 mg  5 mg Intramuscular Q6H PRN Eligha Bridegroom, NP       hydrOXYzine (ATARAX) tablet 25 mg  25 mg Oral TID PRN Eligha Bridegroom, NP   25 mg at 09/19/23 2035   lithium carbonate (ESKALITH) ER tablet 900 mg  900 mg Oral Q12H Eligha Bridegroom, NP   900 mg at 09/19/23 2035   LORazepam (ATIVAN) tablet 2 mg  2 mg Oral Q6H PRN Eligha Bridegroom, NP       Or   LORazepam (ATIVAN) injection 2 mg  2 mg Intramuscular Q6H PRN Eligha Bridegroom, NP       magnesium hydroxide (MILK OF MAGNESIA) suspension 15 mL  15 mL Oral Daily PRN Eligha Bridegroom, NP       traZODone (DESYREL) tablet 50 mg  50 mg Oral QHS PRN Eligha Bridegroom, NP   50 mg at 09/19/23 2034    Lab Results:  No results found for this or any previous visit (from the past 48 hour(s)).   Blood Alcohol level:  Lab Results  Component Value Date   ETH <10 09/04/2023   ETH <10 03/18/2020    Metabolic Disorder Labs: Lab Results  Component Value Date   HGBA1C 5.3 09/18/2023   MPG 105.41 09/18/2023   MPG 96.8 03/24/2020   No results found for: "PROLACTIN" No results found for: "CHOL", "TRIG", "HDL", "CHOLHDL", "VLDL", "LDLCALC"   Musculoskeletal: Strength & Muscle Tone: within normal limits Gait & Station: normal Patient leans: N/A  Psychiatric  Specialty Exam:  General Appearance: appears at stated age, casually dressed and groomed   Behavior: pleasant and cooperative   Psychomotor Activity: no psychomotor agitation or retardation noted   Eye Contact: fair  Speech: normal amount, tone, volume and fluency    Mood: euthymic  Affect: congruent, pleasant and interactive   Thought Process: linear, goal directed, no circumstantial or tangential thought process noted, no racing thoughts or flight of ideas  Descriptions of Associations: intact   Thought Content Hallucinations: denies AH, VH , does not appear responding to stimuli  Delusions: no paranoia, delusions of control, grandeur, ideas of reference, thought broadcasting, and magical thinking  Suicidal Thoughts: denies SI, intention, plan  Homicidal Thoughts: denies HI, intention, plan   Alertness/Orientation: alert and fully oriented   Insight: fair Judgment: fair  Memory: intact   Executive Functions  Concentration: intact  Attention Span: fair  Recall: intact  Fund of Knowledge:  fair    Executive Functions  Concentration: intact  Attention Span: fair Recall: intact Fund of Knowledge: fair    Assets  Desire for Improvement    Physical Exam  General: Pleasant, well-appearing. No acute distress. Pulmonary: Normal effort. No wheezing or rales. Skin: No obvious rash or lesions. Neuro: A&Ox3.No focal deficit.  AIMS: No dystonia, akathisia, tremor, rigidity, shuffling gait  Review of Systems  No reported symptoms   ASSESSMENT:  Steven Richardson is a 42 y/o M with hx of schizoaffective disorder bipolar type who presents to the [facility] involuntarily from [facility] for evaluation and management of psychosis.  Patient more linear and not disorganized today and no hypersexual behaviors. Will see how visit with mother goes today. Lithium level returned WNL.   Diagnoses / Active Problems: Principal Problem:  Schizoaffective disorder, bipolar type (HCC) Diagnosis: Principal Problem:   Schizoaffective disorder, bipolar type (HCC)   PLAN: Safety and Monitoring: - Involuntary admission to inpatient psychiatric unit for safety, stabilization and treatment - Daily contact with patient to assess and evaluate symptoms and progress in treatment - Patient's case to be discussed in multi-disciplinary team meeting - Observation Level : q15 minute checks - Vital signs:  q12 hours - Precautions: suicide, elopement, and assault   2. Medications:  The risks/benefits/side-effects/alternatives to this medication were discussed in detail with the patient and time was given for questions. The patient consents to medication trial. FDA block box warnings, if presents, were discussed.   Psychiatric Diagnosis and treatment - Continue lithium carbonate ER 900 mg BID (Lab on 10/8 normal at 0.85)  -Lithium level on 10/19 is 0.93 - Give Haldol Decanoate 100 mg IM today-10/19 - Continue Haldol 10 mg BID for psychosis for 14 more days   Medical Diagnosis and treatment DM: doing well without medications. Will follow up w/ PCP.  PRN Medications - Trazodone 50 mg at bedtime, Atarax 25 mg TID, Tylenol 650 mg q6H, Mylanta 30 ml q4H, Magnesium milk 30 ml daily - Agitation Protocol: Haldol, Ativan, Benadryl   3. Pertinent labs:  EKG: QTc 427  (09/16/2023)  CBC:  normal  (09/04/2023) CMP:  BG 115, otherwise unremarkable (09/16/2023) TSH:  normal  (05/03/2023) A1c:  5.3% (09/18/2023) Lipid panel:  normal  (05/03/2023) UDS:  none detected  (09/04/2023) Ethanol:  <10  (05/02/2023) UA:  unremarkable  (09/17/2023)   4. Group and Therapy: - Encouraged patient to participate in unit milieu and in scheduled group therapies  - Short Term Goals: Ability to identify changes in lifestyle to reduce recurrence of condition will improve, Ability to verbalize feelings will improve, Ability to disclose and discuss suicidal ideas,  Ability to demonstrate self-control will improve, Ability to identify and develop effective coping behaviors will improve, Ability to maintain clinical measurements within normal limits will improve, Compliance with prescribed medications will improve, and Ability to identify triggers associated with substance abuse/mental health issues will improve - Long Term Goals: Improvement in symptoms so as ready for discharge   5. Discharge Planning:  - Social work and case management to assist with discharge planning and identification of hospital follow-up needs prior to discharge - Estimated LOS: 5-7 days - Discharge Concerns: Need to establish a safety plan; Medication compliance and effectiveness - Discharge Goals: Return home with outpatient referrals for mental health follow-up including medication management/psychotherapy      Total Time Spent in Direct Patient Care:  I personally spent 20 minutes on the unit in direct patient care. The direct  patient care time included face-to-face time with the patient, reviewing the patient's chart, communicating with other professionals, and coordinating care. Greater than 50% of this time was spent in counseling or coordinating care with the patient regarding goals of hospitalization, psycho-education, and discharge planning needs.   Lance Muss, MD 09/20/2023, 6:45 AM

## 2023-09-20 NOTE — Progress Notes (Signed)
Patient rated his depression  level 0/10 and his anxiety level 0/10 with 10 being the highest and 0 none. Patient  identified his goal for today as," Getting discharged". Medication and group compliant.  Pt tolerated Haldol Dec IM well. Minimal interaction observed with some peers. Pt denied SI/HI and AVH. Appetite good on shift. Safety maintained.  09/20/23 0800  Psych Admission Type (Psych Patients Only)  Admission Status Involuntary  Psychosocial Assessment  Patient Complaints Anxiety  Eye Contact Fair  Facial Expression Animated  Affect Anxious  Speech Logical/coherent  Interaction Assertive  Motor Activity Fidgety  Appearance/Hygiene Unremarkable  Behavior Characteristics Cooperative  Mood Anxious;Pleasant  Thought Process  Coherency WDL  Content Preoccupation  Delusions None reported or observed  Perception WDL  Hallucination None reported or observed  Judgment Impaired  Confusion None  Danger to Self  Current suicidal ideation? Denies  Agreement Not to Harm Self Yes  Description of Agreement Verbal  Danger to Others  Danger to Others None reported or observed

## 2023-09-20 NOTE — Plan of Care (Signed)

## 2023-09-20 NOTE — Group Note (Signed)
Date:  09/20/2023 Time:  9:28 PM  Group Topic/Focus:  Wrap-Up Group:   The focus of this group is to help patients review their daily goal of treatment and discuss progress on daily workbooks.    Participation Level:  Active  Participation Quality:  Appropriate  Affect:  Appropriate  Cognitive:  Appropriate  Insight: Appropriate  Engagement in Group:  Developing/Improving  Modes of Intervention:  Discussion  Additional Comments:  Pt stated his goal for today was to focus on his treatment plan and discuss his discharge plan with his treatment team. Pt stated he accomplished his goals today. Pt stated he talked with his doctor and social worker about his care today. Pt rated his overall day a 8 out of 10. Pt stated his mother coming for visitation tonight improved his overall day. Pt stated he felt better about himself today. Pt stated he was able to attend all meals. Pt stated he took all medications provided today. Pt stated he attend all groups held today. Pt stated his appetite was pretty good today. Pt rated sleep last night was pretty good. Pt stated the goal tonight was to get some rest. Pt stated he had no physical pain tonight. Pt deny visual hallucinations and auditory issues tonight. Pt denies thoughts of harming himself or others. Pt stated he would alert staff if anything changed  Felipa Furnace 09/20/2023, 9:28 PM

## 2023-09-21 DIAGNOSIS — F250 Schizoaffective disorder, bipolar type: Secondary | ICD-10-CM

## 2023-09-21 MED ORDER — HYDROXYZINE HCL 25 MG PO TABS: 25.0000 mg | ORAL_TABLET | Freq: Three times a day (TID) | ORAL | 0 refills | Status: AC | PRN

## 2023-09-21 MED ORDER — HALOPERIDOL 5 MG PO TABS
5.0000 mg | ORAL_TABLET | Freq: Two times a day (BID) | ORAL | Status: DC
  Filled 2023-09-21: qty 1

## 2023-09-21 MED ORDER — HALOPERIDOL DECANOATE 100 MG/ML IM SOLN: 100.0000 mg | INTRAMUSCULAR | 0 refills | Status: AC

## 2023-09-21 MED ORDER — HALOPERIDOL 5 MG PO TABS: 5.0000 mg | ORAL_TABLET | Freq: Two times a day (BID) | ORAL | 0 refills | Status: AC

## 2023-09-21 MED ORDER — LITHIUM CARBONATE ER 450 MG PO TBCR: 900.0000 mg | EXTENDED_RELEASE_TABLET | Freq: Two times a day (BID) | ORAL | 0 refills | Status: AC

## 2023-09-21 MED ORDER — TRAZODONE HCL 50 MG PO TABS: 50.0000 mg | ORAL_TABLET | Freq: Every evening | ORAL | 0 refills | Status: AC | PRN

## 2023-09-21 MED ORDER — HALOPERIDOL DECANOATE 100 MG/ML IM SOLN: 100.0000 mg | INTRAMUSCULAR | Status: DC

## 2023-09-21 NOTE — Progress Notes (Signed)
Discharge Note:  Patient denies SI/HI/AVH at this time. Discharge instructions, AVS, prescriptions, and transition record gone over with patient. Patient agrees to comply with medication management, follow-up visit, and outpatient therapy. Patient belongings returned to patient. Patient questions and concerns addressed and answered. Patient ambulatory off unit. Patient discharged to home via taxi.

## 2023-09-21 NOTE — Progress Notes (Signed)
   09/21/23 0800  Psych Admission Type (Psych Patients Only)  Admission Status Involuntary  Psychosocial Assessment  Patient Complaints None  Eye Contact Fair  Facial Expression Flat  Affect Appropriate to circumstance  Speech Logical/coherent  Interaction Assertive  Motor Activity Restless  Appearance/Hygiene Unremarkable  Behavior Characteristics Cooperative  Mood Pleasant  Thought Process  Coherency WDL  Content WDL  Delusions None reported or observed  Perception WDL  Hallucination None reported or observed  Judgment Limited  Confusion None  Danger to Self  Current suicidal ideation? Denies  Agreement Not to Harm Self Yes  Description of Agreement verbal  Danger to Others  Danger to Others None reported or observed

## 2023-09-21 NOTE — Plan of Care (Signed)
  Problem: Education: Goal: Emotional status will improve Outcome: Progressing Goal: Mental status will improve Outcome: Progressing   Problem: Activity: Goal: Sleeping patterns will improve Outcome: Progressing   

## 2023-09-21 NOTE — Progress Notes (Signed)
   09/21/23 0911  AMBULATORY ONLY: Non-Emergency Transportation Screening  1.  Is the Patient in the clinic and in need of immediate transportation? Yes  SDOH Interventions  Transportation Interventions Taxi Voucher Given    Per progression meeting, doctor states patient needs taxi voucher home to [facility].  Ambrose Mantle, LCSW 09/21/2023, 9:11 AM

## 2023-09-21 NOTE — Discharge Summary (Addendum)
Physician Discharge Summary Note Patient:  Steven Richardson is an 42 y.o., male MRN:  [id number] DOB:  1981/08/01 Patient phone:  [phone] (home)  Patient address:   [address] [place] [address],  Total Time spent with patient: 20 minutes  Date of Admission:  09/16/2023 Date of Discharge: 09/21/2023  Reason for Admission:  psychosis  Principal Problem: Schizoaffective disorder, bipolar type [facility]) Discharge Diagnoses: Principal Problem:   Schizoaffective disorder, bipolar type (HCC)   Past Psychiatric History: Previous Psych Diagnoses: schizoaffective disorder bipolar type Prior psychiatric treatment: per chart review, Risperdal, Seroquel, Zyprexa Psychiatric medication compliance history: unclear   Current psychiatric treatment: Haldol, lithium Current psychiatrist: Dr. Catha Gosselin in [place] Current therapist: Denies   Previous hospitalizations per chart review, 5 previous hospitalization, most recently in June of this year for SI and psychosis History of suicide attempts: Once in 2005 where he jumped off a store History of self harm: Denies   Substance Use History: Alcohol: Denies   --------   Tobacco: stopped 4 months ago and previously smoking 2 packs daily for 1.5 years and prior to that, smokes 1 pack daily Marijuana: Denies Cocaine: Denies Methamphetamines: Denies Ecstasy: Denies Opiates: Denies Benzodiazepines: Denies Prescribed Meds abuse: Denies   History of Detox / Rehab: Denies  Past Medical History:  Past Medical History:  Diagnosis Date   Constipation    Diabetes mellitus without complication (HCC)    Paranoid schizophrenia (HCC)     Past Surgical History:  Procedure Laterality Date   NO PAST SURGERIES      Family History:  Medical: Denies Psych: Denies Psych Rx: Denies Suicide: Denies Substance use family hx: Denies   Social History:  Place of birth and grew up where: [place] Marital Status: Single Children: 4 grown  children 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  Psychiatric diagnoses provided upon initial assessment: Principal Problem:   Schizoaffective disorder, bipolar type (HCC)   The following medications were managed:  Upon admission, the following medications were changed / started / discontinued: -Start lithium ER 900 mg q12h for mood stabilization             -Level on 10/8 is 0.85 in the ED; on 10/19 was 0.93 -Start Haldol 10 mg BID for psychosis  During the patient's stay, the following medications were changed / started / discontinued, with final adjustments by discharge: - Gave haldol decanoate 100 mg on 10/19 - Continue Haldol 5 mg BID oral for 14 days Next Haldol decanoate LAI 100 mg is set for 4-7 days after 09/20/2023 Afterwards, Haldol dec LAI dose thereafter will be 200 mg every month  During the hospitalization, patient had the following lab / imaging / testing abnormalities which require further evaluation / management / treatment: none  Patient's care was discussed during the interdisciplinary team meeting every day during the hospitalization.  The patient denies any side effects to prescribed psychiatric medication.  Steven Richardson is a 42 y.o. male with a history of schizoaffective disorder bipolar type, who was initially admitted for inpatient psychiatric hospitalization on 09/16/2023 for management of psychosis.   Gradually, patient started adjusting to milieu. The patient was evaluated each day by a clinical provider to ascertain response to treatment. Improvement was noted by the patient's  report of decreasing symptoms, improved sleep and appetite, affect,  medication tolerance, behavior, and participation in unit programming.  Patient was asked each day to complete a self inventory noting mood, mental status, pain, new symptoms, anxiety and concerns.    Symptoms were reported as significantly decreased or resolved completely by discharge.   On day of discharge, the patient reports that their mood is stable. The patient denied having suicidal thoughts for more than 48 hours prior to discharge.  Patient denies having homicidal thoughts.  Patient denies having auditory hallucinations.  Patient denies any visual hallucinations or other symptoms of psychosis. The patient was motivated to continue taking medication with a goal of continued improvement in mental health.   The patient reports their target psychiatric symptoms of paranoia and mania responded well to the psychiatric medications, and the patient reports overall benefit other psychiatric hospitalization. Supportive psychotherapy was provided to the patient. The patient also participated in regular group therapy while hospitalized. Coping skills, problem solving as well as relaxation therapies were also part of the unit programming.  Labs were reviewed with the patient, and abnormal results were discussed with the patient.  The patient is able to verbalize their individual safety plan to this provider.  Behavioral Events: none  Restraints: none  # It is recommended to the patient to continue psychiatric medications as prescribed, after discharge from the hospital.    # It is recommended to the patient to follow up with your outpatient psychiatric provider and PCP.  # It was discussed with the patient, the impact of alcohol, drugs, tobacco have been there overall psychiatric and medical wellbeing, and total abstinence from substance use was recommended to the patient.  # Prescriptions provided or sent directly to preferred pharmacy at discharge. Patient agreeable to plan. Given opportunity to  ask questions. Appears to feel comfortable with discharge.    # In the event of worsening symptoms, the patient is instructed to call the crisis hotline, 911 and or go to the nearest ED for appropriate evaluation and treatment of symptoms. To follow-up with primary care provider for other medical issues, concerns and or health care needs  # Patient was discharged home with a plan to follow up as noted below.   Mental Status Exam: General Appearance: appears at stated age, in scrubs and groomed   Behavior: pleasant and cooperative   Psychomotor Activity: no psychomotor agitation or retardation noted   Eye Contact: fair  Speech: normal amount, tone, volume and fluency    Mood: euthymic  Affect: congruent, pleasant and interactive   Thought Process: linear, goal directed, no circumstantial or tangential thought process noted, no racing thoughts or flight of ideas  Descriptions of Associations: intact  Thought Content: no bizarre content, logical and future-oriented  Hallucinations: denies AH, VH , does not appear responding to stimuli  Delusions: no paranoia, delusions of control, grandeur, ideas of reference, thought broadcasting, and magical thinking  Suicidal Thoughts: denies SI, intention, plan  Homicidal Thoughts: denies HI, intention, plan   Alertness/Orientation: alert and fully oriented   Insight: fair, improved  Judgment: fair, improved   Memory: intact   Executive Functions  Concentration: intact  Attention Span: fair  Recall: intact  Fund of Knowledge: fair    Physical Exam  General: Pleasant, well-appearing. No acute distress. Pulmonary: Normal effort. No wheezing or rales. Skin: No obvious rash or lesions. Neuro: A&Ox3.No focal deficit.   Review of Systems  No reported symptoms  Blood pressure (!) 122/92, pulse 72, temperature  98.1 F (36.7 C), temperature source Oral, resp. rate 16, SpO2 100%. There is no height or weight on file to calculate  BMI.  Assets  Assets:Desire for Improvement   Social History   Tobacco Use  Smoking Status Every Day   Types: Cigarettes  Smokeless Tobacco Never   Tobacco Cessation:  N/A, patient does not currently use tobacco products  Blood Alcohol level:  Lab Results  Component Value Date   ETH <10 09/04/2023   ETH <10 03/18/2020    Metabolic Disorder Labs:  Lab Results  Component Value Date   HGBA1C 5.3 09/18/2023   MPG 105.41 09/18/2023   MPG 96.8 03/24/2020   No results found for: "PROLACTIN" No results found for: "CHOL", "TRIG", "HDL", "CHOLHDL", "VLDL", "LDLCALC"   Is patient on multiple antipsychotic therapies at discharge:  No   Has Patient had three or more failed trials of antipsychotic monotherapy by history:  No  Recommended Plan for Multiple Antipsychotic Therapies: NA  Discharge Instructions     Diet - low sodium heart healthy   Complete by: As directed    Increase activity slowly   Complete by: As directed       Allergies as of 09/21/2023   No Known Allergies      Medication List     STOP taking these medications    benztropine 1 MG tablet Commonly known as: COGENTIN   divalproex 500 MG 24 hr tablet Commonly known as: DEPAKOTE ER   doxycycline 100 MG capsule Commonly known as: VIBRAMYCIN   fluconazole 200 MG tablet Commonly known as: DIFLUCAN   hydrochlorothiazide 25 MG tablet Commonly known as: HYDRODIURIL   OLANZapine 15 MG tablet Commonly known as: ZYPREXA   propranolol 20 MG tablet Commonly known as: INDERAL   QUEtiapine 400 MG tablet Commonly known as: SEROQUEL   QUEtiapine 50 MG tablet Commonly known as: SEROQUEL   risperiDONE 2 MG tablet Commonly known as: RISPERDAL       TAKE these medications      Indication  haloperidol 5 MG tablet Commonly known as: HALDOL Take 1 tablet (5 mg total) by mouth 2 (two) times daily for 14 days. Start taking on: September 22, 2023  Indication: Psychosis   haloperidol decanoate  100 MG/ML injection Commonly known as: HALDOL DECANOATE Inject 1 mL (100 mg total) into the muscle every 30 (thirty) days for 1 dose. Next dose administration is due 09-26-2023. Start taking on: September 26, 2023  Indication: Schizophrenia   hydrOXYzine 25 MG tablet Commonly known as: ATARAX Take 1 tablet (25 mg total) by mouth 3 (three) times daily as needed for anxiety.  Indication: Feeling Anxious   lithium carbonate 450 MG ER tablet Commonly known as: ESKALITH Take 2 tablets (900 mg total) by mouth every 12 (twelve) hours. What changed:  medication strength how much to take how to take this when to take this additional instructions  Indication: Manic-Depression   traZODone 50 MG tablet Commonly known as: DESYREL Take 1 tablet (50 mg total) by mouth at bedtime as needed for sleep.  Indication: Trouble Sleeping        Follow-up Information     [facility] Follow up on 09/25/2023.   Why: You have a hospital follow up appointment for therapy and medication management services on 09/25/23 at 12:00 pm.  This will be a Virtual telehealth appt. Contact information: [address]  [address] [place] [address] [phone]  Discharge recommendations:   Next Haldol decanoate LAI 100 mg is set for 4-7 days after 09/20/2023 Afterwards, Haldol dec LAI dose thereafter will be 200 mg every month  Activity: as tolerated  Diet: heart healthy  # It is recommended to the patient to continue psychiatric medications as prescribed, after discharge from the hospital.     # It is recommended to the patient to follow up with your outpatient psychiatric provider -instructions on appointment date, time, and address (location) are provided to you in discharge paperwork  # Follow-up with outpatient primary care doctor and other specialists -for management of chronic medical disease, including: none  # Testing: Follow-up with outpatient provider for abnormal lab  results: none   # It was discussed with the patient, the impact of alcohol, drugs, tobacco have been there overall psychiatric and medical wellbeing, and total abstinence from substance use was recommended to the patient.   # Prescriptions provided or sent directly to preferred pharmacy at discharge. Patient agreeable to plan. Given opportunity to ask questions. Appears to feel comfortable with discharge.    # In the event of worsening symptoms, the patient is instructed to call the crisis hotline, 911, and or go to the nearest ED for appropriate evaluation and treatment of symptoms. To follow-up with primary care provider for other medical issues, concerns and or health care needs  Patient agrees with D/C instructions and plan.   I discussed my assessment, planned testing and intervention for the patient with Dr. Sherron Flemings who agrees with my formulated course of action.   Signed: Lance Muss, MD, PGY-2 09/21/2023, 9:35 AM

## 2023-09-21 NOTE — Discharge Instructions (Addendum)
-  Follow-up with your outpatient psychiatric provider -instructions on appointment date, time, and address (location) are provided to you in discharge paperwork.  -Take your psychiatric medications as prescribed at discharge - instructions are provided to you in the discharge paperwork You received haldol dec 100 mg LAI on 09-20-2023. Your next dose of haldol dec 100 mg LAI is due in 4-7 days after 09-20-2023. Your hadlold dec LAI dose thereafter will be 200 mg every month.  You will continue at haldol 5 mg bid for 14 days, then your outpatient psychiatrist can adjust/taper your oral haldol dose further.   Your lithium level on 09-20-2023 was: 0.93    -Follow-up with outpatient primary care doctor and other specialists -for management of preventative medicine and any chronic medical disease.  -Recommend abstinence from alcohol, tobacco, and other illicit drug use at discharge.   -If your psychiatric symptoms recur, worsen, or if you have side effects to your psychiatric medications, call your outpatient psychiatric provider, 911, [phone] or go to the nearest emergency department.  -If suicidal thoughts occur, call your outpatient psychiatric provider, 911, [phone] or go to the nearest emergency department.  Naloxone (Narcan) can help reverse an overdose when given to the victim quickly.  [facility] offers free naloxone kits and instructions/training on its use.  Add naloxone to your first aid kit and you can help save a life.   Pick up your free kit at the following locations:   [place]:  [facility], [address] [place] [address] [phone]) [facility] [address] [place] [address] [phone]) [facility] [address] [place] [address]  [place]: [facility] of [facility] [address] [place] [address] ([phone]) [facility] [address] [place] [address] [phone])

## 2023-09-21 NOTE — Progress Notes (Signed)
  [facility] Adult Case Management Discharge Plan :  Will you be returning to the same living situation after discharge:  Yes,  lives with mother At discharge, do you have transportation home?: No. Will need a taxi voucher  Do you have the ability to pay for your medications: Yes,  has Medicare  Release of information consent forms completed and emailed to Medical Records, then turned in to Medical Records by CSW.   Patient to Follow up at:  Follow-up Information     [facility] Follow up on 09/25/2023.   Why: You have a hospital follow up appointment for therapy and medication management services on 09/25/23 at 12:00 pm.  This will be a Virtual telehealth appt. Contact information: [address]  [address] [place] [address] [phone]                 Next level of care provider has access to [facility] Link:no  Safety Planning and Suicide Prevention discussed: Yes,  with Diamond Olivares (Mother) @ [phone]     Has patient been referred to the Quitline?: Patient refused referral for treatment  Patient has been referred for addiction treatment: No known substance use disorder.  Lynnell Chad, LCSW 09/21/2023, 8:34 AM

## 2023-09-21 NOTE — [facility] Suicide Risk Assessment (Signed)
Suicide Risk Assessment  Discharge Assessment    [facility] Discharge Suicide Risk Assessment   Principal Problem: Schizoaffective disorder, bipolar type [facility]) Discharge Diagnoses: Principal Problem:   Schizoaffective disorder, bipolar type (HCC)   Total Time spent with patient: 20 minutes  Steven Richardson is a 42 y.o. male with a history of schizoaffective disorder bipolar type, who was initially admitted for inpatient psychiatric hospitalization on 09/16/2023 for management of psychosis.   During the patient's hospitalization, patient had extensive initial psychiatric evaluation, and follow-up psychiatric evaluations every day.  Psychiatric diagnoses provided upon initial assessment: Principal Problem:   Schizoaffective disorder, bipolar type (HCC)    The following medications were managed:   Upon admission, the following medications were changed / started / discontinued: -Start lithium ER 900 mg q12h for mood stabilization             -Level on 10/8 is 0.85 in the ED; on 10/19 was 0.93 -Start Haldol 10 mg BID for psychosis   During the patient's stay, the following medications were changed / started / discontinued, with final adjustments by discharge: - Gave haldol decanoate 100 mg on 10/19 - Continue Haldol 5 mg BID oral for 14 days Next Haldol decanoate LAI 100 mg is set for 4-7 days after 09/20/2023 Afterwards, Haldol dec LAI dose thereafter will be 200 mg every month   During the hospitalization, patient had the following lab / imaging / testing abnormalities which require further evaluation / management / treatment: none   Patient's care was discussed during the interdisciplinary team meeting every day during the hospitalization.   The patient denies any side effects to prescribed psychiatric medication.  Gradually, patient started adjusting to milieu. The patient was evaluated each day by a clinical provider to ascertain response to treatment. Improvement was noted by the  patient's report of decreasing symptoms, improved sleep and appetite, affect, medication tolerance, behavior, and participation in unit programming.  Patient was asked each day to complete a self inventory noting mood, mental status, pain, new symptoms, anxiety and concerns.    Symptoms were reported as significantly decreased or resolved completely by discharge.   On day of discharge, the patient reports that their mood is stable. The patient denied having suicidal thoughts for more than 48 hours prior to discharge.  Patient denies having homicidal thoughts.  Patient denies having auditory hallucinations.  Patient denies any visual hallucinations or other symptoms of psychosis. The patient was motivated to continue taking medication with a goal of continued improvement in mental health.   The patient reports their target psychiatric symptoms of paranoia and mania  responded well to the psychiatric medications, and the patient reports overall benefit other psychiatric hospitalization. Supportive psychotherapy was provided to the patient. The patient also participated in regular group therapy while hospitalized. Coping skills, problem solving as well as relaxation therapies were also part of the unit programming.  Labs were reviewed with the patient, and abnormal results were discussed with the patient.  The patient is able to verbalize their individual safety plan to this provider.  # It is recommended to the patient to continue psychiatric medications as prescribed, after discharge from the hospital.    # It is recommended to the patient to follow up with your outpatient psychiatric provider and PCP.  # It was discussed with the patient, the impact of alcohol, drugs, tobacco have been there overall psychiatric and medical wellbeing, and total abstinence from substance use was recommended the patient.ed.  # Prescriptions provided or sent  directly to preferred pharmacy at discharge. Patient  agreeable to plan. Given opportunity to ask questions. Appears to feel comfortable with discharge.    # In the event of worsening symptoms, the patient is instructed to call the crisis hotline, 911 and or go to the nearest ED for appropriate evaluation and treatment of symptoms. To follow-up with primary care provider for other medical issues, concerns and or health care needs  # Patient was discharged home with a plan to follow up as noted below.    Musculoskeletal: Strength & Muscle Tone: within normal limits Gait & Station: normal Patient leans: N/A  Psychiatric Specialty Exam  General Appearance: appears at stated age, in scrubs and groomed    Behavior: pleasant and cooperative    Psychomotor Activity: no psychomotor agitation or retardation noted    Eye Contact: fair  Speech: normal amount, tone, volume and fluency      Mood: euthymic  Affect: congruent, pleasant and interactive    Thought Process: linear, goal directed, no circumstantial or tangential thought process noted, no racing thoughts or flight of ideas  Descriptions of Associations: intact  Thought Content: no bizarre content, logical and future-oriented  Hallucinations: denies AH, VH , does not appear responding to stimuli  Delusions: no paranoia, delusions of control, grandeur, ideas of reference, thought broadcasting, and magical thinking  Suicidal Thoughts: denies SI, intention, plan  Homicidal Thoughts: denies HI, intention, plan    Alertness/Orientation: alert and fully oriented    Insight: fair, improved  Judgment: fair, improved    Memory: intact    Executive Functions  Concentration: intact  Attention Span: fair  Recall: intact  Fund of Knowledge: fair      Physical Exam  General: Pleasant, well-appearing. No acute distress. Pulmonary: Normal effort. No wheezing or rales. Skin: No obvious rash or lesions. Neuro: A&Ox3.No focal deficit.     Review of Systems  No reported symptoms Blood  pressure (!) 122/92, pulse 72, temperature 98.1 F (36.7 C), temperature source Oral, resp. rate 16, SpO2 100%. There is no height or weight on file to calculate BMI.  Mental Status Per Nursing Assessment::   On Admission:  NA  Demographic Factors:  Male, Divorced or widowed, and Low socioeconomic status Loss Factors: Loss of significant relationship Historical Factors: Impulsivity Risk Reduction Factors:   Positive coping skills or problem solving skills   Continued Clinical Symptoms:  Schizophrenia  Cognitive Features That Contribute To Risk:  Closed-mindedness    Suicide Risk:  Mild: There are no identifiable suicide plans, no associated intent, mild dysphoria and related symptoms, good self-control (both objective and subjective assessment), few other risk factors, and identifiable protective factors, including available and accessible social support.    Follow-up Information     [facility] Follow up on 09/25/2023.   Why: You have a hospital follow up appointment for therapy and medication management services on 09/25/23 at 12:00 pm.  This will be a Virtual telehealth appt. Contact information: [address]  [address] [place] [address] [phone]                 Plan Of Care/Follow-up recommendations:   Next Haldol decanoate LAI 100 mg is set for 4-7 days after 09/20/2023 Afterwards, Haldol dec LAI dose thereafter will be 200 mg every month   Activity: as tolerated   Diet: heart healthy   # It is recommended to the patient to continue psychiatric medications as prescribed, after discharge from the hospital.     # It  is recommended to the patient to follow up with your outpatient psychiatric provider -instructions on appointment date, time, and address (location) are provided to you in discharge paperwork   # Follow-up with outpatient primary care doctor and other specialists -for management of chronic medical disease, including: none   #  Testing: Follow-up with outpatient provider for abnormal lab results: none   # It was discussed with the patient, the impact of alcohol, drugs, tobacco have been there overall psychiatric and medical wellbeing, and total abstinence from substance use was recommended to the patient.   # Prescriptions provided or sent directly to preferred pharmacy at discharge. Patient agreeable to plan. Given opportunity to ask questions. Appears to feel comfortable with discharge.    # In the event of worsening symptoms, the patient is instructed to call the crisis hotline, 911, and or go to the nearest ED for appropriate evaluation and treatment of symptoms. To follow-up with primary care provider for other medical issues, concerns and or health care needs     Lance Muss, MD 09/21/2023, 9:35 AM

## 2023-10-08 ENCOUNTER — Ambulatory Visit: Payer: Self-pay

## 2023-10-08 NOTE — Telephone Encounter (Signed)
     Chief Complaint: Pt. Will need refill on all medications soon. No longer has PCP or [facility] provider. Will go to Mobile [facility]. Symptoms: n/a Frequency: n/a Pertinent Negatives: Patient denies  Disposition: [] ED /[] Urgent Care (no appt availability in office) / [] Appointment(In office/virtual)/ []  [facility]/ [] Home Care/ [] Refused Recommended Disposition /[x] [facility]/ []  Follow-up with PCP Additional Notes: Agrees with Mobile Unit.  Reason for Disposition  [1] Prescription refill request for ESSENTIAL medicine (i.e., likelihood of harm to patient if not taken) AND [2] triager unable to refill per department policy  Answer Assessment - Initial Assessment Questions 1. DRUG NAME: "What medicine do you need to have refilled?"     "All of my medications." 2. REFILLS REMAINING: "How many refills are remaining?" (Note: The label on the medicine or pill bottle will show how many refills are remaining. If there are no refills remaining, then a renewal may be needed.)     0 3. EXPIRATION DATE: "What is the expiration date?" (Note: The label states when the prescription will expire, and thus can no longer be refilled.)     N/a 4. PRESCRIBING HCP: "Who prescribed it?" Reason: If prescribed by specialist, call should be referred to that group.     No longer has a PCP 5. SYMPTOMS: "Do you have any symptoms?"     N/A 6. PREGNANCY: "Is there any chance that you are pregnant?" "When was your last menstrual period?"     N/a  Protocols used: Medication Refill and Renewal Call-A-AH

## 2023-10-09 ENCOUNTER — Ambulatory Visit: Payer: Medicare HMO | Admitting: Physician Assistant

## 2023-10-09 ENCOUNTER — Encounter: Payer: Self-pay | Admitting: Physician Assistant

## 2023-10-09 VITALS — BP 110/75 | HR 99 | Ht 70.0 in | Wt 256.0 lb

## 2023-10-09 DIAGNOSIS — I10 Essential (primary) hypertension: Secondary | ICD-10-CM

## 2023-10-09 DIAGNOSIS — F250 Schizoaffective disorder, bipolar type: Secondary | ICD-10-CM

## 2023-10-09 MED ORDER — LISINOPRIL 5 MG PO TABS: 5.0000 mg | ORAL_TABLET | Freq: Every day | ORAL | 1 refills | Status: AC

## 2023-10-09 NOTE — Patient Instructions (Addendum)
I sent a refill of your lisinopril to your pharmacy.  I do encourage you to present to behavioral health urgent care or reach out to [facility] for refills of your behavioral health medications.  [facility] behavioral health urgent care  Phone:  [phone]  Address:  [address]  [place], [place] [address]  Hours:  Open 24/7, No appointment required.  [facility] [address]  [address] [place] [address] [phone]  [facility] [place]Day Access Hours:  Monday - Friday, 8:30am - 3:00pm Walk-In Crisis Hours: Monday - Friday, 8am - 8pm [address] [place], [place] [address]  Please make sure to maintain follow-up with your primary care provider on November 11.  Roney Jaffe, PA-C Physician Assistant [facility] [email or web address]

## 2023-10-09 NOTE — Progress Notes (Signed)
New Patient Office Visit  Subjective    Patient ID: Steven Richardson, male    DOB: 1981/08/21  Age: 42 y.o. MRN: [id number]  CC:  Chief Complaint  Patient presents with   Medication Refill    Patient has been out of medications for 3 days     HPI Steven Richardson states that he was recently hospitalized from October 15 through 20.  Note from that visit      Attestation signed by Phineas Inches, MD at 09/21/2023 10:44 AM   Total Time Spent in Direct Patient Care:  I personally spent 35 minutes on the unit in direct patient care. The direct patient care time included face-to-face time with the patient, reviewing the patient's chart, communicating with other professionals, and coordinating care. Greater than 50% of this time was spent in counseling or coordinating care with the patient regarding goals of hospitalization, psycho-education, and discharge planning needs.   On my assessment the patient denied SI, HI, AVH, paranoia, ideas of reference, or first rank symptoms on day of discharge. Patient denied drug cravings or active signs of withdrawal. Patient denied medication side-effects. Patient was not deemed to be a danger to self or others on day of discharge and was in agreement with discharge plans.    I have independently evaluated the patient during a face-to-face assessment on the day of discharge. I reviewed the patient's chart, and I participated in key portions of the service. I discussed the case with the resident physician, and I agree with the assessment and plan of care as documented in the resident physician's note, as addended by me or notated below:   Agree with dc summary and plan. Pt given instructions for next haldol dec dose, and tapering of oral haldol.  Aims score zero on my exam. No eps on my exam.    Phineas Inches, MD Psychiatrist             Date of Admission:  09/16/2023 Date of Discharge: 09/21/2023   Reason for Admission:  psychosis      Hospital Course:   During the patient's hospitalization, patient had extensive initial psychiatric evaluation, and follow-up psychiatric evaluations every day.   Psychiatric diagnoses provided upon initial assessment: Principal Problem:   Schizoaffective disorder, bipolar type (HCC)    The following medications were managed:   Upon admission, the following medications were changed / started / discontinued: -Start lithium ER 900 mg q12h for mood stabilization             -Level on 10/8 is 0.85 in the ED; on 10/19 was 0.93 -Start Haldol 10 mg BID for psychosis         States today that he unfortunately missed his virtual appointment with [facility] due to being sick.  States that he has been out of his medication for the past 2 days, today with day 3.  States that he has an upcoming appointment with his primary care provider at [facility] on November 11.    Outpatient Encounter Medications as of 10/09/2023  Medication Sig   atorvastatin (LIPITOR) 20 MG tablet Take 20 mg by mouth daily.   JARDIANCE 25 MG TABS tablet Take 25 mg by mouth daily.   lithium carbonate (ESKALITH) 450 MG ER tablet Take 2 tablets (900 mg total) by mouth every 12 (twelve) hours.   [DISCONTINUED] lisinopril (ZESTRIL) 5 MG tablet Take 5 mg by mouth daily.   haloperidol (HALDOL) 5 MG tablet Take 1 tablet (5 mg total) by  mouth 2 (two) times daily for 14 days.   haloperidol decanoate (HALDOL DECANOATE) 100 MG/ML injection Inject 1 mL (100 mg total) into the muscle every 30 (thirty) days for 1 dose. Next dose administration is due 09-26-2023.   hydrOXYzine (ATARAX) 25 MG tablet Take 1 tablet (25 mg total) by mouth 3 (three) times daily as needed for anxiety. (Patient not taking: Reported on 10/09/2023)   lisinopril (ZESTRIL) 5 MG tablet Take 1 tablet (5 mg total) by mouth daily.   traZODone (DESYREL) 50 MG tablet Take 1 tablet (50 mg total) by mouth at bedtime as needed for sleep. (Patient not taking: Reported on  10/09/2023)   No facility-administered encounter medications on file as of 10/09/2023.    Past Medical History:  Diagnosis Date   Constipation    Diabetes mellitus without complication (HCC)    Hyperlipemia    Hypertension    Paranoid schizophrenia (HCC)     Past Surgical History:  Procedure Laterality Date   NO PAST SURGERIES      Family History  Problem Relation Age of Onset   Healthy Mother    Diabetes Mother    Healthy Father    Colon cancer Neg Hx    Esophageal cancer Neg Hx    Stomach cancer Neg Hx    Pancreatic cancer Neg Hx     Social History   Socioeconomic History   Marital status: Single    Spouse name: Not on file   Number of children: Not on file   Years of education: Not on file   Highest education level: Not on file  Occupational History   Not on file  Tobacco Use   Smoking status: Every Day    Types: Cigarettes    Passive exposure: Current   Smokeless tobacco: Never  Vaping Use   Vaping status: Never Used  Substance and Sexual Activity   Alcohol use: No   Drug use: No   Sexual activity: Yes  Other Topics Concern   Not on file  Social History Narrative   Not on file   Social Determinants of Health   Financial Resource Strain: Medium Risk (12/27/2022)   Received from [facility]   Overall Financial Resource Strain (CARDIA)    Difficulty of Paying Living Expenses: Somewhat hard  Food Insecurity: No Food Insecurity (09/16/2023)   Hunger Vital Sign    Worried About Running Out of Food in the Last Year: Never true    Ran Out of Food in the Last Year: Never true  Transportation Needs: No Transportation Needs (09/16/2023)   PRAPARE - Administrator, Civil Service (Medical): No    Lack of Transportation (Non-Medical): No  Physical Activity: Insufficiently Active (02/13/2022)   Received from [facility]   Exercise Vital Sign    Days of Exercise per Week: 3 days    Minutes of Exercise per Session: 30 min  Stress: Stress  Concern Present (05/04/2023)   Received from [facility] - Occupational Stress Questionnaire    Feeling of Stress : Very much  Social Connections: Unknown (02/14/2023)   Received from [facility]   Social Network    Social Network: Not on file  Intimate Partner Violence: Not At Risk (09/16/2023)   Humiliation, Afraid, Rape, and Kick questionnaire    Fear of Current or Ex-Partner: No    Emotionally Abused: No    Physically Abused: No    Sexually Abused: No    Review  of Systems  Constitutional: Negative.   HENT: Negative.    Eyes: Negative.   Respiratory:  Negative for shortness of breath.   Cardiovascular:  Negative for chest pain.  Gastrointestinal: Negative.   Genitourinary: Negative.   Musculoskeletal: Negative.   Skin: Negative.   Neurological: Negative.   Endo/Heme/Allergies: Negative.   Psychiatric/Behavioral:  Negative for depression and hallucinations. The patient is not nervous/anxious and does not have insomnia.         Objective    BP 110/75 (BP Location: Left Arm, Patient Position: Sitting, Cuff Size: Large)   Pulse 99   Ht 5\' 10"  (1.778 m)   Wt 256 lb (116.1 kg)   SpO2 96%   BMI 36.73 kg/m   Physical Exam Vitals and nursing note reviewed.  Constitutional:      Appearance: Normal appearance.  HENT:     Head: Normocephalic and atraumatic.     Right Ear: External ear normal.     Left Ear: External ear normal.     Nose: Nose normal.     Mouth/Throat:     Mouth: Mucous membranes are moist.     Pharynx: Oropharynx is clear.  Eyes:     Extraocular Movements: Extraocular movements intact.     Conjunctiva/sclera: Conjunctivae normal.     Pupils: Pupils are equal, round, and reactive to light.  Cardiovascular:     Rate and Rhythm: Normal rate and regular rhythm.     Pulses: Normal pulses.     Heart sounds: Normal heart sounds.  Pulmonary:     Effort: Pulmonary effort is normal.     Breath sounds: Normal  breath sounds.  Musculoskeletal:        General: Normal range of motion.     Cervical back: Normal range of motion and neck supple.  Skin:    General: Skin is warm and dry.  Neurological:     General: No focal deficit present.     Mental Status: He is alert and oriented to person, place, and time.  Psychiatric:        Mood and Affect: Mood normal.        Behavior: Behavior normal.        Thought Content: Thought content normal.        Judgment: Judgment normal.         Assessment & Plan:   Problem List Items Addressed This Visit       Other   Schizoaffective disorder, bipolar type (HCC) - Primary   Other Visit Diagnoses     Essential hypertension       Relevant Medications   atorvastatin (LIPITOR) 20 MG tablet   lisinopril (ZESTRIL) 5 MG tablet      1. Schizoaffective disorder, bipolar type [facility]) Patient encouraged to follow-up with his pharmacy, it does appear that he has prescriptions available for his medications.  Patient also given information on 3 different behavioral health urgent cares for refills and psychiatric care.  2. Essential hypertension Courtesy refill given.  Patient encouraged to check blood pressure at home, keep a written log and have available for all office visits.  Patient encouraged to continue follow-up with primary care provider. - lisinopril (ZESTRIL) 5 MG tablet; Take 1 tablet (5 mg total) by mouth daily.  Dispense: 30 tablet; Refill: 1   I have reviewed the patient's medical history (PMH, PSH, Social History, Family History, Medications, and allergies) , and have been updated if relevant. I spent 20 minutes reviewing chart and  face to  face time with patient.    Return if symptoms worsen or fail to improve.   Steven Knudsen Mayers, PA-C

## 2023-12-13 ENCOUNTER — Other Ambulatory Visit: Payer: Self-pay | Admitting: Physician Assistant

## 2023-12-13 DIAGNOSIS — I10 Essential (primary) hypertension: Secondary | ICD-10-CM
